# Patient Record
Sex: Male | Born: 1965
Health system: Southern US, Community
[De-identification: ages and names within clinical notes are randomized; demographics above are authoritative.]

## PROBLEM LIST (undated history)

## (undated) DIAGNOSIS — K219 Gastro-esophageal reflux disease without esophagitis: Secondary | ICD-10-CM

## (undated) DIAGNOSIS — R05 Cough: Secondary | ICD-10-CM

## (undated) DIAGNOSIS — B20 Human immunodeficiency virus [HIV] disease: Secondary | ICD-10-CM

## (undated) DIAGNOSIS — I739 Peripheral vascular disease, unspecified: Secondary | ICD-10-CM

## (undated) DIAGNOSIS — Z21 Asymptomatic human immunodeficiency virus [HIV] infection status: Secondary | ICD-10-CM

## (undated) HISTORY — DX: Peripheral vascular disease, unspecified: I73.9

---

## 2001-11-23 ENCOUNTER — Emergency Department (HOSPITAL_COMMUNITY): Admission: EM | Admit: 2001-11-23 | Discharge: 2001-11-24 | Payer: Self-pay | Admitting: Emergency Medicine

## 2001-11-23 ENCOUNTER — Encounter: Payer: Self-pay | Admitting: Emergency Medicine

## 2003-01-16 ENCOUNTER — Emergency Department (HOSPITAL_COMMUNITY): Admission: EM | Admit: 2003-01-16 | Discharge: 2003-01-16 | Payer: Self-pay | Admitting: Emergency Medicine

## 2003-03-23 ENCOUNTER — Emergency Department (HOSPITAL_COMMUNITY): Admission: EM | Admit: 2003-03-23 | Discharge: 2003-03-24 | Payer: Self-pay | Admitting: Emergency Medicine

## 2003-09-18 ENCOUNTER — Emergency Department (HOSPITAL_COMMUNITY): Admission: EM | Admit: 2003-09-18 | Discharge: 2003-09-19 | Payer: Self-pay | Admitting: Emergency Medicine

## 2003-09-18 ENCOUNTER — Encounter: Payer: Self-pay | Admitting: Emergency Medicine

## 2004-12-04 ENCOUNTER — Emergency Department (HOSPITAL_COMMUNITY): Admission: EM | Admit: 2004-12-04 | Discharge: 2004-12-04 | Payer: Self-pay | Admitting: Emergency Medicine

## 2007-10-16 ENCOUNTER — Emergency Department (HOSPITAL_COMMUNITY): Admission: EM | Admit: 2007-10-16 | Discharge: 2007-10-16 | Payer: Self-pay | Admitting: Emergency Medicine

## 2011-03-01 ENCOUNTER — Emergency Department (HOSPITAL_COMMUNITY)
Admission: EM | Admit: 2011-03-01 | Discharge: 2011-03-01 | Disposition: A | Discharge status: Home / Self Care | Payer: Self-pay | Attending: Emergency Medicine | Admitting: Emergency Medicine

## 2011-03-01 ENCOUNTER — Emergency Department (HOSPITAL_COMMUNITY): Payer: Self-pay

## 2011-03-01 DIAGNOSIS — R0602 Shortness of breath: Secondary | ICD-10-CM | POA: Insufficient documentation

## 2011-03-01 DIAGNOSIS — R197 Diarrhea, unspecified: Secondary | ICD-10-CM | POA: Insufficient documentation

## 2011-03-01 DIAGNOSIS — R112 Nausea with vomiting, unspecified: Secondary | ICD-10-CM | POA: Insufficient documentation

## 2011-03-01 DIAGNOSIS — R0989 Other specified symptoms and signs involving the circulatory and respiratory systems: Secondary | ICD-10-CM | POA: Insufficient documentation

## 2011-03-01 DIAGNOSIS — R1013 Epigastric pain: Secondary | ICD-10-CM | POA: Insufficient documentation

## 2011-03-01 DIAGNOSIS — R0609 Other forms of dyspnea: Secondary | ICD-10-CM | POA: Insufficient documentation

## 2011-03-01 DIAGNOSIS — R079 Chest pain, unspecified: Secondary | ICD-10-CM | POA: Insufficient documentation

## 2011-03-01 LAB — POCT I-STAT, CHEM 8
BUN: 8 mg/dL (ref 6–23)
Calcium, Ion: 1.04 mmol/L — ABNORMAL LOW (ref 1.12–1.32)
Chloride: 109 mEq/L (ref 96–112)
Creatinine, Ser: 1.2 mg/dL (ref 0.4–1.5)
Glucose, Bld: 119 mg/dL — ABNORMAL HIGH (ref 70–99)
HCT: 50 % (ref 39.0–52.0)
Hemoglobin: 17 g/dL (ref 13.0–17.0)
Potassium: 3.9 mEq/L (ref 3.5–5.1)
Sodium: 139 mEq/L (ref 135–145)
TCO2: 20 mmol/L (ref 0–100)

## 2011-03-01 LAB — POCT CARDIAC MARKERS
CKMB, poc: 2.4 ng/mL (ref 1.0–8.0)
Myoglobin, poc: 216 ng/mL (ref 12–200)

## 2011-03-01 LAB — HEPATIC FUNCTION PANEL
Albumin: 4.1 g/dL (ref 3.5–5.2)
Alkaline Phosphatase: 86 U/L (ref 39–117)
Bilirubin, Direct: 0.2 mg/dL (ref 0.0–0.3)
Total Bilirubin: 0.9 mg/dL (ref 0.3–1.2)

## 2011-03-01 LAB — LIPASE, BLOOD: Lipase: 29 U/L (ref 11–59)

## 2011-03-01 LAB — CBC
MCH: 31.6 pg (ref 26.0–34.0)
MCV: 89.5 fL (ref 78.0–100.0)
Platelets: 224 10*3/uL (ref 150–400)
RBC: 5.13 MIL/uL (ref 4.22–5.81)
RDW: 14.3 % (ref 11.5–15.5)

## 2011-03-01 LAB — DIFFERENTIAL
Basophils Relative: 0 % (ref 0–1)
Eosinophils Absolute: 0.2 10*3/uL (ref 0.0–0.7)
Eosinophils Relative: 2 % (ref 0–5)
Lymphs Abs: 3.8 10*3/uL (ref 0.7–4.0)
Monocytes Relative: 5 % (ref 3–12)
Neutrophils Relative %: 64 % (ref 43–77)

## 2011-06-22 ENCOUNTER — Emergency Department (HOSPITAL_COMMUNITY)
Admission: EM | Admit: 2011-06-22 | Discharge: 2011-06-23 | Disposition: A | Discharge status: Home / Self Care | Payer: Self-pay | Attending: Emergency Medicine | Admitting: Emergency Medicine

## 2011-06-22 ENCOUNTER — Emergency Department (HOSPITAL_COMMUNITY): Payer: Self-pay

## 2011-06-22 DIAGNOSIS — Z79899 Other long term (current) drug therapy: Secondary | ICD-10-CM | POA: Insufficient documentation

## 2011-06-22 DIAGNOSIS — R079 Chest pain, unspecified: Secondary | ICD-10-CM | POA: Insufficient documentation

## 2011-06-22 DIAGNOSIS — F17200 Nicotine dependence, unspecified, uncomplicated: Secondary | ICD-10-CM | POA: Insufficient documentation

## 2011-06-22 DIAGNOSIS — K219 Gastro-esophageal reflux disease without esophagitis: Secondary | ICD-10-CM | POA: Insufficient documentation

## 2011-06-22 LAB — DIFFERENTIAL
Basophils Relative: 0 % (ref 0–1)
Eosinophils Absolute: 0.1 10*3/uL (ref 0.0–0.7)
Eosinophils Relative: 0 % (ref 0–5)
Lymphs Abs: 2.6 10*3/uL (ref 0.7–4.0)
Monocytes Absolute: 0.6 10*3/uL (ref 0.1–1.0)
Monocytes Relative: 5 % (ref 3–12)

## 2011-06-22 LAB — POCT I-STAT, CHEM 8
BUN: 7 mg/dL (ref 6–23)
Calcium, Ion: 1.1 mmol/L — ABNORMAL LOW (ref 1.12–1.32)
Chloride: 103 mEq/L (ref 96–112)
Creatinine, Ser: 1.1 mg/dL (ref 0.50–1.35)
Glucose, Bld: 122 mg/dL — ABNORMAL HIGH (ref 70–99)
TCO2: 17 mmol/L (ref 0–100)

## 2011-06-22 LAB — CBC
MCH: 33.9 pg (ref 26.0–34.0)
MCHC: 36.4 g/dL — ABNORMAL HIGH (ref 30.0–36.0)
MCV: 93.2 fL (ref 78.0–100.0)
Platelets: 171 10*3/uL (ref 150–400)
RBC: 5.31 MIL/uL (ref 4.22–5.81)
RDW: 14.8 % (ref 11.5–15.5)

## 2011-06-22 LAB — TROPONIN I: Troponin I: <0.3 ng/mL (ref <0.30)

## 2011-08-03 ENCOUNTER — Emergency Department (HOSPITAL_COMMUNITY)
Admission: EM | Admit: 2011-08-03 | Discharge: 2011-08-04 | Disposition: A | Discharge status: Home / Self Care | Payer: Self-pay | Attending: Emergency Medicine | Admitting: Emergency Medicine

## 2011-08-03 DIAGNOSIS — K219 Gastro-esophageal reflux disease without esophagitis: Secondary | ICD-10-CM | POA: Insufficient documentation

## 2011-08-03 DIAGNOSIS — R112 Nausea with vomiting, unspecified: Secondary | ICD-10-CM | POA: Insufficient documentation

## 2011-08-03 DIAGNOSIS — Z79899 Other long term (current) drug therapy: Secondary | ICD-10-CM | POA: Insufficient documentation

## 2011-08-03 DIAGNOSIS — R109 Unspecified abdominal pain: Secondary | ICD-10-CM | POA: Insufficient documentation

## 2011-08-03 DIAGNOSIS — R61 Generalized hyperhidrosis: Secondary | ICD-10-CM | POA: Insufficient documentation

## 2011-08-03 DIAGNOSIS — R0609 Other forms of dyspnea: Secondary | ICD-10-CM | POA: Insufficient documentation

## 2011-08-03 DIAGNOSIS — R079 Chest pain, unspecified: Secondary | ICD-10-CM | POA: Insufficient documentation

## 2011-08-03 DIAGNOSIS — R0989 Other specified symptoms and signs involving the circulatory and respiratory systems: Secondary | ICD-10-CM | POA: Insufficient documentation

## 2011-08-04 ENCOUNTER — Emergency Department (HOSPITAL_COMMUNITY): Payer: Self-pay

## 2011-08-04 LAB — DIFFERENTIAL
Basophils Relative: 0 % (ref 0–1)
Lymphocytes Relative: 19 % (ref 12–46)
Monocytes Relative: 5 % (ref 3–12)
Neutro Abs: 8.9 10*3/uL — ABNORMAL HIGH (ref 1.7–7.7)
Neutrophils Relative %: 76 % (ref 43–77)

## 2011-08-04 LAB — CBC
HCT: 45.9 % (ref 39.0–52.0)
Hemoglobin: 16.6 g/dL (ref 13.0–17.0)
MCH: 34 pg (ref 26.0–34.0)
RBC: 4.88 MIL/uL (ref 4.22–5.81)

## 2011-08-04 LAB — COMPREHENSIVE METABOLIC PANEL
ALT: 16 U/L (ref 0–53)
Alkaline Phosphatase: 81 U/L (ref 39–117)
CO2: 23 mEq/L (ref 19–32)
Calcium: 9.3 mg/dL (ref 8.4–10.5)
GFR calc Af Amer: >60 mL/min (ref >60)
GFR calc non Af Amer: >60 mL/min (ref >60)
Glucose, Bld: 121 mg/dL — ABNORMAL HIGH (ref 70–99)
Potassium: 3.3 mEq/L — ABNORMAL LOW (ref 3.5–5.1)
Sodium: 131 mEq/L — ABNORMAL LOW (ref 135–145)

## 2011-08-05 ENCOUNTER — Emergency Department (HOSPITAL_COMMUNITY): Payer: Self-pay

## 2011-08-05 ENCOUNTER — Emergency Department (HOSPITAL_COMMUNITY)
Admission: EM | Admit: 2011-08-05 | Discharge: 2011-08-05 | Disposition: A | Discharge status: Home / Self Care | Payer: Self-pay | Attending: Emergency Medicine | Admitting: Emergency Medicine

## 2011-08-05 DIAGNOSIS — R112 Nausea with vomiting, unspecified: Secondary | ICD-10-CM | POA: Insufficient documentation

## 2011-08-05 DIAGNOSIS — R1013 Epigastric pain: Secondary | ICD-10-CM | POA: Insufficient documentation

## 2011-08-05 DIAGNOSIS — R197 Diarrhea, unspecified: Secondary | ICD-10-CM | POA: Insufficient documentation

## 2011-08-05 DIAGNOSIS — R509 Fever, unspecified: Secondary | ICD-10-CM | POA: Insufficient documentation

## 2011-08-05 DIAGNOSIS — R05 Cough: Secondary | ICD-10-CM | POA: Insufficient documentation

## 2011-08-05 DIAGNOSIS — R61 Generalized hyperhidrosis: Secondary | ICD-10-CM | POA: Insufficient documentation

## 2011-08-05 DIAGNOSIS — R059 Cough, unspecified: Secondary | ICD-10-CM | POA: Insufficient documentation

## 2011-08-05 DIAGNOSIS — K219 Gastro-esophageal reflux disease without esophagitis: Secondary | ICD-10-CM | POA: Insufficient documentation

## 2011-08-05 LAB — CBC
MCH: 31.8 pg (ref 26.0–34.0)
MCHC: 34 g/dL (ref 30.0–36.0)
MCV: 93.6 fL (ref 78.0–100.0)
Platelets: 159 10*3/uL (ref 150–400)
RDW: 14 % (ref 11.5–15.5)

## 2011-08-05 LAB — DIFFERENTIAL
Eosinophils Absolute: 0 10*3/uL (ref 0.0–0.7)
Eosinophils Relative: 0 % (ref 0–5)
Lymphs Abs: 2.6 10*3/uL (ref 0.7–4.0)
Monocytes Absolute: 0.6 10*3/uL (ref 0.1–1.0)
Monocytes Relative: 5 % (ref 3–12)

## 2011-08-05 LAB — COMPREHENSIVE METABOLIC PANEL
AST: 31 U/L (ref 0–37)
Albumin: 3.9 g/dL (ref 3.5–5.2)
CO2: 28 mEq/L (ref 19–32)
Calcium: 10.2 mg/dL (ref 8.4–10.5)
Creatinine, Ser: 0.93 mg/dL (ref 0.50–1.35)
GFR calc non Af Amer: >60 mL/min (ref >60)

## 2011-09-23 LAB — CBC
MCHC: 34.9
MCV: 92.7
Platelets: 164
RBC: 4.77

## 2011-09-23 LAB — BASIC METABOLIC PANEL
BUN: 11
CO2: 25
Chloride: 107
Creatinine, Ser: 1.04
Glucose, Bld: 106 — ABNORMAL HIGH

## 2011-09-23 LAB — DIFFERENTIAL
Basophils Relative: 0
Eosinophils Absolute: 0.3
Monocytes Relative: 7
Neutrophils Relative %: 67

## 2011-09-23 LAB — PROTIME-INR: Prothrombin Time: 12.9

## 2011-09-23 LAB — POCT CARDIAC MARKERS: CKMB, poc: <1 — ABNORMAL LOW

## 2012-12-08 IMAGING — CR DG ABDOMEN ACUTE W/ 1V CHEST
3 series · 3 of 3 positions shown · non-contrast
Comparison: Chest radiograph performed 06/22/2011

CLINICAL DATA: Nausea, vomiting and upper abdominal pain.

ACUTE ABDOMEN SERIES (ABDOMEN 2 VIEW & CHEST 1 VIEW)

[w chest pa]
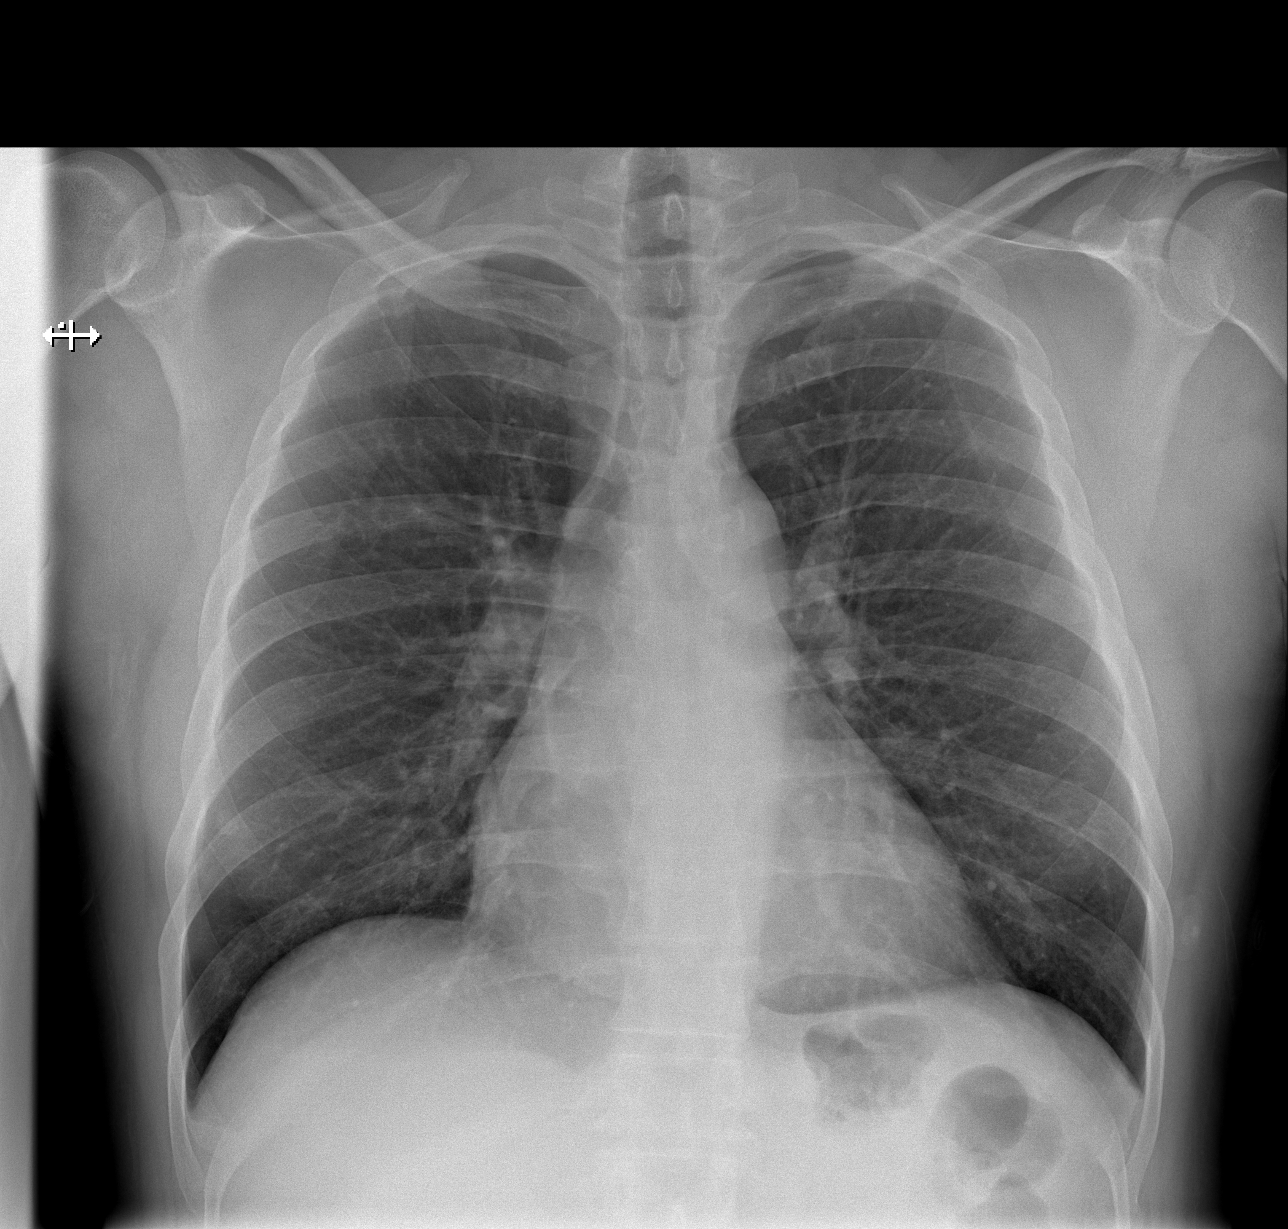

[w abdomen upright]
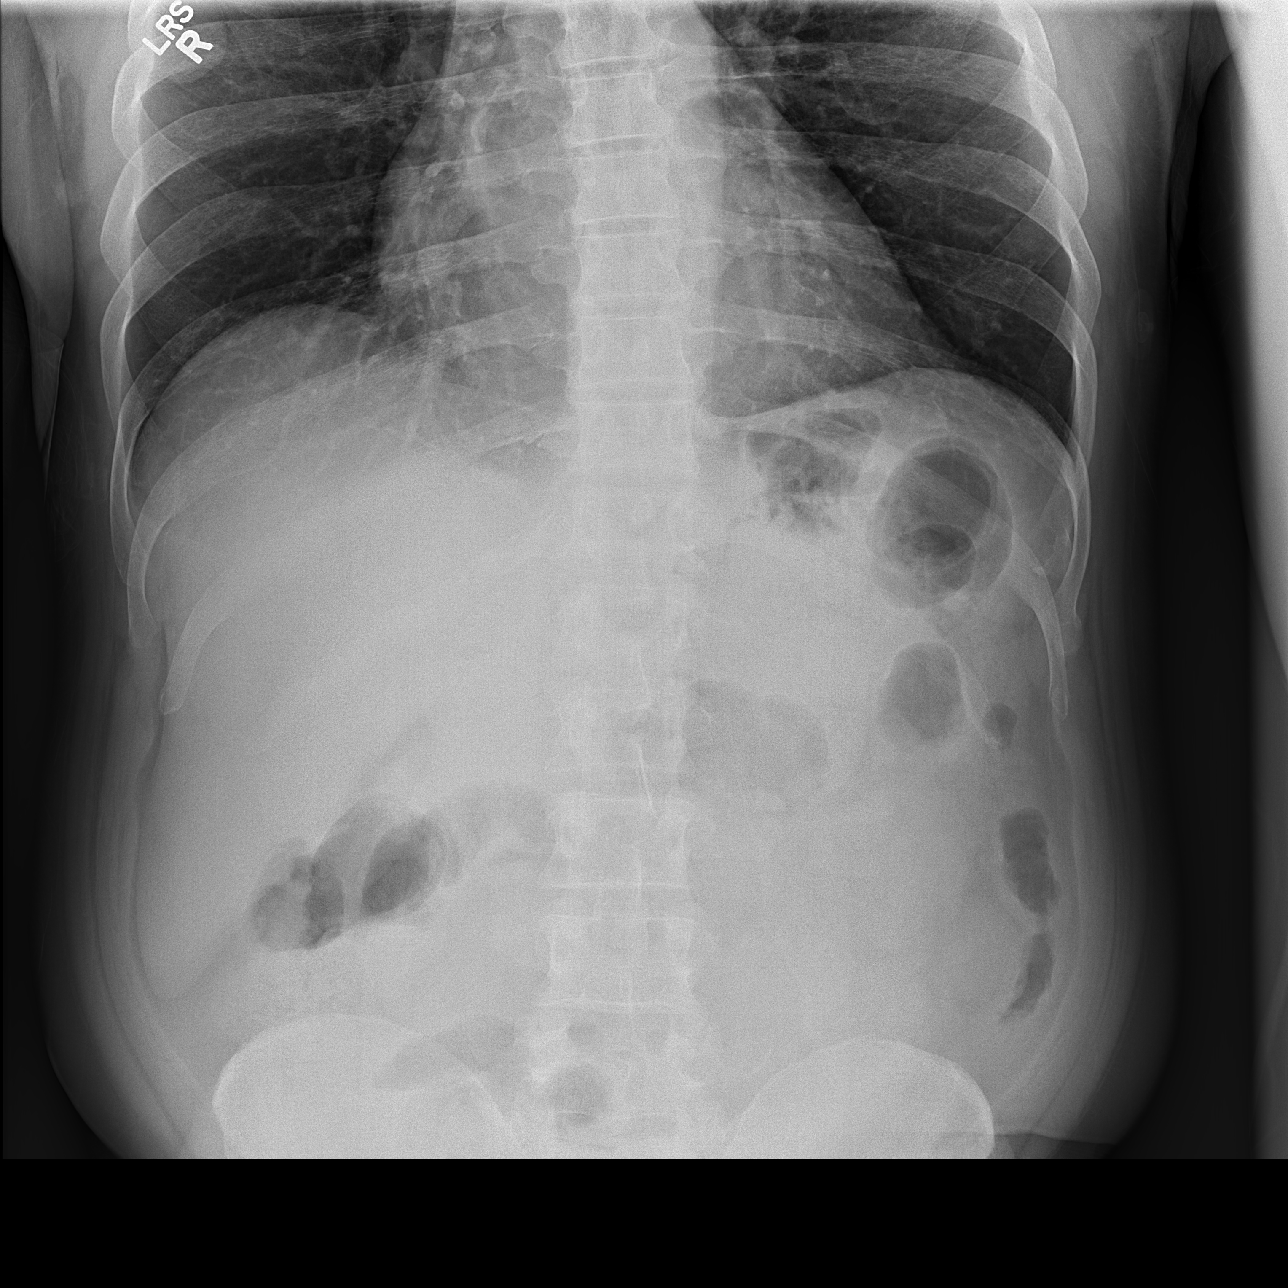

[t abdomen supine]
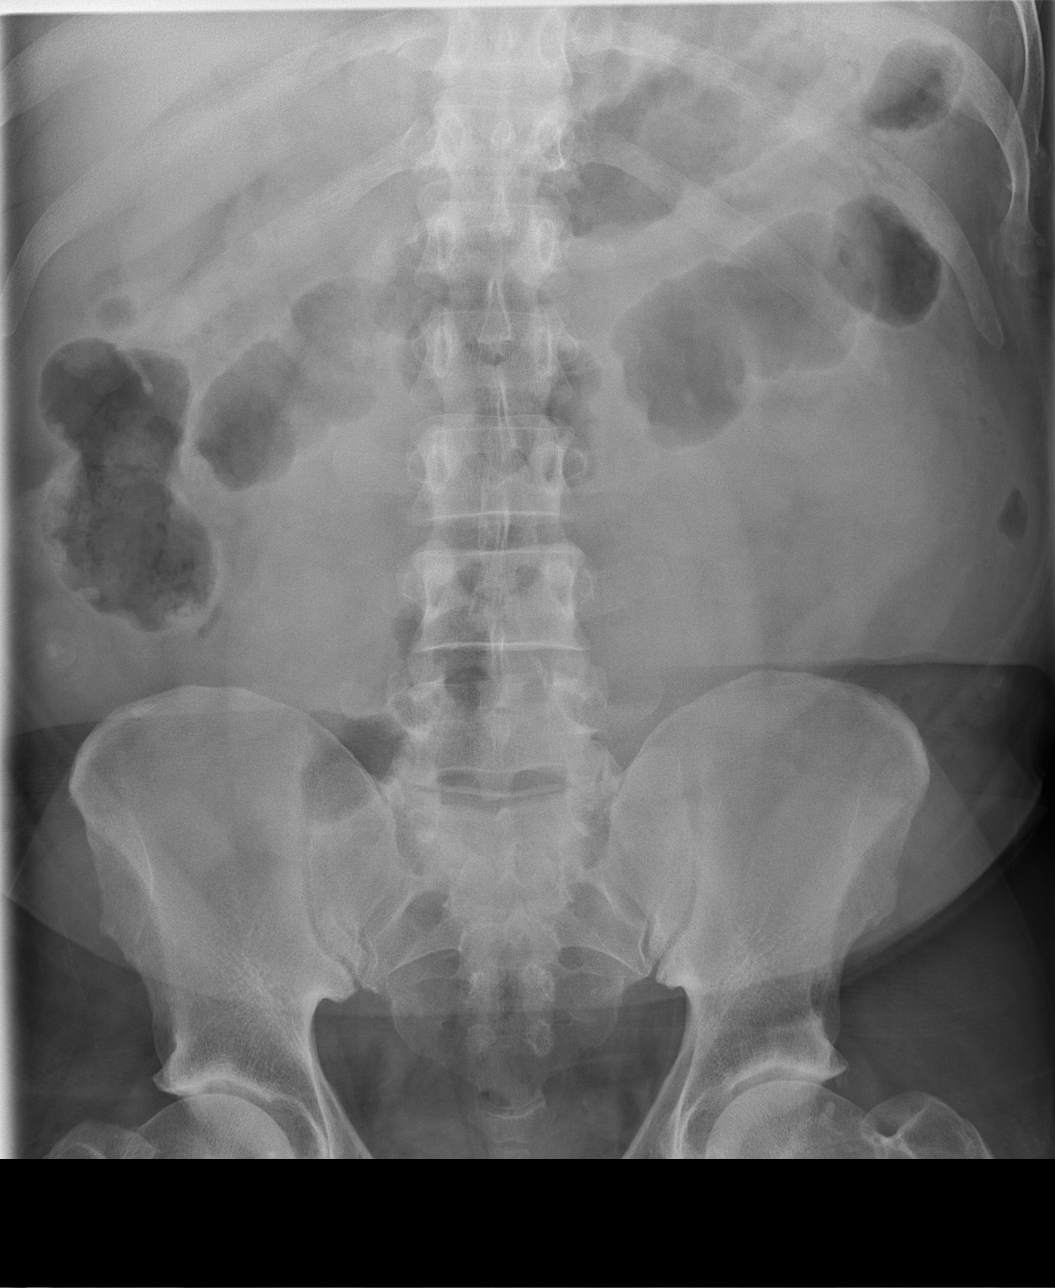

[3 of 3 positions shown; findings below may reference images not displayed]

FINDINGS: The lungs are well-aerated.  Mild peribronchial
thickening is noted.  There is no evidence of focal opacification,
pleural effusion or pneumothorax.  The cardiomediastinal silhouette
is within normal limits.  A rounded density at the right lung base
appears to reflect the right nipple.

The visualized bowel gas pattern is unremarkable.  Scattered air-
filled loops of small and large bowel are noted; there is no
evidence of small bowel dilatation to suggest obstruction.  No free
intra-abdominal air is identified on the provided upright view.

No acute osseous abnormalities are seen; the sacroiliac joints are
unremarkable in appearance.
IMPRESSION: 1.  Unremarkable bowel gas pattern; no free intra-abdominal air
seen.
2.  No acute cardiopulmonary process identified; mild peribronchial
thickening noted.

## 2013-05-26 ENCOUNTER — Emergency Department (HOSPITAL_COMMUNITY): Payer: Self-pay

## 2013-05-26 ENCOUNTER — Encounter (HOSPITAL_COMMUNITY): Payer: Self-pay | Admitting: Adult Health

## 2013-05-26 ENCOUNTER — Emergency Department (HOSPITAL_COMMUNITY)
Admission: EM | Admit: 2013-05-26 | Discharge: 2013-05-26 | Disposition: A | Discharge status: Home / Self Care | Payer: Self-pay | Attending: Emergency Medicine | Admitting: Emergency Medicine

## 2013-05-26 DIAGNOSIS — F17200 Nicotine dependence, unspecified, uncomplicated: Secondary | ICD-10-CM | POA: Insufficient documentation

## 2013-05-26 DIAGNOSIS — S6990XA Unspecified injury of unspecified wrist, hand and finger(s), initial encounter: Secondary | ICD-10-CM | POA: Insufficient documentation

## 2013-05-26 DIAGNOSIS — R209 Unspecified disturbances of skin sensation: Secondary | ICD-10-CM | POA: Insufficient documentation

## 2013-05-26 DIAGNOSIS — S59919A Unspecified injury of unspecified forearm, initial encounter: Secondary | ICD-10-CM | POA: Insufficient documentation

## 2013-05-26 DIAGNOSIS — Y9389 Activity, other specified: Secondary | ICD-10-CM | POA: Insufficient documentation

## 2013-05-26 DIAGNOSIS — S6991XA Unspecified injury of right wrist, hand and finger(s), initial encounter: Secondary | ICD-10-CM

## 2013-05-26 DIAGNOSIS — Y990 Civilian activity done for income or pay: Secondary | ICD-10-CM | POA: Insufficient documentation

## 2013-05-26 DIAGNOSIS — Y9289 Other specified places as the place of occurrence of the external cause: Secondary | ICD-10-CM | POA: Insufficient documentation

## 2013-05-26 DIAGNOSIS — X500XXA Overexertion from strenuous movement or load, initial encounter: Secondary | ICD-10-CM | POA: Insufficient documentation

## 2013-05-26 DIAGNOSIS — Z8719 Personal history of other diseases of the digestive system: Secondary | ICD-10-CM | POA: Insufficient documentation

## 2013-05-26 DIAGNOSIS — S59909A Unspecified injury of unspecified elbow, initial encounter: Secondary | ICD-10-CM | POA: Insufficient documentation

## 2013-05-26 HISTORY — DX: Gastro-esophageal reflux disease without esophagitis: K21.9

## 2013-05-26 MED ORDER — IBUPROFEN 200 MG PO TABS
400.0000 mg | ORAL_TABLET | Freq: Once | ORAL | Status: AC
  Administered 2013-05-26: 400 mg via ORAL
  Filled 2013-05-26: qty 2

## 2013-05-26 MED ORDER — NAPROXEN 500 MG PO TABS: 500.0000 mg | ORAL_TABLET | Freq: Two times a day (BID) | ORAL | Status: DC

## 2013-05-26 MED ORDER — OXYCODONE-ACETAMINOPHEN 5-325 MG PO TABS
1.0000 | ORAL_TABLET | Freq: Once | ORAL | Status: AC
  Administered 2013-05-26: 1 via ORAL
  Filled 2013-05-26: qty 1

## 2013-05-26 MED ORDER — HYDROCODONE-ACETAMINOPHEN 5-325 MG PO TABS: 1.0000 | ORAL_TABLET | ORAL | Status: DC | PRN

## 2013-05-26 NOTE — ED Notes (Signed)
Presents with right wrist injury, wrist injury occurred today while using a wheel barrel and pt staes, "I felt my wrist pop" CMS intact, pt able to move wrist well. Deformity noted to anterior surface.

## 2013-05-26 NOTE — ED Notes (Signed)
The pt injured his wrist this am at work  Swelling and pain to the rt wrist palmar surface

## 2013-05-26 NOTE — Progress Notes (Signed)
Orthopedic Tech Progress Note Patient Details:  Gregory Grant [birth year] [id number]  Ortho Devices Type of Ortho Device: Thumb velcro splint Ortho Device/Splint Location: RUE Ortho Device/Splint Interventions: Application;Ordered   Jennye Moccasin 05/26/2013, 10:30 PM

## 2013-05-26 NOTE — ED Provider Notes (Signed)
History    This chart was scribed for non-physician practitioner Gregory Grant working with Gregory Human, MD by Gregory Grant, ED Scribe. This patient was seen in room TR09C/TR09C and the patient's care was started at 10:20 PM .   CSN: [id number]  Arrival date & time 05/26/13  2055      Chief Complaint  Patient presents with  . Wrist Injury     The history is provided by the patient. No language interpreter was used.    HPI Comments: Gregory Grant is a 47 y.o. male who presents to the Emergency Department complaining of a right wrist injury that he sustained 12 hours ago.  Pt states that at that time he was pulling a wheelbarrow full of dirt and felt the wrist pop.  4 hours later he noted a visible knot to the right wrist.  Presently he reports pain and tingling to the wrist that is exacerbated by moving the hand.  He denies h/o injury to that wrist.  He denies chronic medical conditions except for GERD and denies regular medicine usage.  He notes that he saw an orthopedist some time ago for a fractured finger to the right hand.   Past Medical History  Diagnosis Date  . GERD (gastroesophageal reflux disease)     History reviewed. No pertinent past surgical history.  History reviewed. No pertinent family history.  History  Substance Use Topics  . Smoking status: Current Every Day Smoker    Types: Cigarettes  . Smokeless tobacco: Not on file  . Alcohol Use: Yes      Review of Systems  HENT: Negative for neck pain and neck stiffness.   Musculoskeletal: Positive for arthralgias. Negative for back pain and joint swelling.  Skin: Negative for wound.  Neurological: Negative for numbness.  All other systems reviewed and are negative.    Allergies  Review of patient's allergies indicates no known allergies.  Home Medications   Current Outpatient Rx  Name  Route  Sig  Dispense  Refill  . HYDROcodone-acetaminophen (NORCO/VICODIN) 5-325 MG per tablet    Oral   Take 1 tablet by mouth every 4 (four) hours as needed for pain.   9 tablet   0   . naproxen (NAPROSYN) 500 MG tablet   Oral   Take 1 tablet (500 mg total) by mouth 2 (two) times daily with a meal.   30 tablet   0     BP 139/90  Pulse 89  Temp(Src) 98.7 F (37.1 C) (Oral)  Resp 16  SpO2 98%  Physical Exam  Nursing note and vitals reviewed. Constitutional: He appears well-developed and well-nourished. No distress.  HENT:  Head: Normocephalic and atraumatic.  Eyes: Conjunctivae are normal.  Neck: Normal range of motion.  Cardiovascular: Normal rate, regular rhythm and intact distal pulses.   Capillary refill <3 seconds  Pulmonary/Chest: Effort normal and breath sounds normal.  Musculoskeletal: He exhibits tenderness. He exhibits no edema.  Neurovascularly intact to radial, median and ulnar nerves. Full ROM to fingers Full supination and pronation but with pain Full ROM of the elbow Full flexion and extension of the wrist Full adduction and abduction of the wrist  Neurological: He is alert. Coordination normal.  Sensation intact Strength 5/5  Skin: Skin is warm and dry. He is not diaphoretic.  No tenting of the skin. Palpable knot along the tendon sheath over distal radius, tender to palpation.  Psychiatric: He has a normal mood and affect.    ED Course  Procedures (including critical care time)  DIAGNOSTIC STUDIES: Oxygen Saturation is 98% on room air, normal by my interpretation.    COORDINATION OF CARE: 10:25 PM-Discussed treatment plan which includes wrist splint and f/u with hand surgeon with pt at bedside and pt agreed to plan.     Labs Reviewed - No data to display Dg Wrist Complete Right  05/26/2013   *RADIOLOGY REPORT*  Clinical Data: Right wrist injury.  Pain in the carpal region.  RIGHT WRIST - COMPLETE 3+ VIEW  Comparison: None.  Findings: The right wrist appears intact. No evidence of acute fracture or subluxation.  No focal bone lesions.   Bone matrix and cortex appear intact.  No abnormal radiopaque densities in the soft tissues.  IMPRESSION: No acute bony abnormalities demonstrated in the right wrist.   Original Report Authenticated By: Burman Nieves, M.D.     1. Wrist injury, right, initial encounter       MDM  Gregory Grant presents with wrist pain and possible tendon rupture.  Patient X-Ray negative for obvious fracture or dislocation. I personally reviewed the imaging tests through PACS system.  I reviewed available ER/hospitalization records through the EMR.  Pain managed in ED. Pt advised to follow up with orthopedics for tendon evaluation Patient given thumb spica brace while in ED, conservative therapy recommended and discussed. Patient will be dc home & is agreeable with above plan. I have also discussed reasons to return immediately to the ER.  Patient expresses understanding and agrees with plan.  I personally performed the services described in this documentation, which was scribed in my presence. The recorded information has been reviewed and is accurate.    Gregory Client Roshawna Colclasure, PA-C 05/26/13 2232

## 2013-05-27 NOTE — ED Provider Notes (Signed)
Medical screening examination/treatment/procedure(s) were performed by non-physician practitioner and as supervising physician I was immediately available for consultation/collaboration.   Carleene Cooper III, MD 05/27/13 [phone]

## 2013-10-15 ENCOUNTER — Emergency Department (HOSPITAL_COMMUNITY)
Admission: EM | Admit: 2013-10-15 | Discharge: 2013-10-15 | Disposition: A | Discharge status: Home / Self Care | Payer: Self-pay | Attending: Emergency Medicine | Admitting: Emergency Medicine

## 2013-10-15 ENCOUNTER — Encounter (HOSPITAL_COMMUNITY): Payer: Self-pay | Admitting: Emergency Medicine

## 2013-10-15 DIAGNOSIS — M25559 Pain in unspecified hip: Secondary | ICD-10-CM | POA: Insufficient documentation

## 2013-10-15 DIAGNOSIS — M5430 Sciatica, unspecified side: Secondary | ICD-10-CM | POA: Insufficient documentation

## 2013-10-15 DIAGNOSIS — F17200 Nicotine dependence, unspecified, uncomplicated: Secondary | ICD-10-CM | POA: Insufficient documentation

## 2013-10-15 DIAGNOSIS — Z79899 Other long term (current) drug therapy: Secondary | ICD-10-CM | POA: Insufficient documentation

## 2013-10-15 DIAGNOSIS — M5432 Sciatica, left side: Secondary | ICD-10-CM

## 2013-10-15 DIAGNOSIS — K219 Gastro-esophageal reflux disease without esophagitis: Secondary | ICD-10-CM | POA: Insufficient documentation

## 2013-10-15 MED ORDER — IBUPROFEN 800 MG PO TABS: 800.0000 mg | ORAL_TABLET | Freq: Three times a day (TID) | ORAL | Status: DC

## 2013-10-15 MED ORDER — DIAZEPAM 5 MG PO TABS: 5.0000 mg | ORAL_TABLET | Freq: Four times a day (QID) | ORAL | Status: DC | PRN

## 2013-10-15 MED ORDER — DIAZEPAM 5 MG/ML IJ SOLN
5.0000 mg | Freq: Once | INTRAMUSCULAR | Status: AC
  Administered 2013-10-15: 5 mg via INTRAVENOUS
  Filled 2013-10-15: qty 2

## 2013-10-15 MED ORDER — HYDROMORPHONE HCL PF 1 MG/ML IJ SOLN
1.0000 mg | Freq: Once | INTRAMUSCULAR | Status: AC
  Administered 2013-10-15: 1 mg via INTRAMUSCULAR
  Filled 2013-10-15: qty 1

## 2013-10-15 MED ORDER — KETOROLAC TROMETHAMINE 60 MG/2ML IM SOLN
60.0000 mg | Freq: Once | INTRAMUSCULAR | Status: AC
  Administered 2013-10-15: 60 mg via INTRAMUSCULAR
  Filled 2013-10-15: qty 2

## 2013-10-15 MED ORDER — OXYCODONE-ACETAMINOPHEN 5-325 MG PO TABS: 2.0000 | ORAL_TABLET | Freq: Four times a day (QID) | ORAL | Status: DC | PRN

## 2013-10-15 NOTE — ED Notes (Signed)
Mother to drive patient home

## 2013-10-15 NOTE — ED Notes (Signed)
MD at bedside. 

## 2013-10-15 NOTE — ED Provider Notes (Signed)
CSN: [id number]     Arrival date & time 10/15/13  0444 History   First MD Initiated Contact with Patient 10/15/13 0447     Chief Complaint  Patient presents with  . Back Pain   (Consider location/radiation/quality/duration/timing/severity/associated sxs/prior Treatment) The history is provided by the patient.  Gregory Grant is a 47 y.o. male here with left hip pain. He works as a Medical sales representative heavy items. For the last week he's been having back pain that radiated down to his left leg. He tried to take his mothers hydrocodone with minimal relief. Denies any incontinence or numbness. Denies any back injury.    Past Medical History  Diagnosis Date  . GERD (gastroesophageal reflux disease)    History reviewed. No pertinent past surgical history. History reviewed. No pertinent family history. History  Substance Use Topics  . Smoking status: Current Every Day Smoker -- 1.00 packs/day for 30 years    Types: Cigarettes  . Smokeless tobacco: Not on file  . Alcohol Use: 6.0 oz/week    10 Cans of beer per week    Review of Systems  Musculoskeletal: Positive for back pain.  All other systems reviewed and are negative.    Allergies  Review of patient's allergies indicates no known allergies.  Home Medications   Current Outpatient Rx  Name  Route  Sig  Dispense  Refill  . naproxen (NAPROSYN) 500 MG tablet   Oral   Take 1 tablet (500 mg total) by mouth 2 (two) times daily with a meal.   30 tablet   0    BP 135/86  Pulse 82  Temp(Src) 98.3 F (36.8 C) (Oral)  Resp 18  Ht 5\' 9"  (1.753 m)  Wt 160 lb (72.576 kg)  BMI 23.62 kg/m2  SpO2 100% Physical Exam  Nursing note and vitals reviewed. Constitutional: He is oriented to person, place, and time. He appears well-developed.  Uncomfortable   HENT:  Head: Normocephalic.  Eyes: Pupils are equal, round, and reactive to light.  Neck: Normal range of motion.  Cardiovascular: Normal rate, regular rhythm and normal  heart sounds.   Pulmonary/Chest: Effort normal and breath sounds normal. No respiratory distress. He has no wheezes. He has no rales.  Abdominal: Soft. Bowel sounds are normal. He exhibits no distension. There is no tenderness. There is no rebound.  Musculoskeletal: Normal range of motion.  No midline spinal tenderness   Neurological: He is alert and oriented to person, place, and time.  No saddle anesthesia. Positive straight leg raise on L side.   Skin: Skin is warm and dry.  Psychiatric: He has a normal mood and affect. His behavior is normal. Judgment and thought content normal.    ED Course  Procedures (including critical care time) Labs Review Labs Reviewed - No data to display Imaging Review No results found.  EKG Interpretation   None       MDM  No diagnosis found. Gregory Grant is a 47 y.o. male here with sciatica. No red flags requiring imaging. Given toradol, dilaudid, valium, felt better. Will d/c home on naprosyn, percocet, valium. Will refer to ortho for possible back injection.     Richardean Canal, MD 10/15/13 0600

## 2013-10-15 NOTE — Discharge Instructions (Signed)
Take motrin 800 mg every 6 hrs for pain.   Take valium or percocet for severe pain.   Follow up with orthopedic doctor for possible steroid injection.   Return to ER if you have severe pain, unable to walk, numbness, weakness, incontinence.

## 2013-10-15 NOTE — ED Notes (Signed)
Pt c/o left hip pain that radiates down to left knee, started last Thursday and has gradually worsened over the past week.  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

## 2014-04-26 ENCOUNTER — Other Ambulatory Visit: Payer: Self-pay

## 2014-04-26 ENCOUNTER — Encounter (HOSPITAL_COMMUNITY): Payer: Self-pay | Admitting: Emergency Medicine

## 2014-04-26 ENCOUNTER — Emergency Department (HOSPITAL_COMMUNITY)
Admission: EM | Admit: 2014-04-26 | Discharge: 2014-04-26 | Disposition: A | Discharge status: Home / Self Care | Payer: Self-pay | Attending: Emergency Medicine | Admitting: Emergency Medicine

## 2014-04-26 ENCOUNTER — Emergency Department (HOSPITAL_COMMUNITY): Payer: Self-pay

## 2014-04-26 DIAGNOSIS — F17200 Nicotine dependence, unspecified, uncomplicated: Secondary | ICD-10-CM | POA: Insufficient documentation

## 2014-04-26 DIAGNOSIS — K219 Gastro-esophageal reflux disease without esophagitis: Secondary | ICD-10-CM | POA: Insufficient documentation

## 2014-04-26 DIAGNOSIS — R112 Nausea with vomiting, unspecified: Secondary | ICD-10-CM

## 2014-04-26 DIAGNOSIS — R079 Chest pain, unspecified: Secondary | ICD-10-CM | POA: Insufficient documentation

## 2014-04-26 DIAGNOSIS — Z791 Long term (current) use of non-steroidal anti-inflammatories (NSAID): Secondary | ICD-10-CM | POA: Insufficient documentation

## 2014-04-26 DIAGNOSIS — R6883 Chills (without fever): Secondary | ICD-10-CM | POA: Insufficient documentation

## 2014-04-26 DIAGNOSIS — R61 Generalized hyperhidrosis: Secondary | ICD-10-CM | POA: Insufficient documentation

## 2014-04-26 LAB — COMPREHENSIVE METABOLIC PANEL
ALBUMIN: 3.6 g/dL (ref 3.5–5.2)
ALT: 14 U/L (ref 0–53)
AST: 25 U/L (ref 0–37)
Alkaline Phosphatase: 85 U/L (ref 39–117)
BUN: 8 mg/dL (ref 6–23)
CO2: 19 mEq/L (ref 19–32)
CREATININE: 1 mg/dL (ref 0.50–1.35)
Calcium: 9.1 mg/dL (ref 8.4–10.5)
Chloride: 100 mEq/L (ref 96–112)
GFR calc Af Amer: >90 mL/min (ref >90)
GFR calc non Af Amer: 89 mL/min — ABNORMAL LOW (ref >90)
Glucose, Bld: 97 mg/dL (ref 70–99)
Potassium: 3.6 mEq/L — ABNORMAL LOW (ref 3.7–5.3)
Sodium: 139 mEq/L (ref 137–147)
TOTAL PROTEIN: 7 g/dL (ref 6.0–8.3)
Total Bilirubin: 0.3 mg/dL (ref 0.3–1.2)

## 2014-04-26 LAB — I-STAT TROPONIN, ED
TROPONIN I, POC: 0 ng/mL (ref 0.00–0.08)
Troponin i, poc: 0 ng/mL (ref 0.00–0.08)

## 2014-04-26 LAB — CBC
HEMATOCRIT: 43 % (ref 39.0–52.0)
Hemoglobin: 15 g/dL (ref 13.0–17.0)
MCH: 32.8 pg (ref 26.0–34.0)
MCHC: 34.9 g/dL (ref 30.0–36.0)
MCV: 94.1 fL (ref 78.0–100.0)
PLATELETS: 187 10*3/uL (ref 150–400)
RBC: 4.57 MIL/uL (ref 4.22–5.81)
RDW: 14.3 % (ref 11.5–15.5)
WBC: 7.3 10*3/uL (ref 4.0–10.5)

## 2014-04-26 MED ORDER — GI COCKTAIL ~~LOC~~
30.0000 mL | Freq: Once | ORAL | Status: AC
  Administered 2014-04-26: 30 mL via ORAL
  Filled 2014-04-26: qty 30

## 2014-04-26 MED ORDER — METOCLOPRAMIDE HCL 5 MG/ML IJ SOLN
10.0000 mg | Freq: Once | INTRAMUSCULAR | Status: AC
  Administered 2014-04-26: 10 mg via INTRAVENOUS
  Filled 2014-04-26: qty 2

## 2014-04-26 MED ORDER — OMEPRAZOLE 20 MG PO CPDR: 20.0000 mg | DELAYED_RELEASE_CAPSULE | Freq: Every day | ORAL | Status: DC

## 2014-04-26 MED ORDER — SODIUM CHLORIDE 0.9 % IV BOLUS (SEPSIS)
1000.0000 mL | Freq: Once | INTRAVENOUS | Status: AC
  Administered 2014-04-26: 1000 mL via INTRAVENOUS

## 2014-04-26 MED ORDER — ONDANSETRON HCL 4 MG/2ML IJ SOLN
4.0000 mg | Freq: Once | INTRAMUSCULAR | Status: AC
  Administered 2014-04-26: 4 mg via INTRAVENOUS
  Filled 2014-04-26: qty 2

## 2014-04-26 MED ORDER — NITROGLYCERIN 0.4 MG SL SUBL
0.4000 mg | SUBLINGUAL_TABLET | SUBLINGUAL | Status: DC | PRN
  Administered 2014-04-26 (×2): 0.4 mg via SUBLINGUAL
  Filled 2014-04-26 (×2): qty 1

## 2014-04-26 MED ORDER — MORPHINE SULFATE 4 MG/ML IJ SOLN
4.0000 mg | Freq: Once | INTRAMUSCULAR | Status: AC
  Administered 2014-04-26: 4 mg via INTRAVENOUS
  Filled 2014-04-26: qty 1

## 2014-04-26 NOTE — ED Provider Notes (Signed)
Medical screening examination/treatment/procedure(s) were conducted as a shared visit with non-physician practitioner(s) and myself.  I personally evaluated the patient during the encounter.   EKG Interpretation None       Toy BakerAnthony T Mirtie Bastyr, MD 04/26/14 2318

## 2014-04-26 NOTE — ED Provider Notes (Signed)
CSN: [id number]     Arrival date & time 04/26/14  1814 History   First MD Initiated Contact with Patient 04/26/14 1814     Chief Complaint  Patient presents with  . Chest Pain     (Consider location/radiation/quality/duration/timing/severity/associated sxs/prior Treatment) The history is provided by the patient, medical records and the EMS personnel. No language interpreter was used.    Gregory Grant is a 48 y.o. male  with a hx of GERD presents to the Emergency Department complaining of gradual, persistent, progressively worsening chest pain onset 10 am after awakening. Patient reports associated nausea and emesis x4 and chills. Emesis is nonbloody nonbilious. He denies diarrhea. Patient reports that the chest pain has worsened throughout the day. He has a history of GERD and reports symptoms similar to this in the past relieved by GI cocktail. Patient given aspirin and nitroglycerin x1 via EMS without change in his chest pain.  Patient denies aggravating or alleviating symptoms. He denies fever, chills, headache, neck pain, chest pain, shortness of breath, abdominal pain, diarrhea, weakness and dizziness, syncope, dysuria. Patient is an every day smoker (1 pack per day x30 years) and drinks approximately 10 cans of beer per week.  Patient denies personal or family history of cardiac disease.  He has no cardiac risk factors.  Past Medical History  Diagnosis Date  . GERD (gastroesophageal reflux disease)    History reviewed. No pertinent past surgical history. History reviewed. No pertinent family history. History  Substance Use Topics  . Smoking status: Current Every Day Smoker -- 1.00 packs/day for 30 years    Types: Cigarettes  . Smokeless tobacco: Not on file  . Alcohol Use: 6.0 oz/week    10 Cans of beer per week    Review of Systems  Constitutional: Positive for chills and diaphoresis. Negative for fever, appetite change, fatigue and unexpected weight change.  HENT: Negative  for mouth sores.   Eyes: Negative for visual disturbance.  Respiratory: Negative for cough, chest tightness, shortness of breath and wheezing.   Cardiovascular: Positive for chest pain.  Gastrointestinal: Positive for nausea and vomiting. Negative for abdominal pain, diarrhea and constipation.  Endocrine: Negative for polydipsia, polyphagia and polyuria.  Genitourinary: Negative for dysuria, urgency, frequency and hematuria.  Musculoskeletal: Negative for back pain and neck stiffness.  Skin: Negative for rash.  Allergic/Immunologic: Negative for immunocompromised state.  Neurological: Negative for syncope, light-headedness and headaches.  Hematological: Does not bruise/bleed easily.  Psychiatric/Behavioral: Negative for sleep disturbance. The patient is not nervous/anxious.       Allergies  Review of patient's allergies indicates no known allergies.  Home Medications   Prior to Admission medications   Medication Sig Start Date End Date Taking? Authorizing Provider  diazepam (VALIUM) 5 MG tablet Take 1 tablet (5 mg total) by mouth every 6 (six) hours as needed (spasms). 10/15/13   Richardean Canal, MD  ibuprofen (ADVIL,MOTRIN) 800 MG tablet Take 1 tablet (800 mg total) by mouth 3 (three) times daily. 10/15/13   Richardean Canal, MD  oxyCODONE-acetaminophen (PERCOCET) 5-325 MG per tablet Take 2 tablets by mouth every 6 (six) hours as needed for pain. 10/15/13   Richardean Canal, MD   BP 114/64  Pulse 54  Temp(Src) 98.7 F (37.1 C) (Rectal)  Resp 16  Ht 6' (1.829 m)  Wt 211 lb (95.709 kg)  BMI 28.61 kg/m2  SpO2 97% Physical Exam  Nursing note and vitals reviewed. Constitutional: He is oriented to person, place, and time. He appears  well-developed and well-nourished. He appears distressed.  Awake, alert, Patient diaphoretic and vomiting, uncomfortable appearing  HENT:  Head: Normocephalic and atraumatic.  Mouth/Throat: Oropharynx is clear and moist. No oropharyngeal exudate.  Eyes:  Conjunctivae are normal. No scleral icterus.  Neck: Normal range of motion. Neck supple.  Cardiovascular: Normal rate, regular rhythm, normal heart sounds and intact distal pulses.   No murmur heard. Pulmonary/Chest: Effort normal and breath sounds normal. No respiratory distress. He has no wheezes. He has no rales.  Abdominal: Soft. Bowel sounds are normal. He exhibits no mass. There is no tenderness. There is no rebound and no guarding.  Musculoskeletal: Normal range of motion. He exhibits no edema.  Lymphadenopathy:    He has no cervical adenopathy.  Neurological: He is alert and oriented to person, place, and time. He exhibits normal muscle tone. Coordination normal.  Speech is clear and goal oriented Moves extremities without ataxia  Skin: Skin is warm. He is diaphoretic. No erythema.  Psychiatric: He has a normal mood and affect.    ED Course  Procedures (including critical care time) Labs Review Labs Reviewed  COMPREHENSIVE METABOLIC PANEL - Abnormal; Notable for the following:    Potassium 3.6 (*)    GFR calc non Af Amer 89 (*)    All other components within normal limits  CBC  I-STAT TROPOININ, ED  Rosezena SensorI-STAT TROPOININ, ED    Imaging Review Dg Chest 2 View  04/26/2014   CLINICAL DATA:  Chest pain and shortness of breath.  EXAM: CHEST  2 VIEW  COMPARISON:  PA and lateral chest 08/05/2011.  FINDINGS: Lungs are clear. Heart size is normal. No pneumothorax or pleural fluid.  IMPRESSION: Negative chest.   Electronically Signed   By: Drusilla Kannerhomas  Dalessio M.D.   On: 04/26/2014 20:08     EKG Interpretation None      MDM   Final diagnoses:  Chest pain  GERD (gastroesophageal reflux disease)  Nausea and vomiting   [name] presents with chest pain, onset this AM and associated with N/V.  Patient reports the symptoms are similar to previous reflux problems.  We'll assess for ACS, control nausea and reassess.  7:30PM Patient continues to vomit. Will give Reglan.  Patient  is less diaphoretic but continues to be uncomfortable.  9:30PM Patient with improving chest pain. He is no longer vomiting. Will obtain a delta troponin and begin by mouth trial.     10:45PM Reason tolerating by mouth without difficulty. He reports that he continues to feel better.  11:03 PM Chest pain is not likely of cardiac or pulmonary etiology d/t presentation, PERC negative, VSS, no tracheal deviation, no JVD or new murmur, RRR, breath sounds equal bilaterally, EKG without acute abnormalities, negative troponin, and negative CXR. Pt has been advised restart a PPI and return to the ED if CP becomes exertional, associated with diaphoresis or nausea, radiates to left jaw/arm, worsens or becomes concerning in any way. Pt appears reliable for follow up and is agreeable to discharge.   Case has been discussed with and seen by Dr. Bruce Donathony Allen who agrees with the above plan to discharge.   It has been determined that no acute conditions requiring further emergency intervention are present at this time. The patient/guardian have been advised of the diagnosis and plan. We have discussed signs and symptoms that warrant return to the ED, such as changes or worsening in symptoms.   Vital signs are stable at discharge.   BP 114/64  Pulse 54  Temp(Src)  98.7 F (37.1 C) (Rectal)  Resp 16  Ht 6' (1.829 m)  Wt 211 lb (95.709 kg)  BMI 28.61 kg/m2  SpO2 97%  Patient/guardian has voiced understanding and agreed to follow-up with the PCP or specialist.      Dierdre ForthHannah Moe Graca, PA-C 04/26/14 2316

## 2014-04-26 NOTE — ED Notes (Signed)
Per EMS, pt started having chest pain this am with chills and vomiting. EMS initiated IV in L hand, administered nitro x 1 and 324 aspirin prior to arrival. CBG pre-arrival was 60

## 2014-04-26 NOTE — ED Provider Notes (Signed)
Medical screening examination/treatment/procedure(s) were conducted as a shared visit with non-physician practitioner(s) and myself.  I personally evaluated the patient during the encounter.   EKG Interpretation None     Pt here with sx similar to his prior gerd, delta trop neg, meds for reflux given, pt stable for d/c  Toy BakerAnthony T [employer]elle Corp, MD 04/26/14 2308

## 2014-04-26 NOTE — ED Notes (Signed)
Pt states he is chest pain free.  

## 2014-04-26 NOTE — Discharge Instructions (Signed)
1. Medications: omeprazole, usual home medications 2. Treatment: rest, drink plenty of fluids,  3. Follow Up: Please followup with your primary doctor for discussion of your diagnoses and further evaluation after today's visit; if you do not have a primary care doctor use the resource guide provided to find one;    Gastroesophageal Reflux Disease, Adult Gastroesophageal reflux disease (GERD) happens when acid from your stomach flows up into the esophagus. When acid comes in contact with the esophagus, the acid causes soreness (inflammation) in the esophagus. Over time, GERD may create small holes (ulcers) in the lining of the esophagus. CAUSES   Increased body weight. This puts pressure on the stomach, making acid rise from the stomach into the esophagus.  Smoking. This increases acid production in the stomach.  Drinking alcohol. This causes decreased pressure in the lower esophageal sphincter (valve or ring of muscle between the esophagus and stomach), allowing acid from the stomach into the esophagus.  Late evening meals and a full stomach. This increases pressure and acid production in the stomach.  A malformed lower esophageal sphincter. Sometimes, no cause is found. SYMPTOMS   Burning pain in the lower part of the mid-chest behind the breastbone and in the mid-stomach area. This may occur twice a week or more often.  Trouble swallowing.  Sore throat.  Dry cough.  Asthma-like symptoms including chest tightness, shortness of breath, or wheezing. DIAGNOSIS  Your caregiver may be able to diagnose GERD based on your symptoms. In some cases, X-rays and other tests may be done to check for complications or to check the condition of your stomach and esophagus. TREATMENT  Your caregiver may recommend over-the-counter or prescription medicines to help decrease acid production. Ask your caregiver before starting or adding any new medicines.  HOME CARE INSTRUCTIONS   Change the factors  that you can control. Ask your caregiver for guidance concerning weight loss, quitting smoking, and alcohol consumption.  Avoid foods and drinks that make your symptoms worse, such as:  Caffeine or alcoholic drinks.  Chocolate.  Peppermint or mint flavorings.  Garlic and onions.  Spicy foods.  Citrus fruits, such as oranges, lemons, or limes.  Tomato-based foods such as sauce, chili, salsa, and pizza.  Fried and fatty foods.  Avoid lying down for the 3 hours prior to your bedtime or prior to taking a nap.  Eat small, frequent meals instead of large meals.  Wear loose-fitting clothing. Do not wear anything tight around your waist that causes pressure on your stomach.  Raise the head of your bed 6 to 8 inches with wood blocks to help you sleep. Extra pillows will not help.  Only take over-the-counter or prescription medicines for pain, discomfort, or fever as directed by your caregiver.  Do not take aspirin, ibuprofen, or other nonsteroidal anti-inflammatory drugs (NSAIDs). SEEK IMMEDIATE MEDICAL CARE IF:   You have pain in your arms, neck, jaw, teeth, or back.  Your pain increases or changes in intensity or duration.  You develop nausea, vomiting, or sweating (diaphoresis).  You develop shortness of breath, or you faint.  Your vomit is green, yellow, black, or looks like coffee grounds or blood.  Your stool is red, bloody, or black. These symptoms could be signs of other problems, such as heart disease, gastric bleeding, or esophageal bleeding. MAKE SURE YOU:   Understand these instructions.  Will watch your condition.  Will get help right away if you are not doing well or get worse. Document Released: 09/10/2005 Document Revised: 02/23/2012  Document Reviewed: 06/20/2011 [facility] Patient Information 2014 [place], [place].    Emergency Department Resource Guide 1) Find a Doctor and Pay Out of Pocket Although you won't have to find out who is covered by your  insurance plan, it is a good idea to ask around and get recommendations. You will then need to call the office and see if the doctor you have chosen will accept you as a new patient and what types of options they offer for patients who are self-pay. Some doctors offer discounts or will set up payment plans for their patients who do not have insurance, but you will need to ask so you aren't surprised when you get to your appointment.  2) Contact Your Local Health Department Not all health departments have doctors that can see patients for sick visits, but many do, so it is worth a call to see if yours does. If you don't know where your local health department is, you can check in your phone book. The CDC also has a tool to help you locate your state's health department, and many state websites also have listings of all of their local health departments.  3) Find a Walk-in Clinic If your illness is not likely to be very severe or complicated, you may want to try a walk in clinic. These are popping up all over the country in pharmacies, drugstores, and shopping centers. They're usually staffed by nurse practitioners or physician assistants that have been trained to treat common illnesses and complaints. They're usually fairly quick and inexpensive. However, if you have serious medical issues or chronic medical problems, these are probably not your best option.  No Primary Care Doctor: - Call Health Connect at  [phone] - they can help you locate a primary care doctor that  accepts your insurance, provides certain services, etc. - Physician Referral Service- [phone]  Chronic Pain Problems: Organization         Address  Phone   Notes  [facility]  [phone]) [phone] Patients need to be referred by their primary care doctor.   Medication Assistance: Organization         Address  Phone   Notes  [facility] Medication [facility] [address] [place], [place] [address] [phone]) [phone] --Must be a resident of [facility] -- Must have NO insurance coverage whatsoever (no Medicaid/ Medicare, etc.) -- The pt. MUST have a primary care doctor that directs their care regularly and follows them in the community   MedAssist  [phone]   [employer]  [phone]    Agencies that provide inexpensive medical care: Organization         Address  Phone   Notes  [facility]  [phone]   [facility]    [phone]   [facility] [address] [place], [place] [address] [phone]) [phone]   [facility] [address], [place] [phone]) [phone]   [facility]anned Parenthood    [phone](336) [phone]   [facility]    [phone]   Community Health and [facility]  [address], [place] Phone:  [phone](336) [phone], Fax:  [phone] Hours of Operation:  9 am - 6 pm, M-F.  Also accepts Medicaid/Medicare and self-pay.  [facility]  [address],  Phone: [phone]) [phone], Fax: [phone]. Hours of Operation:  8:30 am - 5:30  pm, M-F.  Also accepts Medicaid and self-pay.  [facility] [place] [address], [place] Phone: [phone](336) [phone]   [facility] [address], [place], [place] [phone], Ext. [phone] Mondays & Thursdays: 7-9 AM.  First 15 patients are seen on a first come, first serve basis.    Medicaid-accepting [facility] Providers:  Organization         Address  Phone   Notes  [facility] [address], [place] [phone]) [phone] Also accepts self-pay patients.  [facility] [address], [place]  [phone]) [phone]   [facility] [address], [place] [phone]) [phone]   [facility] [address] [place] [address], [place] [phone]) [phone]   Renaye RakersVeita Bland [address]  [address], [place]   [phone]) [phone] Only accepts [place] Access [place] patients after they have their name applied to their card.   Self-Pay (no insurance) in [facility]:  Organization         Address  Phone   Notes  Sickle Cell Patients, [facility] [address], [place] [phone]   [facility] [address], [place] [phone]) [phone]   [facility] [place]  [address], [place] [phone]) [phone]   [facility]/Dr. Osei-Bonsu  [address] [place] [address], [place] or [phone], [place] [phone]) [phone] Phone number for both [place] and [place] locations is the same.  [facility]gent [facility] and [facility] [address], [place] [phone]) [phone]   [facility] [place] [address] [place] [address], [place] or [address] [phone]) [phone](336) [phone]   [facility] [address], [place] [phone]) [phone], phone; [phone](336) [phone], fax Sees patients 1st and 3rd Saturday of every month.  Must not qualify for public or private insurance (i.e. Medicaid, Medicare, [place] Health Choice, Veterans' Benefits)  Household income should be no more than 200% of the poverty level The clinic cannot treat you if you are pregnant or think you are pregnant  Sexually transmitted diseases are not treated at the clinic.    Dental Care: Organization         Address  Phone  Notes  [facility] Department of [facility] [address], [place] [phone] Accepts children up to age 46 who are enrolled in [place] or [place] Health Choice; pregnant women with a Medicaid card; and children who have applied for Medicaid or [place] Health Choice, but were declined, whose parents can pay a reduced fee at time of service.  [facility] of [facility] [place]  [address], [place] [phone]) [phone] Accepts children up to age 46 who are enrolled  in [place] or [place] Health Choice; pregnant women with a Medicaid card; and children who have applied for Medicaid or [place] Health Choice, but were declined, whose parents can pay a reduced fee at time of service.  [facility]  [address], [place] [phone]) [phone] Patients are seen by appointment only. Walk-ins are not accepted. [facility] will see patients 46 years of age and older. Monday - Tuesday (8am-5pm) Most Wednesdays (8:30-5pm) $30 per visit, cash only  [facility]  [address], [facility] [phone]) [phone] Patients are seen by appointment only. Walk-ins are not  accepted. [facility] will see patients 5 years of age and older. One Wednesday Evening (Monthly: Volunteer Based).  $30 per visit, cash only  [employer]  [phone] for adults; Children under age 23, call Graduate Pediatric Dentistry at [phone]. Children aged 17-14, please call [phone] to request a pediatric application.  Dental services are provided in all areas of dental care including fillings, crowns and bridges, complete and partial dentures, implants, gum treatment, root canals, and extractions. Preventive care is also provided. Treatment is provided to both adults and children. Patients are selected via a lottery and there is often a waiting list.   [facility] [address] [place]  [phone] [email or web address]   [facility] [address] [place], [place] [phone], Ext. [phone] Second and Fourth Thursday of each month, opens at 6:30 AM; Clinic ends at 9 AM.  Patients are seen on a first-come first-served basis, and a limited number are seen during each clinic.   [facility]  [address] [place] [place], [place] [phone]   Eligibility Requirements You must have lived in [place], [place], or [place] counties for at least the last three months.   You cannot be  eligible for state or federal sponsored [place], including [employer], [place], or [employer].   You generally cannot be eligible for healthcare insurance through your employer.    How to apply: Eligibility screenings are held every Tuesday and Wednesday afternoon from 1:00 pm until 4:00 pm. You do not need an appointment for the interview!  [facility] [address], [place], [place] [phone]   [facility] Health Department  [phone]   [facility] Health Department  [phone]   [facility] Health Department  [phone]    Behavioral Health Resources in the Community: Intensive Outpatient Programs Organization         Address  Phone  Notes  [facility] [address] [place], [place] [phone]   [facility] Outpatient [address] [place], [place] [phone]   [facility] [address] [place], [place]  [phone]   [facility] Mental Health [address]  [place], [place] [phone] or [phone]   Substance Abuse Resources Organization         Address  Phone  Notes  Alcohol and Drug Services  [phone]   [facility]  [phone]   [address] [place]  5410456187   Floydene Flock  [phone]   Residential & Outpatient Substance Abuse Program  [phone]   Psychological Services Organization         Address  Phone  Notes  [facility] Health  [phone]   [facility] Services  [phone]   [facility] Mental Health [address], [place] [phone] or [phone]    Mobile Crisis Teams Organization         Address  Phone  Notes  [facility], Mobile Crisis Care Unit  [phone]   [facility]  [address] [place], [place] [phone]   Doristine Locks [address] [place] [phone]    Self-Help/Support Groups Organization          Address  Phone             Notes  [facility] [place] - variety of support groups  336- I7437963 Call for more information  Narcotics Anonymous (NA), Caring  Services [address], [employer] [place]  2 meetings at this location   Residential Sports administrator         Address  Phone  Notes  [facility] [address],    [place]  [phone]   [facility]  [address] [place] [address], [place], [place] [phone]   [facility] [address] [place], [place] [phone] Admissions: 8am-3pm M-F  [facility] [address],    [place], [place] [phone]   [facility] [address] [place], [place], [place] [phone]   [facility] [address],  [place], [place] [phone]   [facility] - Intensive Outpatient [address], [place], [place] [phone]   [facility] ([facility] [address] [place]  [place], [place] [phone] or [phone]   [facility] ([facility]) [address] [place], [place] [phone] Accepts Medicaid  [facility] [address]  [place] [phone] Substance Abuse/Addiction Treatment   [facility] Organization         Address  Phone  Notes  [facility]  [phone]   Angie Fava, PhD [address]vin Knack [place], [place]   [phone]) [phone] or [phone]   [facility]al   [address] [place], [place] [phone]   [facility] [address] [place], [place] [phone] Insurance/Medicaid/sponsorship through [facility] [address]                                    [place], [place] [phone] Therapy/tele-psych/case  [facility] [address], [place] [phone]    Dr. Lolly Mustache  [phone]   [facility]  [employer]  [facility] Dept. 1) [address] [place] 2) [address] [place] 3)  [address], [place] [phone]  [phone]   [facility] Child Abuse Hotline 763-699-7156 or 5132816428 (After Hours)

## 2014-04-26 NOTE — ED Notes (Signed)
Pt back from x-ray.

## 2014-04-26 NOTE — ED Notes (Signed)
Patient refusing nitro currently.

## 2014-06-28 ENCOUNTER — Other Ambulatory Visit: Payer: Self-pay

## 2014-06-28 ENCOUNTER — Encounter (HOSPITAL_COMMUNITY): Payer: Self-pay | Admitting: Emergency Medicine

## 2014-06-28 ENCOUNTER — Emergency Department (HOSPITAL_COMMUNITY)
Admission: EM | Admit: 2014-06-28 | Discharge: 2014-06-29 | Disposition: A | Discharge status: Home / Self Care | Payer: Self-pay | Attending: Emergency Medicine | Admitting: Emergency Medicine

## 2014-06-28 DIAGNOSIS — F17200 Nicotine dependence, unspecified, uncomplicated: Secondary | ICD-10-CM | POA: Insufficient documentation

## 2014-06-28 DIAGNOSIS — N509 Disorder of male genital organs, unspecified: Secondary | ICD-10-CM | POA: Insufficient documentation

## 2014-06-28 DIAGNOSIS — R61 Generalized hyperhidrosis: Secondary | ICD-10-CM | POA: Insufficient documentation

## 2014-06-28 DIAGNOSIS — N50819 Testicular pain, unspecified: Secondary | ICD-10-CM

## 2014-06-28 DIAGNOSIS — R079 Chest pain, unspecified: Secondary | ICD-10-CM | POA: Insufficient documentation

## 2014-06-28 DIAGNOSIS — R112 Nausea with vomiting, unspecified: Secondary | ICD-10-CM | POA: Insufficient documentation

## 2014-06-28 LAB — CBC
HEMATOCRIT: 44.8 % (ref 39.0–52.0)
HEMOGLOBIN: 16 g/dL (ref 13.0–17.0)
MCH: 33 pg (ref 26.0–34.0)
MCHC: 35.7 g/dL (ref 30.0–36.0)
MCV: 92.4 fL (ref 78.0–100.0)
Platelets: 180 10*3/uL (ref 150–400)
RBC: 4.85 MIL/uL (ref 4.22–5.81)
RDW: 13.8 % (ref 11.5–15.5)
WBC: 13.6 10*3/uL — ABNORMAL HIGH (ref 4.0–10.5)

## 2014-06-28 LAB — I-STAT TROPONIN, ED: Troponin i, poc: 0 ng/mL (ref 0.00–0.08)

## 2014-06-28 NOTE — ED Notes (Signed)
Per EMS pt from home c/o substernal left sided chest pressure 8/10 has had 324 ASA and 1 nitro with no relief. Pt endoreses SOB, nausea- multiple episode of vomiting and diarrhea. Pt received 4mg  IV zofran with no relief.

## 2014-06-29 ENCOUNTER — Emergency Department (HOSPITAL_COMMUNITY): Payer: Self-pay

## 2014-06-29 ENCOUNTER — Other Ambulatory Visit: Payer: Self-pay

## 2014-06-29 LAB — I-STAT CHEM 8, ED
BUN: 10 mg/dL (ref 6–23)
CHLORIDE: 101 meq/L (ref 96–112)
CREATININE: 1 mg/dL (ref 0.50–1.35)
Calcium, Ion: 1.18 mmol/L (ref 1.12–1.23)
Glucose, Bld: 129 mg/dL — ABNORMAL HIGH (ref 70–99)
HCT: 50 % (ref 39.0–52.0)
Hemoglobin: 17 g/dL (ref 13.0–17.0)
Potassium: 3.8 mEq/L (ref 3.7–5.3)
SODIUM: 136 meq/L — AB (ref 137–147)
TCO2: 22 mmol/L (ref 0–100)

## 2014-06-29 LAB — URINALYSIS, ROUTINE W REFLEX MICROSCOPIC
Glucose, UA: NEGATIVE mg/dL
Hgb urine dipstick: NEGATIVE
Ketones, ur: 40 mg/dL — AB
LEUKOCYTES UA: NEGATIVE
Nitrite: NEGATIVE
Protein, ur: NEGATIVE mg/dL
Specific Gravity, Urine: 1.023 (ref 1.005–1.030)
UROBILINOGEN UA: 1 mg/dL (ref 0.0–1.0)
pH: 5.5 (ref 5.0–8.0)

## 2014-06-29 LAB — I-STAT TROPONIN, ED: TROPONIN I, POC: 0 ng/mL (ref 0.00–0.08)

## 2014-06-29 MED ORDER — GI COCKTAIL ~~LOC~~
30.0000 mL | Freq: Once | ORAL | Status: AC
  Administered 2014-06-29: 30 mL via ORAL
  Filled 2014-06-29: qty 30

## 2014-06-29 MED ORDER — MORPHINE SULFATE 4 MG/ML IJ SOLN
8.0000 mg | Freq: Once | INTRAMUSCULAR | Status: AC
  Administered 2014-06-29: 8 mg via INTRAVENOUS
  Filled 2014-06-29: qty 2

## 2014-06-29 MED ORDER — ONDANSETRON HCL 4 MG/2ML IJ SOLN
4.0000 mg | Freq: Once | INTRAMUSCULAR | Status: AC
  Administered 2014-06-29: 4 mg via INTRAVENOUS
  Filled 2014-06-29: qty 2

## 2014-06-29 MED ORDER — TRAMADOL HCL 50 MG PO TABS: 50.0000 mg | ORAL_TABLET | Freq: Four times a day (QID) | ORAL | Status: DC | PRN

## 2014-06-29 NOTE — ED Notes (Signed)
Patient transported to Ultrasound 

## 2014-06-29 NOTE — ED Notes (Signed)
Pt discharged home with all belongings, pt alert, oriented and ambulatory upon discharge. Pt refused a wheel chair upon discharge. 1 new RX prescribed, pt verbalizes understanding of discharge instructions, pt drove self home.

## 2014-06-29 NOTE — ED Notes (Signed)
Pt returned from XR,. Placed back on monitor

## 2014-06-29 NOTE — ED Notes (Signed)
MD Belfie at bedside.

## 2014-06-29 NOTE — Discharge Instructions (Signed)

## 2014-06-29 NOTE — ED Notes (Signed)
Patient transported to X-ray 

## 2014-06-29 NOTE — ED Notes (Signed)
Pt also reports testicle pain that started after removing a tire off of his car yesterday. Pt states the pain is worse with movement, stretching his legs out or coughing. Pt denies penile discharge, blood in his urine, or burning with urination. Pt states he is passing urine with out any complications

## 2014-06-29 NOTE — ED Provider Notes (Signed)
CSN: [id number]     Arrival date & time 06/28/14  2253 History   First MD Initiated Contact with Patient 06/29/14 [phone]     Chief Complaint  Patient presents with  . Chest Pain     (Consider location/radiation/quality/duration/timing/severity/associated sxs/prior Treatment) HPI Comments: Patient presents with chest pain and groin pain. He states she's had chest pain since yesterday. It's been intermittent. It's in the Center of his chest and nonradiating. He took a nitroglycerin and aspirin per a mass and had no relief in symptoms. He denies any shortness of breath. He's had some nausea but no vomiting. He states that he's had a history of reflux in the past and that the pain today feels similar. He does smoke. He has no known history of hypertension or diabetes or hyperlipidemia. There is no family history of early heart disease. He also endorses right groin pain. He states he was trying to change a tire today and felt a pull in his right groin. He's having an intense pain to his right testicle and right groin. He denies any bulges or masses in the area. He denies any urinary symptoms.  Patient is a 48 y.o. male presenting with chest pain.  Chest Pain Associated symptoms: nausea and vomiting   Associated symptoms: no abdominal pain, no back pain, no cough, no diaphoresis, no dizziness, no fatigue, no fever, no headache, no numbness, no shortness of breath and no weakness     Past Medical History  Diagnosis Date  . GERD (gastroesophageal reflux disease)    History reviewed. No pertinent past surgical history. No family history on file. History  Substance Use Topics  . Smoking status: Current Every Day Smoker -- 0.50 packs/day for 30 years    Types: Cigarettes  . Smokeless tobacco: Not on file  . Alcohol Use: 0.6 oz/week    1 Cans of beer per week    Review of Systems  Constitutional: Negative for fever, chills, diaphoresis and fatigue.  HENT: Negative for congestion, rhinorrhea and  sneezing.   Eyes: Negative.   Respiratory: Negative for cough, chest tightness and shortness of breath.   Cardiovascular: Positive for chest pain. Negative for leg swelling.  Gastrointestinal: Positive for nausea and vomiting. Negative for abdominal pain, diarrhea and blood in stool.  Genitourinary: Positive for testicular pain. Negative for frequency, hematuria, flank pain and difficulty urinating.  Musculoskeletal: Negative for arthralgias and back pain.  Skin: Negative for rash.  Neurological: Negative for dizziness, speech difficulty, weakness, numbness and headaches.      Allergies  Review of patient's allergies indicates no known allergies.  Home Medications   Prior to Admission medications   Medication Sig Start Date End Date Taking? Authorizing Provider  omeprazole (PRILOSEC) 20 MG capsule Take 1 capsule (20 mg total) by mouth daily. 04/26/14  Yes Hannah Muthersbaugh, PA-C  PRESCRIPTION MEDICATION Take 1 tablet by mouth once. Muscle relaxer   Yes Historical Provider, MD  traMADol (ULTRAM) 50 MG tablet Take 1 tablet (50 mg total) by mouth every 6 (six) hours as needed. 06/29/14   Rolan Bucco, MD   BP 109/65  Pulse 68  Temp(Src) 98.8 F (37.1 C) (Oral)  Resp 18  Ht 5\' 9"  (1.753 m)  Wt 195 lb (88.451 kg)  BMI 28.78 kg/m2  SpO2 95% Physical Exam  Constitutional: He is oriented to person, place, and time. He appears well-developed and well-nourished. He appears distressed.  Patient appears uncomfortable and endorses severe groin pain  HENT:  Head: Normocephalic and atraumatic.  Eyes: Pupils are equal, round, and reactive to light.  Neck: Normal range of motion. Neck supple.  Cardiovascular: Normal rate, regular rhythm and normal heart sounds.   Pulmonary/Chest: Effort normal and breath sounds normal. No respiratory distress. He has no wheezes. He has no rales. He exhibits no tenderness.  Abdominal: Soft. Bowel sounds are normal. There is no tenderness. There is no rebound  and no guarding.  Genitourinary:  Positive right testicular tenderness. There's no hernias palpated.  Musculoskeletal: Normal range of motion. He exhibits no edema.  Lymphadenopathy:    He has no cervical adenopathy.  Neurological: He is alert and oriented to person, place, and time.  Skin: Skin is warm. No rash noted. He is diaphoretic.  Psychiatric: He has a normal mood and affect.    ED Course  Procedures (including critical care time) Labs Review Labs Reviewed  CBC - Abnormal; Notable for the following:    WBC 13.6 (*)    All other components within normal limits  URINALYSIS, ROUTINE W REFLEX MICROSCOPIC - Abnormal; Notable for the following:    Color, Urine AMBER (*)    Bilirubin Urine SMALL (*)    Ketones, ur 40 (*)    All other components within normal limits  I-STAT CHEM 8, ED - Abnormal; Notable for the following:    Sodium 136 (*)    Glucose, Bld 129 (*)    All other components within normal limits  I-STAT TROPOININ, ED  Rosezena SensorI-STAT TROPOININ, ED    Imaging Review Dg Chest 2 View  06/29/2014   CLINICAL DATA:  Chest pain and shortness of breath.  EXAM: CHEST  2 VIEW  COMPARISON:  Apr 26, 2014  FINDINGS: The heart size and mediastinal contours are within normal limits. Both lungs are clear. Similar increased lung volumes with flattened hemidiaphragms. The visualized skeletal structures are unremarkable.  IMPRESSION: No acute cardiopulmonary process ; stable appearance of chest from Apr 26, 2014.   Electronically Signed   By: Awilda Metroourtnay  Bloomer   On: 06/29/2014 01:34   [place] Scrotum  06/29/2014   CLINICAL DATA:  Right testicular pain.  EXAM: SCROTAL ULTRASOUND  DOPPLER ULTRASOUND OF THE TESTICLES  TECHNIQUE: Complete ultrasound examination of the testicles, epididymis, and other scrotal structures was performed. Color and spectral Doppler ultrasound were also utilized to evaluate blood flow to the testicles.  COMPARISON:  None.  FINDINGS: Right testicle  Measurements: 3.9 x 2.5 x  2.8 cm. Best seen on clips, there is a roughly wedge-shaped area of hypoechogenicity involving the lower aspect of the testicle, approximately 17 mm in maximal diameter. This area has decreased color Doppler flow. Reportedly there was no direct trauma to the testicle to suggest contusion. There is no capsular disruption for rupture/fracture. The remainder of the testicle and the epididymis is not hypervascular to suggest infection. Additionally there is no dysuria per report. Noted history of pain after lifting, trauma can be a cause of segmental infarction.  Left testicle  Measurements: 4.1 x 2.6 x 2.6 cm. No mass or microlithiasis visualized.  Right epididymis:  Normal in size and appearance.  Left epididymis:  Normal in size and appearance.  Hydrocele:  Small right hydrocele.  Varicocele:  None visualized.  Pulsed Doppler interrogation of both testes demonstrates low resistance arterial and venous waveforms bilaterally.  IMPRESSION: 1. Focal abnormality in the lower right testicle which favors segmental infarction, see discussion above. Mandatory ultrasound followup (6-8 weeks) to ensure resolution and exclude testicular neoplasm. 2. Aside from the area of abnormality,  there is normal blood flow to the bilateral testicles.   Electronically Signed   By: Tiburcio Pea M.D.   On: 06/29/2014 04:03   [place] Art/ven Flow Abd Pelv Doppler  06/29/2014   CLINICAL DATA:  Right testicular pain.  EXAM: SCROTAL ULTRASOUND  DOPPLER ULTRASOUND OF THE TESTICLES  TECHNIQUE: Complete ultrasound examination of the testicles, epididymis, and other scrotal structures was performed. Color and spectral Doppler ultrasound were also utilized to evaluate blood flow to the testicles.  COMPARISON:  None.  FINDINGS: Right testicle  Measurements: 3.9 x 2.5 x 2.8 cm. Best seen on clips, there is a roughly wedge-shaped area of hypoechogenicity involving the lower aspect of the testicle, approximately 17 mm in maximal diameter. This area has  decreased color Doppler flow. Reportedly there was no direct trauma to the testicle to suggest contusion. There is no capsular disruption for rupture/fracture. The remainder of the testicle and the epididymis is not hypervascular to suggest infection. Additionally there is no dysuria per report. Noted history of pain after lifting, trauma can be a cause of segmental infarction.  Left testicle  Measurements: 4.1 x 2.6 x 2.6 cm. No mass or microlithiasis visualized.  Right epididymis:  Normal in size and appearance.  Left epididymis:  Normal in size and appearance.  Hydrocele:  Small right hydrocele.  Varicocele:  None visualized.  Pulsed Doppler interrogation of both testes demonstrates low resistance arterial and venous waveforms bilaterally.  IMPRESSION: 1. Focal abnormality in the lower right testicle which favors segmental infarction, see discussion above. Mandatory ultrasound followup (6-8 weeks) to ensure resolution and exclude testicular neoplasm. 2. Aside from the area of abnormality, there is normal blood flow to the bilateral testicles.   Electronically Signed   By: Tiburcio Pea M.D.   On: 06/29/2014 04:03     EKG Interpretation None      Date: 06/29/2014  Rate: 63  Rhythm: normal sinus rhythm  QRS Axis: normal  Intervals: normal  ST/T Wave abnormalities: nonspecific ST/T changes  Conduction Disutrbances:none  Narrative Interpretation:   Old EKG Reviewed: unchanged   MDM   Final diagnoses:  Chest pain, unspecified chest pain type  Testicular pain    Pt presents with 2 problems: PT has chest pain.  Has had 2 normal EKGs and negative delta troponin. Has a Heart score of 2-3, so I feel that he can be discharged and can f/u with cardiology.  Patient also has right testicular pain after lifting a heavy tire. His ultrasound showed a concerning area of possible infarction. I reviewed this with the radiologist and he feels that this can be seen with trauma. Otherwise the testicles had  good flow with no evidence of torsion per the radiologist. The radiologist felt that he should have a repeat imaging study to ensure resolution and to ensure that this is not a neoplasm. I discussed this at length with the patient and I did emphasize the importance of followup with urology. I referred him to [facility]. Advised to return here if his symptoms worsen. He was given a prescription for Ultram for pain.    Rolan Bucco, MD 06/29/14 [phone]

## 2015-02-13 DIAGNOSIS — R059 Cough, unspecified: Secondary | ICD-10-CM

## 2015-02-13 HISTORY — DX: Cough, unspecified: R05.9

## 2015-03-14 ENCOUNTER — Encounter (HOSPITAL_COMMUNITY): Payer: Self-pay | Admitting: Emergency Medicine

## 2015-03-14 ENCOUNTER — Inpatient Hospital Stay (HOSPITAL_COMMUNITY)
Admission: EM | Admit: 2015-03-14 | Discharge: 2015-03-16 | DRG: 684 | Disposition: A | Discharge status: Home / Self Care | Payer: Self-pay | Attending: Internal Medicine | Admitting: Internal Medicine

## 2015-03-14 ENCOUNTER — Emergency Department (HOSPITAL_COMMUNITY): Payer: Self-pay

## 2015-03-14 DIAGNOSIS — R7401 Elevation of levels of liver transaminase levels: Secondary | ICD-10-CM | POA: Diagnosis present

## 2015-03-14 DIAGNOSIS — N179 Acute kidney failure, unspecified: Principal | ICD-10-CM | POA: Diagnosis present

## 2015-03-14 DIAGNOSIS — J111 Influenza due to unidentified influenza virus with other respiratory manifestations: Secondary | ICD-10-CM

## 2015-03-14 DIAGNOSIS — A084 Viral intestinal infection, unspecified: Secondary | ICD-10-CM

## 2015-03-14 DIAGNOSIS — R740 Nonspecific elevation of levels of transaminase and lactic acid dehydrogenase [LDH]: Secondary | ICD-10-CM

## 2015-03-14 DIAGNOSIS — R7303 Prediabetes: Secondary | ICD-10-CM | POA: Diagnosis present

## 2015-03-14 DIAGNOSIS — K219 Gastro-esophageal reflux disease without esophagitis: Secondary | ICD-10-CM | POA: Diagnosis present

## 2015-03-14 DIAGNOSIS — Z09 Encounter for follow-up examination after completed treatment for conditions other than malignant neoplasm: Secondary | ICD-10-CM

## 2015-03-14 DIAGNOSIS — F17210 Nicotine dependence, cigarettes, uncomplicated: Secondary | ICD-10-CM | POA: Diagnosis present

## 2015-03-14 DIAGNOSIS — N501 Vascular disorders of male genital organs: Secondary | ICD-10-CM | POA: Diagnosis not present

## 2015-03-14 DIAGNOSIS — E872 Acidosis, unspecified: Secondary | ICD-10-CM | POA: Diagnosis present

## 2015-03-14 DIAGNOSIS — R748 Abnormal levels of other serum enzymes: Secondary | ICD-10-CM | POA: Diagnosis present

## 2015-03-14 DIAGNOSIS — R634 Abnormal weight loss: Secondary | ICD-10-CM | POA: Diagnosis present

## 2015-03-14 DIAGNOSIS — R7301 Impaired fasting glucose: Secondary | ICD-10-CM | POA: Diagnosis present

## 2015-03-14 DIAGNOSIS — J101 Influenza due to other identified influenza virus with other respiratory manifestations: Secondary | ICD-10-CM | POA: Diagnosis present

## 2015-03-14 DIAGNOSIS — R112 Nausea with vomiting, unspecified: Secondary | ICD-10-CM

## 2015-03-14 HISTORY — DX: Cough: R05

## 2015-03-14 LAB — SALICYLATE LEVEL: Salicylate Lvl: <4 mg/dL (ref 2.8–20.0)

## 2015-03-14 LAB — COMPREHENSIVE METABOLIC PANEL
ALT: 41 U/L (ref 0–53)
AST: 73 U/L — AB (ref 0–37)
Albumin: 4 g/dL (ref 3.5–5.2)
Alkaline Phosphatase: 68 U/L (ref 39–117)
Anion gap: 13 (ref 5–15)
BILIRUBIN TOTAL: 2.2 mg/dL — AB (ref 0.3–1.2)
BUN: 34 mg/dL — AB (ref 6–23)
CALCIUM: 9.2 mg/dL (ref 8.4–10.5)
CHLORIDE: 105 mmol/L (ref 96–112)
CO2: 15 mmol/L — ABNORMAL LOW (ref 19–32)
Creatinine, Ser: 2.2 mg/dL — ABNORMAL HIGH (ref 0.50–1.35)
GFR calc Af Amer: 40 mL/min — ABNORMAL LOW (ref >90)
GFR calc non Af Amer: 34 mL/min — ABNORMAL LOW (ref >90)
Glucose, Bld: 100 mg/dL — ABNORMAL HIGH (ref 70–99)
Potassium: 5.1 mmol/L (ref 3.5–5.1)
SODIUM: 133 mmol/L — AB (ref 135–145)
Total Protein: 7.9 g/dL (ref 6.0–8.3)

## 2015-03-14 LAB — INFLUENZA PANEL BY PCR (TYPE A & B)
H1N1 flu by pcr: DETECTED — AB
INFLAPCR: POSITIVE — AB
INFLBPCR: NEGATIVE

## 2015-03-14 LAB — URINALYSIS, ROUTINE W REFLEX MICROSCOPIC
Glucose, UA: NEGATIVE mg/dL
KETONES UR: 15 mg/dL — AB
LEUKOCYTES UA: NEGATIVE
Nitrite: NEGATIVE
Protein, ur: 100 mg/dL — AB
Specific Gravity, Urine: 1.022 (ref 1.005–1.030)
Urobilinogen, UA: 0.2 mg/dL (ref 0.0–1.0)
pH: 5 (ref 5.0–8.0)

## 2015-03-14 LAB — I-STAT CG4 LACTIC ACID, ED
LACTIC ACID, VENOUS: 0.99 mmol/L (ref 0.5–2.0)
Lactic Acid, Venous: 2.08 mmol/L (ref 0.5–2.0)

## 2015-03-14 LAB — CREATININE, SERUM
Creatinine, Ser: 1.42 mg/dL — ABNORMAL HIGH (ref 0.50–1.35)
GFR calc non Af Amer: 58 mL/min — ABNORMAL LOW (ref >90)
GFR, EST AFRICAN AMERICAN: 67 mL/min — AB (ref >90)

## 2015-03-14 LAB — CBC WITH DIFFERENTIAL/PLATELET
Basophils Absolute: 0 10*3/uL (ref 0.0–0.1)
Basophils Relative: 0 % (ref 0–1)
Eosinophils Absolute: 0 10*3/uL (ref 0.0–0.7)
Eosinophils Relative: 0 % (ref 0–5)
HEMATOCRIT: 51 % (ref 39.0–52.0)
Hemoglobin: 18.2 g/dL — ABNORMAL HIGH (ref 13.0–17.0)
Lymphocytes Relative: 27 % (ref 12–46)
Lymphs Abs: 1.7 10*3/uL (ref 0.7–4.0)
MCH: 33.6 pg (ref 26.0–34.0)
MCHC: 35.7 g/dL (ref 30.0–36.0)
MCV: 94.3 fL (ref 78.0–100.0)
MONOS PCT: 15 % — AB (ref 3–12)
Monocytes Absolute: 0.9 10*3/uL (ref 0.1–1.0)
Neutro Abs: 3.6 10*3/uL (ref 1.7–7.7)
Neutrophils Relative %: 58 % (ref 43–77)
Platelets: 183 10*3/uL (ref 150–400)
RBC: 5.41 MIL/uL (ref 4.22–5.81)
RDW: 14.1 % (ref 11.5–15.5)
WBC: 6.2 10*3/uL (ref 4.0–10.5)

## 2015-03-14 LAB — URINE MICROSCOPIC-ADD ON

## 2015-03-14 LAB — I-STAT VENOUS BLOOD GAS, ED
ACID-BASE DEFICIT: 10 mmol/L — AB (ref 0.0–2.0)
BICARBONATE: 13.8 meq/L — AB (ref 20.0–24.0)
O2 Saturation: 93 %
PCO2 VEN: 26.6 mmHg — AB (ref 45.0–50.0)
TCO2: 15 mmol/L (ref 0–100)
pH, Ven: 7.323 — ABNORMAL HIGH (ref 7.250–7.300)
pO2, Ven: 70 mmHg — ABNORMAL HIGH (ref 30.0–45.0)

## 2015-03-14 LAB — CBC
HCT: 46.2 % (ref 39.0–52.0)
Hemoglobin: 15.1 g/dL (ref 13.0–17.0)
MCH: 31.9 pg (ref 26.0–34.0)
MCHC: 32.7 g/dL (ref 30.0–36.0)
MCV: 97.7 fL (ref 78.0–100.0)
Platelets: 129 10*3/uL — ABNORMAL LOW (ref 150–400)
RBC: 4.73 MIL/uL (ref 4.22–5.81)
RDW: 14.1 % (ref 11.5–15.5)
WBC: 4.8 10*3/uL (ref 4.0–10.5)

## 2015-03-14 LAB — CK: Total CK: 625 U/L — ABNORMAL HIGH (ref 7–232)

## 2015-03-14 LAB — ACETAMINOPHEN LEVEL: Acetaminophen (Tylenol), Serum: <10 ug/mL — ABNORMAL LOW (ref 10–30)

## 2015-03-14 LAB — ETHANOL

## 2015-03-14 LAB — LIPASE, BLOOD: Lipase: 34 U/L (ref 11–59)

## 2015-03-14 LAB — TROPONIN I

## 2015-03-14 MED ORDER — ENSURE ENLIVE PO LIQD
237.0000 mL | Freq: Two times a day (BID) | ORAL | Status: DC
  Administered 2015-03-14 – 2015-03-16 (×2): 237 mL via ORAL

## 2015-03-14 MED ORDER — GI COCKTAIL ~~LOC~~
30.0000 mL | Freq: Once | ORAL | Status: AC
  Administered 2015-03-14: 30 mL via ORAL
  Filled 2015-03-14: qty 30

## 2015-03-14 MED ORDER — OSELTAMIVIR PHOSPHATE 75 MG PO CAPS
75.0000 mg | ORAL_CAPSULE | Freq: Two times a day (BID) | ORAL | Status: DC
  Administered 2015-03-14 – 2015-03-16 (×4): 75 mg via ORAL
  Filled 2015-03-14 (×5): qty 1

## 2015-03-14 MED ORDER — HEPARIN SODIUM (PORCINE) 5000 UNIT/ML IJ SOLN
5000.0000 [IU] | Freq: Three times a day (TID) | INTRAMUSCULAR | Status: DC
  Administered 2015-03-14 – 2015-03-16 (×5): 5000 [IU] via SUBCUTANEOUS
  Filled 2015-03-14 (×8): qty 1

## 2015-03-14 MED ORDER — FENTANYL CITRATE 0.05 MG/ML IJ SOLN
50.0000 ug | Freq: Once | INTRAMUSCULAR | Status: AC
  Administered 2015-03-14: 50 ug via INTRAVENOUS
  Filled 2015-03-14: qty 2

## 2015-03-14 MED ORDER — ONDANSETRON HCL 4 MG PO TABS: 4.0000 mg | ORAL_TABLET | Freq: Four times a day (QID) | ORAL | Status: DC | PRN

## 2015-03-14 MED ORDER — ONDANSETRON HCL 4 MG/2ML IJ SOLN
4.0000 mg | Freq: Once | INTRAMUSCULAR | Status: AC
  Administered 2015-03-14: 4 mg via INTRAVENOUS
  Filled 2015-03-14: qty 2

## 2015-03-14 MED ORDER — SODIUM CHLORIDE 0.9 % IV BOLUS (SEPSIS)
1000.0000 mL | Freq: Once | INTRAVENOUS | Status: AC
  Administered 2015-03-14: 1000 mL via INTRAVENOUS

## 2015-03-14 MED ORDER — ACETAMINOPHEN 650 MG RE SUPP: 650.0000 mg | Freq: Four times a day (QID) | RECTAL | Status: DC | PRN

## 2015-03-14 MED ORDER — SODIUM CHLORIDE 0.9 % IV SOLN
INTRAVENOUS | Status: DC
  Administered 2015-03-14: 1000 mL via INTRAVENOUS
  Administered 2015-03-14 – 2015-03-16 (×5): via INTRAVENOUS

## 2015-03-14 MED ORDER — ONDANSETRON HCL 4 MG/2ML IJ SOLN
4.0000 mg | Freq: Four times a day (QID) | INTRAMUSCULAR | Status: DC | PRN
  Administered 2015-03-15 (×2): 4 mg via INTRAVENOUS
  Filled 2015-03-14 (×2): qty 2

## 2015-03-14 MED ORDER — INFLUENZA VAC SPLIT QUAD 0.5 ML IM SUSY: 0.5000 mL | PREFILLED_SYRINGE | INTRAMUSCULAR | Status: DC

## 2015-03-14 MED ORDER — ACETAMINOPHEN 325 MG PO TABS
650.0000 mg | ORAL_TABLET | Freq: Four times a day (QID) | ORAL | Status: DC | PRN
  Administered 2015-03-14: 650 mg via ORAL
  Filled 2015-03-14: qty 2

## 2015-03-14 MED ORDER — PNEUMOCOCCAL VAC POLYVALENT 25 MCG/0.5ML IJ INJ: 0.5000 mL | INJECTION | INTRAMUSCULAR | Status: DC

## 2015-03-14 NOTE — ED Notes (Signed)
PT monitored by pulse ox, bp cuff, and 5-lead. 

## 2015-03-14 NOTE — ED Notes (Signed)
Patient given Ice chips,MD stated okay to give.

## 2015-03-14 NOTE — ED Notes (Signed)
Admitting at bedside 

## 2015-03-14 NOTE — ED Notes (Signed)
EMS states patient has cough is Saturday from walking in rain. Patient states sputum varies in color. Patient states every time he cough he has stools. Patient complains of  Rib pain. Vitals 118/68 Hr 100 RR18 96% . Patient states pain is 5/10.

## 2015-03-14 NOTE — Progress Notes (Signed)
NURSING PROGRESS NOTE  Gregory LipsJames Grant [id number] Admission Data: 03/14/2015 2:26 PM Attending Provider: Aletta EdouardShilpa Bhardwaj, MD PCP:No PCP Per Patient Code Status: full   Gregory LipsJames Grant is a 49 y.o. male patient admitted from ED:  -No acute distress noted.  -No complaints of shortness of breath.  -No complaints of chest pain.     Blood pressure 146/93, pulse 57, temperature 97.9 F (36.6 C), temperature source Oral, resp. rate 18, height 5\' 6"  (1.676 m), weight 75.252 kg (165 lb 14.4 oz), SpO2 100 %.   IV Fluids:  IV in place, occlusive dsg intact without redness, IV cath antecubital left, condition patent and no redness normal saline @150 .   Allergies:  Review of patient's allergies indicates no known allergies.  Past Medical History:   has a past medical history of GERD (gastroesophageal reflux disease).  Past Surgical History:   has no past surgical history on file.  Social History:   reports that he has been smoking Cigarettes.  He has a 15 pack-year smoking history. He does not have any smokeless tobacco history on file. He reports that he drinks about 0.6 oz of alcohol per week. He reports that he does not use illicit drugs.  Skin: warm, dry intact   Patient/Family orientated to room. Information packet given to patient/family. Admission inpatient armband information verified with patient/family to include name and date of birth and placed on patient arm. Side rails up x 2, fall assessment and education completed with patient/family. Patient/family able to verbalize understanding of risk associated with falls and verbalized understanding to call for assistance before getting out of bed. Call light within reach. Patient/family able to voice and demonstrate understanding of unit orientation instructions.    Will continue to evaluate and treat per MD orders.

## 2015-03-14 NOTE — Progress Notes (Signed)
RN called for report.  

## 2015-03-14 NOTE — H&P (Signed)
Date: 03/14/2015               Patient Name:  Gregory LipsJames Avetisyan MRN: [id number]  DOB: Apr 14, 1966 Age / Sex: 49 y.o., male   PCP: No Pcp Per Patient         Medical Service: Internal Medicine Teaching Service         Attending Physician: Dr. Aletta EdouardShilpa Bhardwaj, MD    First Contact: Dr. Gara Kroneriana Truong Pager: [phone]  Second Contact: Dr. Carlynn PurlErik Hoffman Pager: [phone]       After Hours (After 5p/  First Contact Pager: [phone]  weekends / holidays): Second Contact Pager: [phone]   Chief Complaint: n/v, malaise, and cough   History of Present Illness: Pt is a 49 y/o male w/ PMHx of GERD who presents with n/v, generalized malaise, and coughing that began 4 days ago. Pt states he developed a cold and felt weak Saturday after walking in the rain. He also started coughing with subsequent rib pain due to coughing. On Tuesday he started having n/v and diarrhea. Pt had 12 bowel movements yesterday and 2 BMs morning of admission. Denies recent abx use. Denies bloody stools or hemoptysis. He has tried dayquil, nyquil, and theraflu for his symptoms without any relief. He has decreased appetite but has been able to keep fluids down. Pt endorses night sweats, chills, fevers, abdominal pain, and unintentional weight loss. Pt weights 165lbs on presentation and 195lbs during ED visit on 06/2014. He states he has noticed his jeans getting looser on him, he used to be a size 36 waist and now is size 32. He smokes 1PPD since and has smoked since he was 49 years old. Since being in the ED pt thinks that he feels better as he has not been coughing and has not had any bowel movements. He did not get a flu shot this year and has not been around any sick contacts.    Meds: Current Facility-Administered Medications  Medication Dose Route Frequency Provider Last Rate Last Dose  . fentaNYL (SUBLIMAZE) injection 50 mcg  50 mcg Intravenous Once Glynn OctaveStephen Rancour, MD      . sodium chloride 0.9 % bolus 1,000 mL  1,000 mL Intravenous  Once Glynn OctaveStephen Rancour, MD 500 mL/hr at 03/14/15 1021 1,000 mL at 03/14/15 1021   Current Outpatient Prescriptions  Medication Sig Dispense Refill  . omeprazole (PRILOSEC) 20 MG capsule Take 1 capsule (20 mg total) by mouth daily. 30 capsule 0  . PRESCRIPTION MEDICATION Take 1 tablet by mouth once. Muscle relaxer    . traMADol (ULTRAM) 50 MG tablet Take 1 tablet (50 mg total) by mouth every 6 (six) hours as needed. 15 tablet 0    Allergies: Allergies as of 03/14/2015  . (No Known Allergies)   Past Medical History  Diagnosis Date  . GERD (gastroesophageal reflux disease)    History reviewed. No pertinent past surgical history. No family history on file. History   Social History  . Marital Status: Divorced    Spouse Name: N/A  . Number of Children: N/A  . Years of Education: N/A   Occupational History  . Not on file.   Social History Main Topics  . Smoking status: Current Every Day Smoker -- 0.50 packs/day for 30 years    Types: Cigarettes  . Smokeless tobacco: Not on file  . Alcohol Use: 0.6 oz/week    1 Cans of beer per week  . Drug Use: No  . Sexual Activity: No   Other Topics Concern  .  Not on file   Social History Narrative    Review of Systems: Neg for chest pain, chest palpitations, SOB, dysuria.  Pertinent positives noted in HPI.   Physical Exam: Blood pressure 149/101, pulse 79, temperature 97.9 F (36.6 C), temperature source Oral, resp. rate 15, height  (1.676 m), weight 165 lb 14.4 oz (75.252 kg), SpO2 97 %. General: NAD, laying in bed comfortably Lungs: CTAB, no wheezing Cardiac: RRR, no murmurs GI: soft, active bowel sounds, tender to palpation along rib costal margin Neuro: CN II-XII grossly intact, 5/5 hand grip strength b/l Ext: neg for pedal edema  Lab results: Basic Metabolic Panel:  Recent Labs  16/10/96 0841  NA 133*  K 5.1  CL 105  CO2 15*  GLUCOSE 100*  BUN 34*  CREATININE 2.20*  CALCIUM 9.2   Liver Function  Tests:  Recent Labs  03/14/15 0841  AST 73*  ALT 41  ALKPHOS 68  BILITOT 2.2*  PROT 7.9  ALBUMIN 4.0    Recent Labs  03/14/15 0841  LIPASE 34   CBC:  Recent Labs  03/14/15 0841  WBC 6.2  NEUTROABS 3.6  HGB 18.2*  HCT 51.0  MCV 94.3  PLT 183   Cardiac Enzymes:  Recent Labs  03/14/15 0841  TROPONINI <0.03   Urinalysis:  Recent Labs  03/14/15 0948  COLORURINE YELLOW  LABSPEC 1.022  PHURINE 5.0  GLUCOSEU NEGATIVE  HGBUR MODERATE*  BILIRUBINUR MODERATE*  KETONESUR 15*  PROTEINUR 100*  UROBILINOGEN 0.2  NITRITE NEGATIVE  LEUKOCYTESUR NEGATIVE    Imaging results:  Dg Chest 2 View  03/14/2015   CLINICAL DATA:  Right rib pain starting Saturday  EXAM: CHEST  2 VIEW  COMPARISON:  06/29/2014  FINDINGS: Cardiomediastinal silhouette is stable. No acute infiltrate or pleural effusion. No pulmonary edema. Mild hyperinflation. No gross fractures are noted. No pneumothorax.  IMPRESSION: No active cardiopulmonary disease.   Electronically Signed   By: Natasha Mead M.D.   On: 03/14/2015 09:02    Other results: EKG: NSR, b/l atrial enlargement, no significant changes from EKG on 06/29/14  Assessment & Plan by Problem: Active Problems:   Acute renal failure  Acute Renal Failure--2/2 poor po intake Cr 2.20, b/l around 0.9-1.00. CBC reveals hemoconcentration as hemoglobin was 18.2. Given 2L bolus in the ED. CK elevated at 625 and AST elevated at 73 likely in response to elevated CK. Lactic acid trended down from 2.08 to 0.99. Pt afebrile and not tachycardic. Lab wise pt has a non anion gap acidosis 2/2 n/v and diarrhea. Ethanol, salicylate, and acetaminophen levels WNL.  - admit to med surg - continue NS at 150cc/hr - morning CMET and CK.   Viral gastroenteritis-- Pt has had n/v, cough, and generalized malaise x 4 days. likley viral in etiology, neg for leukocytosis, CXR negative, UA negative.  Lactic acid trended down from 2.08 to 0.99. Pt afebrile and not tachycardic.   - IVFs as above - influenza panel pending - tylenol prn for fever  Unintentional weight loss-- Documented weight of 211lbs on 04/26/14, 195lbs on 06/28/14, and 165 lbs today. Denies B symptoms prior to viral illness 4 days ago. States his great grandmother died of skin cancer. CXR WNL. Due to long smoking history will recommend further workup as outpatient. He does not have a PCP currently. He is thinking of possibly moving back to [place] to be with his son. -will help set pt up with IMTS clinic  GERD-- on prilosec  at home which  he takes occasionally. Denies acid reflux symptoms. Can give protonix  for acid reflux if needed.   Tobacco abuse - social work consulted for smoking cessation  FEN - reg diet  DVT ppx- hep [place]  Dispo:  Anticipated discharge in approximately 1-2 day(s).   The patient does not have a current PCP (No Pcp Per Patient) and does not need an [facility] hospital follow-up appointment after discharge.  The patient does not have transportation limitations that hinder transportation to clinic appointments.  Signed: Denton Brick, MD 03/14/2015, 12:03 PM

## 2015-03-14 NOTE — ED Provider Notes (Signed)
CSN: [id number]     Arrival date & time 03/14/15  0801 History   First MD Initiated Contact with Patient 03/14/15 [phone]     No chief complaint on file.    (Consider location/radiation/quality/duration/timing/severity/associated sxs/prior Treatment) HPI Comments: Patient presents with three-day history of cough, rib pain, body aches onset after he was walking in the rain 3 nights ago. He's also had nausea, vomiting and diarrhea ongoing for the past several days. States every time he coughs he has to have diarrhea. Unable to keep anything down yesterday. Unable to count number of episodes of emesis or diarrhea. Denies fever. Has had sick contacts at home. She taking TheraFlu without relief. Denies any cardiac history or pulmonary history. He has epigastric pain that is worse with palpation and worse after vomiting. He also has low back pain. Bilateral rib pain with coughing. No testicular pain.  The history is provided by the patient and the EMS personnel.    Past Medical History  Diagnosis Date  . GERD (gastroesophageal reflux disease)   . Coughing 02/2015   History reviewed. No pertinent past surgical history. History reviewed. No pertinent family history. History  Substance Use Topics  . Smoking status: Current Every Day Smoker -- 0.50 packs/day for 30 years    Types: Cigarettes  . Smokeless tobacco: Never Used  . Alcohol Use: 0.6 oz/week    1 Cans of beer per week    Review of Systems  Constitutional: Positive for activity change, appetite change and fatigue. Negative for fever.  HENT: Negative for congestion and rhinorrhea.   Respiratory: Positive for cough and chest tightness.   Cardiovascular: Negative for chest pain and palpitations.  Gastrointestinal: Positive for nausea, vomiting, abdominal pain and diarrhea.  Genitourinary: Negative for dysuria, hematuria and testicular pain.  Musculoskeletal: Positive for myalgias and arthralgias. Negative for back pain.  Skin: Negative  for rash.  Neurological: Positive for weakness. Negative for dizziness, light-headedness and headaches.  A complete 10 system review of systems was obtained and all systems are negative except as noted in the HPI and PMH.      Allergies  Review of patient's allergies indicates no known allergies.  Home Medications   Prior to Admission medications   Medication Sig Start Date End Date Taking? Authorizing Provider  cyclobenzaprine (FLEXERIL) 10 MG tablet Take 10 mg by mouth 2 (two) times daily as needed for muscle spasms.   Yes Historical Provider, MD  omeprazole (PRILOSEC) 20 MG capsule Take 1 capsule (20 mg total) by mouth daily. Patient taking differently: Take 20 mg by mouth daily as needed.  04/26/14  Yes Hannah Muthersbaugh, PA-C  traMADol (ULTRAM) 50 MG tablet Take 1 tablet (50 mg total) by mouth every 6 (six) hours as needed. 06/29/14   Rolan BuccoMelanie Belfi, MD   BP 146/93 mmHg  Pulse 57  Temp(Src) 97.9 F (36.6 C) (Oral)  Resp 18  Ht 5\' 6"  (1.676 m)  Wt 165 lb 14.4 oz (75.252 kg)  BMI 26.79 kg/m2  SpO2 100% Physical Exam  Constitutional: He is oriented to person, place, and time. He appears well-developed and well-nourished. No distress.  HENT:  Head: Normocephalic and atraumatic.  Mouth/Throat: Oropharynx is clear and moist. No oropharyngeal exudate.  Eyes: Conjunctivae and EOM are normal. Pupils are equal, round, and reactive to light.  Neck: Normal range of motion. Neck supple.  No meningismus.  Cardiovascular: Normal rate, regular rhythm, normal heart sounds and intact distal pulses.   No murmur heard. Pulmonary/Chest: Effort normal and breath  sounds normal. No respiratory distress. He exhibits tenderness.  Bilateral rib tenderness  Abdominal: Soft. There is no tenderness. There is no rebound and no guarding.  Epigastric tenderness  Musculoskeletal: Normal range of motion. He exhibits tenderness. He exhibits no edema.  Paraspinal lumbar tenderness 5/5 strength in  bilateral lower extremities. Ankle plantar and dorsiflexion intact. Great toe extension intact bilaterally. +2 DP and PT pulses. +2 patellar reflexes bilaterally. Normal gait.   Neurological: He is alert and oriented to person, place, and time. No cranial nerve deficit. He exhibits normal muscle tone. Coordination normal.  No ataxia on finger to nose bilaterally. No pronator drift. 5/5 strength throughout. CN 2-12 intact. Negative Romberg. Equal grip strength. Sensation intact. Gait is normal.   Skin: Skin is warm.  Psychiatric: He has a normal mood and affect. His behavior is normal.  Nursing note and vitals reviewed.   ED Course  Procedures (including critical care time) Labs Review Labs Reviewed  CBC WITH DIFFERENTIAL/PLATELET - Abnormal; Notable for the following:    Hemoglobin 18.2 (*)    Monocytes Relative 15 (*)    All other components within normal limits  COMPREHENSIVE METABOLIC PANEL - Abnormal; Notable for the following:    Sodium 133 (*)    CO2 15 (*)    Glucose, Bld 100 (*)    BUN 34 (*)    Creatinine, Ser 2.20 (*)    AST 73 (*)    Total Bilirubin 2.2 (*)    GFR calc non Af Amer 34 (*)    GFR calc Af Amer 40 (*)    All other components within normal limits  URINALYSIS, ROUTINE W REFLEX MICROSCOPIC - Abnormal; Notable for the following:    APPearance CLOUDY (*)    Hgb urine dipstick MODERATE (*)    Bilirubin Urine MODERATE (*)    Ketones, ur 15 (*)    Protein, ur 100 (*)    All other components within normal limits  ACETAMINOPHEN LEVEL - Abnormal; Notable for the following:    Acetaminophen (Tylenol), Serum <10.0 (*)    All other components within normal limits  CK - Abnormal; Notable for the following:    Total CK 625 (*)    All other components within normal limits  URINE MICROSCOPIC-ADD ON - Abnormal; Notable for the following:    Squamous Epithelial / LPF FEW (*)    Bacteria, UA FEW (*)    Casts HYALINE CASTS (*)    All other components within normal  limits  INFLUENZA PANEL BY PCR (TYPE A & B, H1N1) - Abnormal; Notable for the following:    Influenza A By PCR POSITIVE (*)    H1N1 flu by pcr DETECTED (*)    All other components within normal limits  CBC - Abnormal; Notable for the following:    Platelets 129 (*)    All other components within normal limits  CREATININE, SERUM - Abnormal; Notable for the following:    Creatinine, Ser 1.42 (*)    GFR calc non Af Amer 58 (*)    GFR calc Af Amer 67 (*)    All other components within normal limits  I-STAT CG4 LACTIC ACID, ED - Abnormal; Notable for the following:    Lactic Acid, Venous 2.08 (*)    All other components within normal limits  I-STAT VENOUS BLOOD GAS, ED - Abnormal; Notable for the following:    pH, Ven 7.323 (*)    pCO2, Ven 26.6 (*)    pO2, Ven 70.0 (*)  Bicarbonate 13.8 (*)    Acid-base deficit 10.0 (*)    All other components within normal limits  LIPASE, BLOOD  TROPONIN I  SALICYLATE LEVEL  ETHANOL  COMPREHENSIVE METABOLIC PANEL  CK  HIV ANTIBODY (ROUTINE TESTING)  I-STAT CG4 LACTIC ACID, ED    Imaging Review Dg Chest 2 View  03/14/2015   CLINICAL DATA:  Right rib pain starting Saturday  EXAM: CHEST  2 VIEW  COMPARISON:  06/29/2014  FINDINGS: Cardiomediastinal silhouette is stable. No acute infiltrate or pleural effusion. No pulmonary edema. Mild hyperinflation. No gross fractures are noted. No pneumothorax.  IMPRESSION: No active cardiopulmonary disease.   Electronically Signed   By: Natasha Mead M.D.   On: 03/14/2015 09:02     EKG Interpretation   Date/Time:  Wednesday March 14 2015 08:27:36 EDT Ventricular Rate:  81 PR Interval:  142 QRS Duration: 76 QT Interval:  374 QTC Calculation: 434 R Axis:   -14 Text Interpretation:  Normal sinus rhythm Biatrial enlargement Abnormal  ECG No significant change was found Confirmed by Manus Gunning  MD, Khali Perella  [phone]) on 03/14/2015 8:43:14 AM      MDM   Final diagnoses:  Acute renal failure, unspecified acute  renal failure type  Nausea and vomiting, vomiting of unspecified type   Rib pain with coughing, nausea, vomiting, diarrhea for the past several days. EKG with stable anterior J-point elevation.  Labs remarkable for acute renal failure 2.2. Patient given IV fluids and antiemetics. nonanion gap acidosis. UA with ketones.  CK 600, tox labs negative.  Continue IVF hydration for AKI. Suspect viral syndrome, likely viral gastroenteritis. Check flu swab.  Unassigned admission d/w [facility] residents.    Glynn Octave, MD 03/14/15 [phone]

## 2015-03-15 ENCOUNTER — Observation Stay (HOSPITAL_COMMUNITY): Payer: Self-pay

## 2015-03-15 DIAGNOSIS — N501 Vascular disorders of male genital organs: Secondary | ICD-10-CM

## 2015-03-15 DIAGNOSIS — J101 Influenza due to other identified influenza virus with other respiratory manifestations: Secondary | ICD-10-CM

## 2015-03-15 DIAGNOSIS — R7309 Other abnormal glucose: Secondary | ICD-10-CM

## 2015-03-15 DIAGNOSIS — Z720 Tobacco use: Secondary | ICD-10-CM

## 2015-03-15 DIAGNOSIS — N179 Acute kidney failure, unspecified: Principal | ICD-10-CM

## 2015-03-15 DIAGNOSIS — K219 Gastro-esophageal reflux disease without esophagitis: Secondary | ICD-10-CM

## 2015-03-15 DIAGNOSIS — E872 Acidosis: Secondary | ICD-10-CM

## 2015-03-15 DIAGNOSIS — R944 Abnormal results of kidney function studies: Secondary | ICD-10-CM

## 2015-03-15 DIAGNOSIS — R7301 Impaired fasting glucose: Secondary | ICD-10-CM

## 2015-03-15 DIAGNOSIS — R740 Nonspecific elevation of levels of transaminase and lactic acid dehydrogenase [LDH]: Secondary | ICD-10-CM

## 2015-03-15 DIAGNOSIS — R64 Cachexia: Secondary | ICD-10-CM

## 2015-03-15 LAB — COMPREHENSIVE METABOLIC PANEL
ALK PHOS: 49 U/L (ref 39–117)
ALT: 29 U/L (ref 0–53)
AST: 36 U/L (ref 0–37)
Albumin: 3 g/dL — ABNORMAL LOW (ref 3.5–5.2)
Anion gap: 9 (ref 5–15)
BILIRUBIN TOTAL: 0.7 mg/dL (ref 0.3–1.2)
BUN: 12 mg/dL (ref 6–23)
CHLORIDE: 112 mmol/L (ref 96–112)
CO2: 16 mmol/L — ABNORMAL LOW (ref 19–32)
CREATININE: 1.04 mg/dL (ref 0.50–1.35)
Calcium: 8 mg/dL — ABNORMAL LOW (ref 8.4–10.5)
GFR, EST NON AFRICAN AMERICAN: 84 mL/min — AB (ref >90)
Glucose, Bld: 93 mg/dL (ref 70–99)
Potassium: 4 mmol/L (ref 3.5–5.1)
Sodium: 137 mmol/L (ref 135–145)
TOTAL PROTEIN: 6 g/dL (ref 6.0–8.3)

## 2015-03-15 LAB — MAGNESIUM: Magnesium: 2.2 mg/dL (ref 1.5–2.5)

## 2015-03-15 LAB — PHOSPHORUS: Phosphorus: 1.9 mg/dL — ABNORMAL LOW (ref 2.3–4.6)

## 2015-03-15 LAB — MRSA PCR SCREENING: MRSA BY PCR: NEGATIVE

## 2015-03-15 LAB — CLOSTRIDIUM DIFFICILE BY PCR: Toxigenic C. Difficile by PCR: NEGATIVE

## 2015-03-15 LAB — CK: Total CK: 553 U/L — ABNORMAL HIGH (ref 7–232)

## 2015-03-15 LAB — HIV ANTIBODY (ROUTINE TESTING W REFLEX): HIV Screen 4th Generation wRfx: NONREACTIVE

## 2015-03-15 MED ORDER — DIPHENHYDRAMINE HCL 25 MG PO CAPS
25.0000 mg | ORAL_CAPSULE | Freq: Two times a day (BID) | ORAL | Status: DC | PRN
  Administered 2015-03-15: 25 mg via ORAL
  Filled 2015-03-15: qty 1

## 2015-03-15 MED ORDER — LOPERAMIDE HCL 2 MG PO CAPS: 2.0000 mg | ORAL_CAPSULE | Freq: Once | ORAL | Status: DC

## 2015-03-15 MED ORDER — LOPERAMIDE HCL 2 MG PO CAPS
2.0000 mg | ORAL_CAPSULE | Freq: Once | ORAL | Status: AC
  Administered 2015-03-15: 2 mg via ORAL
  Filled 2015-03-15: qty 1

## 2015-03-15 NOTE — Discharge Summary (Signed)
Name: Gregory Grant MRN: [id number] DOB: 1966-06-04 49 y.o. PCP: No Pcp Per Patient  Date of Admission: 03/14/2015  8:01 AM Date of Discharge: 03/16/2015 Attending Physician: Aletta Edouard, MD  Discharge Diagnosis:  Principal Problem:   Influenza A (H1N1) Active Problems:   AKI (acute kidney injury)   Metabolic acidosis, normal anion gap (NAG)   Elevated AST (SGOT)   Elevated CK   Elevated fasting glucose   Infarction of right testicle   Prediabetes  Discharge Medications:   Medication List    TAKE these medications        cyclobenzaprine 10 MG tablet  Commonly known as:  FLEXERIL  Take 10 mg by mouth 2 (two) times daily as needed for muscle spasms.     omeprazole 20 MG capsule  Commonly known as:  PRILOSEC  Take 1 capsule (20 mg total) by mouth daily.     oseltamivir 75 MG capsule  Commonly known as:  TAMIFLU  Take 1 capsule (75 mg total) by mouth 2 (two) times daily.     traMADol 50 MG tablet  Commonly known as:  ULTRAM  Take 1 tablet (50 mg total) by mouth every 6 (six) hours as needed.        Disposition and follow-up:   Gregory Grant was discharged from [facility] in Stable condition.  At the hospital follow up visit please address:  1.  Please workup his weight loss (see below).   2.  Labs / imaging needed at time of follow-up:   3.  Pending labs/ test needing follow-up:   Follow-up Appointments: Follow-up Information    Follow up with [facility]    .   Why:  Appointment Monday, April 4th at 9:30 am.   Contact information:   [address] [place] [address] [phone]      Discharge Instructions:   Consultations:    Procedures Performed:  Dg Chest 2 View  03/14/2015   CLINICAL DATA:  Right rib pain starting Saturday  EXAM: CHEST  2 VIEW  COMPARISON:  06/29/2014  FINDINGS: Cardiomediastinal silhouette is stable. No acute infiltrate or pleural effusion. No  pulmonary edema. Mild hyperinflation. No gross fractures are noted. No pneumothorax.  IMPRESSION: No active cardiopulmonary disease.   Electronically Signed   By: Natasha Mead M.D.   On: 03/14/2015 09:02   US Scrotum  03/15/2015   CLINICAL DATA:  49 year old male with abnormal right testicle in July 2015. Surveillance. Subsequent encounter.  EXAM: SCROTAL ULTRASOUND  DOPPLER ULTRASOUND OF THE TESTICLES  TECHNIQUE: Complete ultrasound examination of the testicles, epididymis, and other scrotal structures was performed. Color and spectral Doppler ultrasound were also utilized to evaluate blood flow to the testicles.  COMPARISON:  06/29/2014  FINDINGS: Right testicle  Measurements: 4.6 x 1.7 by 2.4 cm (3.9 x 2.5 x 2.8 cm previously). The previously described wedge shaped area of hypo echogenicity appears regressed (image 2), with suggestion of slight convex testicle contour in that region. Otherwise echogenicity is stable and within normal limits.  Left testicle  Measurements: 4.4 x 2.0 x 2.8 cm. No mass or microlithiasis visualized.  Right epididymis:  Normal in size and appearance.  Left epididymis:  Normal in size and appearance.  Hydrocele:  Trace bilateral simple appearing hydroceles.  Varicocele:  None visualized.  Pulsed Doppler interrogation of both testes demonstrates normal low resistance arterial and venous waveforms bilaterally.  IMPRESSION: 1. 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 2. Otherwise stable and negative scrotal ultrasound with Doppler.   Electronically Signed   By: Odessa FlemingH  Hall M.D.   On: 03/15/2015 09:38   [place] Art/ven Flow Abd Pelv Doppler  03/15/2015   CLINICAL DATA:  49 year old male with abnormal right testicle in July 2015. Surveillance. Subsequent encounter.  EXAM: SCROTAL ULTRASOUND  DOPPLER ULTRASOUND OF THE TESTICLES  TECHNIQUE: Complete ultrasound examination of the  testicles, epididymis, and other scrotal structures was performed. Color and spectral Doppler ultrasound were also utilized to evaluate blood flow to the testicles.  COMPARISON:  06/29/2014  FINDINGS: Right testicle  Measurements: 4.6 x 1.7 by 2.4 cm (3.9 x 2.5 x 2.8 cm previously). The previously described wedge shaped area of hypo echogenicity appears regressed (image 2), with suggestion of slight convex testicle contour in that region. Otherwise echogenicity is stable and within normal limits.  Left testicle  Measurements: 4.4 x 2.0 x 2.8 cm. No mass or microlithiasis visualized.  Right epididymis:  Normal in size and appearance.  Left epididymis:  Normal in size and appearance.  Hydrocele:  Trace bilateral simple appearing hydroceles.  Varicocele:  None visualized.  Pulsed Doppler interrogation of both testes demonstrates normal low resistance arterial and venous waveforms bilaterally.  IMPRESSION: 1. 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 2. Otherwise stable and negative scrotal ultrasound with Doppler.   Electronically Signed   By: Odessa FlemingH  Hall M.D.   On: 03/15/2015 09:38    Admission HPI: Pt is a 49 y/o male w/ PMHx of GERD who presents with n/v, generalized malaise, and coughing that began 4 days ago. Pt states he developed a cold and felt weak Saturday after walking in the rain. He also started coughing with subsequent rib pain due to coughing. On Tuesday he started having n/v and diarrhea. Pt had 12 bowel movements yesterday and 2 BMs morning of admission. Denies recent abx use. Denies bloody stools or hemoptysis. He has tried dayquil, nyquil, and theraflu for his symptoms without any relief. He has decreased appetite but has been able to keep fluids down. Pt endorses night sweats, chills, fevers, abdominal pain, and unintentional weight loss. Pt weights 165lbs on presentation and 195lbs  during ED visit on 06/2014. He states he has noticed his jeans getting looser on him, he used to be a size 36 waist and now is size 32. He smokes 1PPD since and has smoked since he was 49 years old. Since being in the ED pt thinks that he feels better as he has not been coughing and has not had any bowel movements. He did not get a flu shot this year and has not been around any sick contacts.   Hospital Course by problem list: Principal Problem:   Influenza A (H1N1) Active Problems:   AKI (acute kidney injury)   Metabolic acidosis, normal anion gap (NAG)   Elevated AST (SGOT)   Elevated CK   Elevated fasting glucose   Infarction of right testicle   Prediabetes   Acute Renal Failure--likely 2/2 to n/v/diarrhea. Creatinine on admission 2.20, b/l around 0.9-1.0. CK 625 that trended down to 553 the next day. Given 2L NS bolus in the ED and continued on NS 100cc/hr. AKI resolved, CK trending down to 354 on discharge.   Influenza A- Pt afebrile and not tachycardic. Pt started feeling sick Saturday, 5 days ago. Started on  tamiflu BID. Pt still having nausea/ vomiting and diarrhea. No leukocytosis on admission. C.diff negative. Continue tamiflu 3 more days.   Unintentional weight loss-- Documented weight of 211lbs on 04/26/14, 195lbs on 06/28/14, and 165 lbs on admission. Likely weight loss due to change in appetite and working more than usual since start his job 2 years ago. Please follow this up outpatient.   Infarction of right testicle-- presented to the ED in 06/2014 for chest pain and rt groin pain. 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 Scrotal u/s at that time was positive for area concerning for infarction that per radiologist can be seen with trauma. Advised to have repeat u/s and f/u with urology which he never did. Ultrasound on 3/31 of scrotum reveals regression of previous rt testicle segmental infarction, otherwise stable and neg scrotal ultrasound.    Discharge Vitals:   BP  144/69 mmHg  Pulse 56  Temp(Src) 99.5 F (37.5 C) (Oral)  Resp 16  Ht  (1.676 m)  Wt 166 lb 0.1 oz (75.3 kg)  BMI 26.81 kg/m2  SpO2 98%  Discharge Labs:  Results for orders placed or performed during the hospital encounter of 03/14/15 (from the past 24 hour(s))  Basic metabolic panel     Status: Abnormal   Collection Time: 03/16/15  9:40 AM  Result Value Ref Range   Sodium 136 135 - 145 mmol/L   Potassium 3.7 3.5 - 5.1 mmol/L   Chloride 108 96 - 112 mmol/L   CO2 21 19 - 32 mmol/L   Glucose, Bld 108 (H) 70 - 99 mg/dL   BUN <5 (L) 6 - 23 mg/dL   Creatinine, Ser 1.47 0.50 - 1.35 mg/dL   Calcium 8.1 (L) 8.4 - 10.5 mg/dL   GFR calc non Af Amer >90 >90 mL/min   GFR calc Af Amer >90 >90 mL/min   Anion gap 7 5 - 15  CK     Status: Abnormal   Collection Time: 03/16/15  9:40 AM  Result Value Ref Range   Total CK 354 (H) 7 - 232 U/L    Signed: Hyacinth Meeker, MD 03/16/2015, 11:07 AM    Services Ordered on Discharge: none Equipment Ordered on Discharge: none

## 2015-03-15 NOTE — Clinical Social Work Note (Signed)
Clinical Social Worker received referral for smoking cessation.  Spoke with RNwho will follow up with patient to discuss smoking cessation and appropriate resources at discharge.    CSW signing off - please re consult if social work needs arise.  Macario GoldsJesse Valencia [place], [place] [phone]

## 2015-03-15 NOTE — Progress Notes (Signed)
Subjective: Pt still having vomiting and diarrhea. Has not tried prn zofran for nausea. Ate half his breakfast tray. States if he goes home and does not go to work he will be bored when his roommate leaves. When asked what he would do if he stayed in the hospital he states he would sleep.   Regarding weight loss, per conversation this morning but has been working the same job for the past 2 years but has been working overtime more and states he sometimes forgets to eat.    Objective: Vital signs in last 24 hours: Filed Vitals:   03/14/15 2142 03/15/15 0500 03/15/15 0627 03/15/15 1001  BP: 125/79     Pulse: 63     Temp: 98.2 F (36.8 C)     TempSrc: Oral     Resp: 20   18  Height:      Weight:  167 lb 8.8 oz (76 kg) 165 lb 9.1 oz (75.1 kg)   SpO2: 99%      Weight change:   Intake/Output Summary (Last 24 hours) at 03/15/15 1109 Last data filed at 03/15/15 1001  Gross per 24 hour  Intake   3370 ml  Output   1550 ml  Net   1820 ml   General: NAD, laying in bed comfortably Lungs: CTAB, no wheezing Cardiac: RRR, no murmurs GI: soft, active bowel sounds, TTP of rib costal margin Neuro: CN II-XII grossly intact  Lab Results: Basic Metabolic Panel:  Recent Labs Lab 03/14/15 0841 03/14/15 1554 03/15/15 0625  NA 133*  --  137  K 5.1  --  4.0  CL 105  --  112  CO2 15*  --  16*  GLUCOSE 100*  --  93  BUN 34*  --  12  CREATININE 2.20* 1.42* 1.04  CALCIUM 9.2  --  8.0*  MG  --   --  2.2  PHOS  --   --  1.9*   Liver Function Tests:  Recent Labs Lab 03/14/15 0841 03/15/15 0625  AST 73* 36  ALT 41 29  ALKPHOS 68 49  BILITOT 2.2* 0.7  PROT 7.9 6.0  ALBUMIN 4.0 3.0*    Recent Labs Lab 03/14/15 0841  LIPASE 34   CBC:  Recent Labs Lab 03/14/15 0841 03/14/15 1554  WBC 6.2 4.8  NEUTROABS 3.6  --   HGB 18.2* 15.1  HCT 51.0 46.2  MCV 94.3 97.7  PLT 183 129*   Cardiac Enzymes:  Recent Labs Lab 03/14/15 0841 03/14/15 1050 03/15/15 0625  CKTOTAL  --   625* 553*  TROPONINI <0.03  --   --     Studies/Results: Dg Chest 2 View  03/14/2015   CLINICAL DATA:  Right rib pain starting Saturday  EXAM: CHEST  2 VIEW  COMPARISON:  06/29/2014  FINDINGS: Cardiomediastinal silhouette is stable. No acute infiltrate or pleural effusion. No pulmonary edema. Mild hyperinflation. No gross fractures are noted. No pneumothorax.  IMPRESSION: No active cardiopulmonary disease.   Electronically Signed   By: Natasha Mead M.D.   On: 03/14/2015 09:02   US Scrotum  03/15/2015   CLINICAL DATA:  49 year old male with abnormal right testicle in July 2015. Surveillance. Subsequent encounter.  EXAM: SCROTAL ULTRASOUND  DOPPLER ULTRASOUND OF THE TESTICLES  TECHNIQUE: Complete ultrasound examination of the testicles, epididymis, and other scrotal structures was performed. Color and spectral Doppler ultrasound were also utilized to evaluate blood flow to the testicles.  COMPARISON:  06/29/2014  FINDINGS: Right testicle  Measurements:  4.6 x 1.7 by 2.4 cm (3.9 x 2.5 x 2.8 cm previously). The previously described wedge shaped area of hypo echogenicity appears regressed (image 2), with suggestion of slight convex testicle contour in that region. Otherwise echogenicity is stable and within normal limits.  Left testicle  Measurements: 4.4 x 2.0 x 2.8 cm. No mass or microlithiasis visualized.  Right epididymis:  Normal in size and appearance.  Left epididymis:  Normal in size and appearance.  Hydrocele:  Trace bilateral simple appearing hydroceles.  Varicocele:  None visualized.  Pulsed Doppler interrogation of both testes demonstrates normal low resistance arterial and venous waveforms bilaterally.  IMPRESSION: 1. 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 2. Otherwise stable and negative scrotal ultrasound with Doppler.   Electronically Signed   By: Odessa Fleming M.D.   On: 03/15/2015 09:38    [place] Art/ven Flow Abd Pelv Doppler  03/15/2015   CLINICAL DATA:  49 year old male with abnormal right testicle in July 2015. Surveillance. Subsequent encounter.  EXAM: SCROTAL ULTRASOUND  DOPPLER ULTRASOUND OF THE TESTICLES  TECHNIQUE: Complete ultrasound examination of the testicles, epididymis, and other scrotal structures was performed. Color and spectral Doppler ultrasound were also utilized to evaluate blood flow to the testicles.  COMPARISON:  06/29/2014  FINDINGS: Right testicle  Measurements: 4.6 x 1.7 by 2.4 cm (3.9 x 2.5 x 2.8 cm previously). The previously described wedge shaped area of hypo echogenicity appears regressed (image 2), with suggestion of slight convex testicle contour in that region. Otherwise echogenicity is stable and within normal limits.  Left testicle  Measurements: 4.4 x 2.0 x 2.8 cm. No mass or microlithiasis visualized.  Right epididymis:  Normal in size and appearance.  Left epididymis:  Normal in size and appearance.  Hydrocele:  Trace bilateral simple appearing hydroceles.  Varicocele:  None visualized.  Pulsed Doppler interrogation of both testes demonstrates normal low resistance arterial and venous waveforms bilaterally.  IMPRESSION: 1. 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 2. Otherwise stable and negative scrotal ultrasound with Doppler.   Electronically Signed   By: Odessa Fleming M.D.   On: 03/15/2015 09:38   Medications: I have reviewed the patient's current medications. Scheduled Meds: . feeding supplement (ENSURE ENLIVE)  237 mL Oral BID BM  . heparin  5,000 Units Subcutaneous 3 times per day  . oseltamivir  75 mg Oral BID   Continuous Infusions: . sodium chloride 150 mL/hr at 03/15/15 1008   PRN Meds:.acetaminophen **OR** acetaminophen, diphenhydrAMINE, ondansetron **OR** ondansetron (ZOFRAN) IV Assessment/Plan: Principal Problem:   Influenza A  (H1N1) Active Problems:   AKI (acute kidney injury)   Metabolic acidosis, normal anion gap (NAG)   Elevated AST (SGOT)   Elevated CK   Elevated fasting glucose   Infarction of right testicle   Acute Renal Failure--resolving. Cr 1.04, b/l around 0.9-1.0. CK trending down to 553 from 625 on admission - continue NS at 150cc/hr - continue to monitor  Influenza A-  Pt afebrile and not tachycardic. Pt started feeling sick Saturday, 5 days ago. Started on tamiflu BID. Pt still having nausea/ vomiting and diarrhea. No leukocytosis on admission - IVFs as above - tylenol prn for fever - zofran for nausea - imodium for diarrhea - C diff pending - can discharge pt once n/v and diarrhea controlled  Unintentional weight loss-- Documented weight of 211lbs on  04/26/14, 195lbs on 06/28/14, and 165 lbs on admission. Likely weight loss due to change in appetite and working more than usual since start his job 2 years ago.  - will continue to monitor and set pt up with PCP - case management consulted to assist establishing pt with [place] and wellness  Hx of groin pain-- presented to the ED in 06/2014 for chest pain and rt groin pain. 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 Scrotal u/s at that time was positive for area concerning for infarction that per radiologist can be seen with trauma. Advised to have repeat u/s and f/u with urology which he never did - u/s today of scrotum reveals regression of previous rt testicle segmental infarction, otherwise stable and neg scrotal ultrasound.   GERD-- on prilosec 20mg  at home which he takes occasionally. Denies acid reflux symptoms. Can give protonix 20mg  for acid reflux if needed.   Tobacco abuse - social work consulted for smoking cessation  FEN - reg diet and NS at 150 cc/hr  DVT ppx- hep [place]  Dispo: Anticipated discharge in approximately 1-2 day(s).   The patient does not have a current PCP (No Pcp Per Patient) and [facility] not [facility] after discharge.  The patient does not have transportation limitations that hinder transportation to clinic appointments. .Services Needed at time of discharge: Y = Yes, Blank = No PT:   OT:   RN:   Equipment:   Other:       Denton Brickiana M Raiford Fetterman, MD 03/15/2015, 11:09 AM

## 2015-03-15 NOTE — Progress Notes (Signed)
Nutrition Brief Note  Patient identified on the Malnutrition Screening Tool (MST) Report  Wt Readings from Last 15 Encounters:  03/15/15 165 lb 9.1 oz (75.1 kg)  06/28/14 195 lb (88.451 kg)  04/26/14 211 lb (95.709 kg)  10/15/13 160 lb (72.576 kg)    Body mass index is 26.74 kg/(m^2). Patient meets criteria for overweight based on current BMI.   Current diet order is regular, patient is consuming approximately 100% of meals at this time. Labs and medications reviewed.   No nutrition interventions warranted at this time. If nutrition issues arise, please consult RD.   Shasha Buchbinder A. Mayford KnifeWilliams, RD, LDN, CDE Pager: [phone] After hours Pager: [phone]

## 2015-03-15 NOTE — Progress Notes (Signed)
UR completed 

## 2015-03-16 DIAGNOSIS — R7303 Prediabetes: Secondary | ICD-10-CM | POA: Diagnosis present

## 2015-03-16 LAB — BASIC METABOLIC PANEL
Anion gap: 7 (ref 5–15)
CHLORIDE: 108 mmol/L (ref 96–112)
CO2: 21 mmol/L (ref 19–32)
Calcium: 8.1 mg/dL — ABNORMAL LOW (ref 8.4–10.5)
Creatinine, Ser: 0.91 mg/dL (ref 0.50–1.35)
GFR calc Af Amer: >90 mL/min (ref >90)
GLUCOSE: 108 mg/dL — AB (ref 70–99)
POTASSIUM: 3.7 mmol/L (ref 3.5–5.1)
SODIUM: 136 mmol/L (ref 135–145)

## 2015-03-16 LAB — HEPATITIS PANEL, ACUTE
HCV AB: NEGATIVE
HEP A IGM: NONREACTIVE
Hep B C IgM: NONREACTIVE
Hepatitis B Surface Ag: NEGATIVE

## 2015-03-16 LAB — HEMOGLOBIN A1C
HEMOGLOBIN A1C: 5.9 % — AB (ref 4.8–5.6)
MEAN PLASMA GLUCOSE: 123 mg/dL

## 2015-03-16 LAB — CK: CK TOTAL: 354 U/L — AB (ref 7–232)

## 2015-03-16 MED ORDER — OSELTAMIVIR PHOSPHATE 75 MG PO CAPS
75.0000 mg | ORAL_CAPSULE | Freq: Two times a day (BID) | ORAL | Status: DC
Start: 2015-03-16 — End: 2015-12-19

## 2015-03-16 MED ORDER — ONDANSETRON HCL 4 MG PO TABS: 4.0000 mg | ORAL_TABLET | Freq: Four times a day (QID) | ORAL | Status: DC | PRN

## 2015-03-16 NOTE — Progress Notes (Signed)
Subjective: Doing well. Wants to go home. No longer having n/v/diarrhea. Was able to keep down his food yesterday. Has some mild cough but no other symptoms.    Objective: Vital signs in last 24 hours: Filed Vitals:   03/15/15 1326 03/15/15 2135 03/16/15 0508 03/16/15 0509  BP: 151/78 142/72  144/69  Pulse: 71 55  56  Temp: 98.9 F (37.2 C) 99.6 F (37.6 C)  99.5 F (37.5 C)  TempSrc: Oral Oral  Oral  Resp: Height:      Weight:   166 lb 0.1 oz (75.3 kg)   SpO2: 99% 100%  98%   Weight change: 1.7 oz (0.048 kg)  Intake/Output Summary (Last 24 hours) at 03/16/15 0715 Last data filed at 03/16/15 0338  Gross per 24 hour  Intake 3520.83 ml  Output    500 ml  Net 3020.83 ml   General: NAD, laying on bed comfortably.  Lungs: CTAB, no wheezing Cardiac: RRR, no murmurs GI: soft, active bowel sounds. No TTP. Neuro: CN II-XII grossly intact  Lab Results: Basic Metabolic Panel:  Recent Labs Lab 03/14/15 0841 03/14/15 1554 03/15/15 0625  NA 133*  --  137  K 5.1  --  4.0  CL 105  --  112  CO2 15*  --  16*  GLUCOSE 100*  --  93  BUN 34*  --  12  CREATININE 2.20* 1.42* 1.04  CALCIUM 9.2  --  8.0*  MG  --   --  2.2  PHOS  --   --  1.9*   Liver Function Tests:  Recent Labs Lab 03/14/15 0841 03/15/15 0625  AST 73* 36  ALT 41 29  ALKPHOS 68 49  BILITOT 2.2* 0.7  PROT 7.9 6.0  ALBUMIN 4.0 3.0*    Recent Labs Lab 03/14/15 0841  LIPASE 34   CBC:  Recent Labs Lab 03/14/15 0841 03/14/15 1554  WBC 6.2 4.8  NEUTROABS 3.6  --   HGB 18.2* 15.1  HCT 51.0 46.2  MCV 94.3 97.7  PLT 183 129*   Cardiac Enzymes:  Recent Labs Lab 03/14/15 0841 03/14/15 1050 03/15/15 0625  CKTOTAL  --  625* 553*  TROPONINI <0.03  --   --     Studies/Results: Dg Chest 2 View  03/14/2015   CLINICAL DATA:  Right rib pain starting Saturday  EXAM: CHEST  2 VIEW  COMPARISON:  06/29/2014  FINDINGS: Cardiomediastinal silhouette is stable. No acute infiltrate or  pleural effusion. No pulmonary edema. Mild hyperinflation. No gross fractures are noted. No pneumothorax.  IMPRESSION: No active cardiopulmonary disease.   Electronically Signed   By: Natasha Mead M.D.   On: 03/14/2015 09:02   US Scrotum  03/15/2015   CLINICAL DATA:  49 year old male with abnormal right testicle in July 2015. Surveillance. Subsequent encounter.  EXAM: SCROTAL ULTRASOUND  DOPPLER ULTRASOUND OF THE TESTICLES  TECHNIQUE: Complete ultrasound examination of the testicles, epididymis, and other scrotal structures was performed. Color and spectral Doppler ultrasound were also utilized to evaluate blood flow to the testicles.  COMPARISON:  06/29/2014  FINDINGS: Right testicle  Measurements: 4.6 x 1.7 by 2.4 cm (3.9 x 2.5 x 2.8 cm previously). The previously described wedge shaped area of hypo echogenicity appears regressed (image 2), with suggestion of slight convex testicle contour in that region. Otherwise echogenicity is stable and within normal limits.  Left testicle  Measurements: 4.4 x 2.0 x 2.8 cm. No mass or microlithiasis visualized.  Right epididymis:  Normal in size and appearance.  Left epididymis:  Normal in size and appearance.  Hydrocele:  Trace bilateral simple appearing hydroceles.  Varicocele:  None visualized.  Pulsed Doppler interrogation of both testes demonstrates normal low resistance arterial and venous waveforms bilaterally.  IMPRESSION: 1. 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 2. Otherwise stable and negative scrotal ultrasound with Doppler.   Electronically Signed   By: Odessa Fleming M.D.   On: 03/15/2015 09:38   [place] Art/ven Flow Abd Pelv Doppler  03/15/2015   CLINICAL DATA:  49 year old male with abnormal right testicle in July 2015. Surveillance. Subsequent encounter.  EXAM: SCROTAL ULTRASOUND  DOPPLER ULTRASOUND OF THE TESTICLES  TECHNIQUE: Complete ultrasound  examination of the testicles, epididymis, and other scrotal structures was performed. Color and spectral Doppler ultrasound were also utilized to evaluate blood flow to the testicles.  COMPARISON:  06/29/2014  FINDINGS: Right testicle  Measurements: 4.6 x 1.7 by 2.4 cm (3.9 x 2.5 x 2.8 cm previously). The previously described wedge shaped area of hypo echogenicity appears regressed (image 2), with suggestion of slight convex testicle contour in that region. Otherwise echogenicity is stable and within normal limits.  Left testicle  Measurements: 4.4 x 2.0 x 2.8 cm. No mass or microlithiasis visualized.  Right epididymis:  Normal in size and appearance.  Left epididymis:  Normal in size and appearance.  Hydrocele:  Trace bilateral simple appearing hydroceles.  Varicocele:  None visualized.  Pulsed Doppler interrogation of both testes demonstrates normal low resistance arterial and venous waveforms bilaterally.  IMPRESSION: 1. 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 2. Otherwise stable and negative scrotal ultrasound with Doppler.   Electronically Signed   By: Odessa Fleming M.D.   On: 03/15/2015 09:38   Medications: I have reviewed the patient's current medications. Scheduled Meds: . feeding supplement (ENSURE ENLIVE)  237 mL Oral BID BM  . heparin  5,000 Units Subcutaneous 3 times per day  . oseltamivir  75 mg Oral BID   Continuous Infusions: . sodium chloride 100 mL/hr at 03/16/15 0330   PRN Meds:.acetaminophen **OR** acetaminophen, diphenhydrAMINE, ondansetron **OR** ondansetron (ZOFRAN) IV Assessment/Plan: Principal Problem:   Influenza A (H1N1) Active Problems:   AKI (acute kidney injury)   Metabolic acidosis, normal anion gap (NAG)   Elevated AST (SGOT)   Elevated CK   Elevated fasting glucose   Infarction of right testicle   Acute Renal Failure-- likely n/v/Diarrhea. resolved. Cr  1.04, b/l around 0.9-1.0. CK trending down to 553 from 625 on admission - was on NS at 100cc/hr >> able to eat. Stop fluid.   Influenza A-  Pt afebrile and not tachycardic. Pt started feeling sick Saturday. Started on tamiflu BID. No leukocytosis on admission - finish course of tamiflu at home.  Unintentional weight loss-- Documented weight of 211lbs on 04/26/14, 195lbs on 06/28/14, and 165 lbs on admission. Likely weight loss due to change in appetite and working more than usual since start his job 2 years ago.  - case management consulted to assist establishing pt with [place] and wellness  Hx of groin pain-- presented to the ED in 06/2014 for chest pain and rt groin pain. 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 Scrotal u/s at that time was positive for area concerning for infarction that per radiologist can be seen with trauma.  Advised to have repeat u/s and f/u with urology which he never did - u/s scrotum reveals regression of previous rt testicle segmental infarction, otherwise stable and neg scrotal ultrasound.   GERD-- on prilosec 20mg  at home which he takes occasionally. Denies acid reflux symptoms. Can give protonix 20mg  for acid reflux if needed.   Tobacco abuse - social work consulted for smoking cessation  FEN - reg diet and NS at 100 cc/hr  DVT ppx- hep [place]  Dispo: Anticipated discharge today.  The patient does not have a current PCP (No Pcp Per Patient) and [facility] not [facility] after discharge.  The patient does not have transportation limitations that hinder transportation to clinic appointments. .Services Needed at time of discharge: Y = Yes, Blank = No PT:   OT:   RN:   Equipment:   Other:     LOS: 1 day   Hyacinth Meekerasrif Helton Oleson, MD 03/16/2015, 7:15 AM

## 2015-03-16 NOTE — Progress Notes (Signed)
Patient discharge teaching given, including activity, diet, follow-up appoints, and medications. Patient verbalized understanding of all discharge instructions. IV access was d/c'd. Vitals are stable. Skin is intact except as charted in most recent assessments. Pt to be escorted out by NT, to be driven home by family. 

## 2015-03-19 ENCOUNTER — Inpatient Hospital Stay: Payer: Self-pay | Admitting: Family Medicine

## 2015-08-15 ENCOUNTER — Emergency Department (HOSPITAL_COMMUNITY)
Admission: EM | Admit: 2015-08-15 | Discharge: 2015-08-15 | Disposition: A | Discharge status: Home / Self Care | Payer: Self-pay | Attending: Emergency Medicine | Admitting: Emergency Medicine

## 2015-08-15 ENCOUNTER — Encounter (HOSPITAL_COMMUNITY): Payer: Self-pay | Admitting: Emergency Medicine

## 2015-08-15 ENCOUNTER — Emergency Department (HOSPITAL_COMMUNITY): Payer: Self-pay

## 2015-08-15 DIAGNOSIS — K219 Gastro-esophageal reflux disease without esophagitis: Secondary | ICD-10-CM | POA: Insufficient documentation

## 2015-08-15 DIAGNOSIS — Z720 Tobacco use: Secondary | ICD-10-CM | POA: Insufficient documentation

## 2015-08-15 DIAGNOSIS — W01198A Fall on same level from slipping, tripping and stumbling with subsequent striking against other object, initial encounter: Secondary | ICD-10-CM | POA: Insufficient documentation

## 2015-08-15 DIAGNOSIS — S20211A Contusion of right front wall of thorax, initial encounter: Secondary | ICD-10-CM | POA: Insufficient documentation

## 2015-08-15 DIAGNOSIS — Y998 Other external cause status: Secondary | ICD-10-CM | POA: Insufficient documentation

## 2015-08-15 DIAGNOSIS — Y9372 Activity, wrestling: Secondary | ICD-10-CM | POA: Insufficient documentation

## 2015-08-15 DIAGNOSIS — Y9289 Other specified places as the place of occurrence of the external cause: Secondary | ICD-10-CM | POA: Insufficient documentation

## 2015-08-15 MED ORDER — IBUPROFEN 800 MG PO TABS: 800.0000 mg | ORAL_TABLET | Freq: Three times a day (TID) | ORAL | Status: DC

## 2015-08-15 MED ORDER — TRAMADOL HCL 50 MG PO TABS: 50.0000 mg | ORAL_TABLET | Freq: Four times a day (QID) | ORAL | Status: DC | PRN

## 2015-08-15 MED ORDER — OXYCODONE-ACETAMINOPHEN 5-325 MG PO TABS
1.0000 | ORAL_TABLET | Freq: Once | ORAL | Status: AC
  Administered 2015-08-15: 1 via ORAL
  Filled 2015-08-15: qty 1
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
## 2015-08-15 NOTE — Discharge Instructions (Signed)
Heat Therapy Heat therapy can help ease sore, stiff, injured, and tight muscles and joints. Heat relaxes your muscles, which may help ease your pain.  RISKS AND COMPLICATIONS If you have any of the following conditions, do not use heat therapy unless your health care provider has approved:  Poor circulation.  Healing wounds or scarred skin in the area being treated.  Diabetes, heart disease, or high blood pressure.  Not being able to feel (numbness) the area being treated.  Unusual swelling of the area being treated.  Active infections.  Blood clots.  Cancer.  Inability to communicate pain. This may include young children and people who have problems with their brain function (dementia).  Pregnancy. Heat therapy should only be used on old, pre-existing, or long-lasting (chronic) injuries. Do not use heat therapy on new injuries unless directed by your health care provider. HOW TO USE HEAT THERAPY There are several different kinds of heat therapy, including:  Moist heat pack.  Warm water bath.  Hot water bottle.  Electric heating pad.  Heated gel pack.  Heated wrap.  Electric heating pad. Use the heat therapy method suggested by your health care provider. Follow your health care provider's instructions on when and how to use heat therapy. GENERAL HEAT THERAPY RECOMMENDATIONS  Do not sleep while using heat therapy. Only use heat therapy while you are awake.  Your skin may turn pink while using heat therapy. Do not use heat therapy if your skin turns red.  Do not use heat therapy if you have new pain.  High heat or long exposure to heat can cause burns. Be careful when using heat therapy to avoid burning your skin.  Do not use heat therapy on areas of your skin that are already irritated, such as with a rash or sunburn. SEEK MEDICAL CARE IF:  You have blisters, redness, swelling, or numbness.  You have new pain.  Your pain is worse. MAKE SURE  YOU:  Understand these instructions.  Will watch your condition.  Will get help right away if you are not doing well or get worse. Document Released: 02/23/2012 Document Revised: 04/17/2014 Document Reviewed: 01/24/2014 [facility] Patient Information 2015 [place], [place]. This information is not intended to replace advice given to you by your health care provider. Make sure you discuss any questions you have with your health care provider. Chest Wall Pain Chest wall pain is pain in or around the bones and muscles of your chest. It may take up to 6 weeks to get better. It may take longer if you must stay physically active in your work and activities.  CAUSES  Chest wall pain may happen on its own. However, it may be caused by:  A viral illness like the flu.  Injury.  Coughing.  Exercise.  Arthritis.  Fibromyalgia.  Shingles. HOME CARE INSTRUCTIONS   Avoid overtiring physical activity. Try not to strain or perform activities that cause pain. This includes any activities using your chest or your abdominal and side muscles, especially if heavy weights are used.  Put ice on the sore area.  Put ice in a plastic bag.  Place a towel between your skin and the bag.  Leave the ice on for 15-20 minutes per hour while awake for the first 2 days.  Only take over-the-counter or prescription medicines for pain, discomfort, or fever as directed by your caregiver. SEEK IMMEDIATE MEDICAL CARE IF:   Your pain increases, or you are very uncomfortable.  You have a fever.  Your  chest pain becomes worse. °· You have new, unexplained symptoms. °· You have nausea or vomiting. °· You feel sweaty or lightheaded. °· You have a cough with phlegm (sputum), or you cough up blood. °MAKE SURE YOU:  °· Understand these instructions. °· Will watch your condition. °· Will get help right away if you are not doing well or get worse. °Document Released: 12/01/2005 Document Revised: 02/23/2012 Document Reviewed:  07/28/2011 °ExitCare® Patient Information ©2015 [employer] LLC. This information is not intended to replace advice given to you by your health care provider. Make sure you discuss any questions you have with your health care provider. ° °

## 2015-12-19 ENCOUNTER — Emergency Department (HOSPITAL_COMMUNITY)
Admission: EM | Admit: 2015-12-19 | Discharge: 2015-12-20 | Disposition: A | Discharge status: Home / Self Care | Payer: Self-pay | Attending: Emergency Medicine | Admitting: Emergency Medicine

## 2015-12-19 ENCOUNTER — Encounter (HOSPITAL_COMMUNITY): Payer: Self-pay | Admitting: Emergency Medicine

## 2015-12-19 DIAGNOSIS — R1013 Epigastric pain: Secondary | ICD-10-CM | POA: Insufficient documentation

## 2015-12-19 DIAGNOSIS — K219 Gastro-esophageal reflux disease without esophagitis: Secondary | ICD-10-CM | POA: Insufficient documentation

## 2015-12-19 DIAGNOSIS — F17210 Nicotine dependence, cigarettes, uncomplicated: Secondary | ICD-10-CM | POA: Insufficient documentation

## 2015-12-19 DIAGNOSIS — R69 Illness, unspecified: Secondary | ICD-10-CM

## 2015-12-19 DIAGNOSIS — J111 Influenza due to unidentified influenza virus with other respiratory manifestations: Secondary | ICD-10-CM | POA: Insufficient documentation

## 2015-12-19 DIAGNOSIS — I10 Essential (primary) hypertension: Secondary | ICD-10-CM | POA: Insufficient documentation

## 2015-12-19 DIAGNOSIS — R079 Chest pain, unspecified: Secondary | ICD-10-CM | POA: Insufficient documentation

## 2015-12-19 LAB — CBC
HCT: 47 % (ref 39.0–52.0)
Hemoglobin: 16.3 g/dL (ref 13.0–17.0)
MCH: 32.9 pg (ref 26.0–34.0)
MCHC: 34.7 g/dL (ref 30.0–36.0)
MCV: 94.9 fL (ref 78.0–100.0)
Platelets: 151 10*3/uL (ref 150–400)
RBC: 4.95 MIL/uL (ref 4.22–5.81)
RDW: 13.8 % (ref 11.5–15.5)
WBC: 10 10*3/uL (ref 4.0–10.5)

## 2015-12-19 LAB — COMPREHENSIVE METABOLIC PANEL
ALT: 33 U/L (ref 17–63)
AST: 29 U/L (ref 15–41)
Albumin: 4.1 g/dL (ref 3.5–5.0)
Alkaline Phosphatase: 64 U/L (ref 38–126)
Anion gap: 17 — ABNORMAL HIGH (ref 5–15)
BUN: 8 mg/dL (ref 6–20)
CO2: 19 mmol/L — ABNORMAL LOW (ref 22–32)
Calcium: 10 mg/dL (ref 8.9–10.3)
Chloride: 103 mmol/L (ref 101–111)
Creatinine, Ser: 1.05 mg/dL (ref 0.61–1.24)
GFR calc Af Amer: >60 mL/min (ref >60)
GFR calc non Af Amer: >60 mL/min (ref >60)
Glucose, Bld: 99 mg/dL (ref 65–99)
Potassium: 3.5 mmol/L (ref 3.5–5.1)
SODIUM: 139 mmol/L (ref 135–145)
Total Bilirubin: 0.5 mg/dL (ref 0.3–1.2)
Total Protein: 7.4 g/dL (ref 6.5–8.1)

## 2015-12-19 LAB — LIPASE, BLOOD: LIPASE: 23 U/L (ref 11–51)

## 2015-12-19 MED ORDER — SODIUM CHLORIDE 0.9 % IV BOLUS (SEPSIS)
1000.0000 mL | Freq: Once | INTRAVENOUS | Status: AC
  Administered 2015-12-19: 1000 mL via INTRAVENOUS

## 2015-12-19 MED ORDER — KETOROLAC TROMETHAMINE 15 MG/ML IJ SOLN
15.0000 mg | Freq: Once | INTRAMUSCULAR | Status: AC
  Administered 2015-12-19: 15 mg via INTRAVENOUS
  Filled 2015-12-19: qty 1

## 2015-12-19 MED ORDER — ONDANSETRON 4 MG PO TBDP
4.0000 mg | ORAL_TABLET | Freq: Once | ORAL | Status: AC | PRN
  Administered 2015-12-19: 4 mg via ORAL
  Filled 2015-12-19: qty 1

## 2015-12-19 MED ORDER — SUCRALFATE 1 GM/10ML PO SUSP
1.0000 g | Freq: Three times a day (TID) | ORAL | Status: DC
  Filled 2015-12-19 (×5): qty 10

## 2015-12-19 NOTE — ED Notes (Signed)
Pt stated he is unable to give a urine sample at this time. 

## 2015-12-19 NOTE — ED Notes (Signed)
Per EMS, N/V/D x 1 week. Coughing and dry heaving often. Chest pain with coughing, 12 lead in route WNL. Chills present.

## 2015-12-19 NOTE — ED Provider Notes (Signed)
CSN: [id number]     Arrival date & time 12/19/15  1839 History  By signing my name below, I, Soijett Blue, attest that this documentation has been prepared under the direction and in the presence of Gerhard Munchobert Jaevian Shean, MD. Electronically Signed: Soijett Blue, ED Scribe. 12/19/2015. 11:26 PM.   Chief Complaint  Patient presents with  . Emesis  . Diarrhea  . Chest Pain      The history is provided by the patient. No language interpreter was used.    HPI Comments: Gregory Grant is a 50 y.o. male with a medical hx of GERD who presents to the Emergency Department via EMS complaining of nausea with associated vomiting/dry heaving onset 1 week. He notes that prior to the onset of his symptoms, he was feeling fine, but notes that he does have bad acid reflux. He states that he is having associated symptoms of diarrhea, chills, fever, mild periumbilical abdominal pain x 2 days, post-tussive cough, and sternal CP due to cough. He states that he has tried robitussin-DM x 2 days with mild relief for his symptoms. He denies any other symptoms. He reports that he is otherwise healthy.     Past Medical History  Diagnosis Date  . GERD (gastroesophageal reflux disease)   . Coughing 02/2015   History reviewed. No pertinent past surgical history. History reviewed. No pertinent family history. Social History  Substance Use Topics  . Smoking status: Current Every Day Smoker -- 0.50 packs/day for 30 years    Types: Cigarettes  . Smokeless tobacco: Never Used  . Alcohol Use: 0.6 oz/week    1 Cans of beer per week    Review of Systems  Constitutional:       Per HPI, otherwise negative  HENT:       Per HPI, otherwise negative  Respiratory:       Per HPI, otherwise negative  Cardiovascular: Positive for chest pain.       Per HPI, otherwise negative  Gastrointestinal: Positive for vomiting and diarrhea.  Endocrine:       Negative aside from HPI  Genitourinary:       Neg aside from HPI    Musculoskeletal:       Per HPI, otherwise negative  Skin: Negative.   Neurological: Negative for syncope.      Allergies  Review of patient's allergies indicates no known allergies.  Home Medications   Prior to Admission medications   Medication Sig Start Date End Date Taking? Authorizing Provider  guaiFENesin-dextromethorphan (ROBITUSSIN DM) 100-10 MG/5ML syrup Take 15 mLs by mouth every 4 (four) hours as needed for cough.   Yes Historical Provider, MD  ibuprofen (ADVIL,MOTRIN) 800 MG tablet Take 1 tablet (800 mg total) by mouth 3 (three) times daily. Patient not taking: Reported on 12/19/2015 08/15/15   Elpidio AnisShari Upstill, PA-C  omeprazole (PRILOSEC) 20 MG capsule Take 1 capsule (20 mg total) by mouth daily. Patient not taking: Reported on 08/15/2015 04/26/14   Dahlia ClientHannah Muthersbaugh, PA-C  ondansetron (ZOFRAN) 4 MG tablet Take 1 tablet (4 mg total) by mouth every 6 (six) hours as needed for nausea. Patient not taking: Reported on 08/15/2015 03/16/15   Hyacinth Meekerasrif Ahmed, MD  traMADol (ULTRAM) 50 MG tablet Take 1 tablet (50 mg total) by mouth every 6 (six) hours as needed. Patient not taking: Reported on 12/19/2015 08/15/15   Elpidio AnisShari Upstill, PA-C   BP 158/98 mmHg  Pulse 52  Temp(Src) 97.6 F (36.4 C) (Oral)  Resp 18  SpO2 100% Physical Exam  Constitutional: He is oriented to person, place, and time. He appears well-developed. No distress.  HENT:  Head: Normocephalic and atraumatic.  Eyes: Conjunctivae and EOM are normal.  Cardiovascular: Normal rate and regular rhythm.   Pulmonary/Chest: Effort normal. No stridor. No respiratory distress.  Abdominal: He exhibits no distension.  Minimal TTP about the epigastrum, no guarding  Musculoskeletal: He exhibits no edema.  Neurological: He is alert and oriented to person, place, and time.  Skin: Skin is warm and dry.  Psychiatric: He has a normal mood and affect.  Nursing note and vitals reviewed.   ED Course  Procedures (including critical care  time) DIAGNOSTIC STUDIES: Oxygen Saturation is 100% on RA, nl by my interpretation.    COORDINATION OF CARE: 11:21 PM Discussed treatment plan with pt at bedside which includes labs, EKG, CXR, UA, and pt agreed to plan.    Labs Review Labs Reviewed  COMPREHENSIVE METABOLIC PANEL - Abnormal; Notable for the following:    CO2 19 (*)    Anion gap 17 (*)    All other components within normal limits  LIPASE, BLOOD  CBC  URINALYSIS, ROUTINE W REFLEX MICROSCOPIC (NOT AT [facility])  URINALYSIS W MICROSCOPIC (NOT AT [facility])    Imaging Review No results found. I have personally reviewed and evaluated these images and lab results as part of my medical decision-making.   EKG Interpretation   Date/Time:  Wednesday December 19 2015 19:01:06 EST Ventricular Rate:  57 PR Interval:  145 QRS Duration: 89 QT Interval:  437 QTC Calculation: 425 R Axis:   -17 Text Interpretation:  Sinus rhythm Borderline left axis deviation Abnormal  R-wave progression, early transition ST elevation, consider inferior  injury NO SIG CHANGE FROM OLD Confirmed by Donnald Garre, MD, Lebron Conners [phone]) on  12/19/2015 8:09:35 PM     2:15 AM Following fluid resuscitation the patient states that he feels substantially better. Requests cold medication, antacid medication MDM  Patient presents with one week of generalized illness. Here, the patient is awake, alert, mildly hypertensive, but with no evidence for end organ effects.  Patient has a soft, non-peritoneal abdomen, no evidence for pneumonia, bacteremia, sepsis. With his improvement he was discharged in stable condition to follow-up with primary care.  Gerhard Munch, MD 12/20/15 [phone]

## 2015-12-20 ENCOUNTER — Emergency Department (HOSPITAL_COMMUNITY): Payer: Self-pay

## 2015-12-20 MED ORDER — OMEPRAZOLE 20 MG PO CPDR: 20.0000 mg | DELAYED_RELEASE_CAPSULE | Freq: Every day | ORAL | Status: DC

## 2015-12-20 MED ORDER — ONDANSETRON 4 MG PO TBDP: 4.0000 mg | ORAL_TABLET | Freq: Once | ORAL | Status: DC | PRN

## 2015-12-20 MED ORDER — SODIUM CHLORIDE 0.9 % IV BOLUS (SEPSIS): 1000.0000 mL | Freq: Once | INTRAVENOUS | Status: DC

## 2015-12-20 MED ORDER — CHLORPHENIRAMINE-PHENYLEPHRINE 1-3.5 MG/ML PO LIQD: 0.7500 mL | Freq: Four times a day (QID) | ORAL | Status: DC | PRN

## 2015-12-20 NOTE — Discharge Instructions (Signed)
As discussed, your evaluation today has been largely reassuring.  But, it is important that you monitor your condition carefully, and do not hesitate to return to the ED if you develop new, or concerning changes in your condition. ? ?Otherwise, please follow-up with your physician for appropriate ongoing care. ? ?

## 2015-12-21 ENCOUNTER — Emergency Department (HOSPITAL_COMMUNITY): Payer: Self-pay

## 2015-12-21 ENCOUNTER — Emergency Department (HOSPITAL_COMMUNITY)
Admission: EM | Admit: 2015-12-21 | Discharge: 2015-12-21 | Disposition: A | Discharge status: Home / Self Care | Payer: Self-pay | Attending: Emergency Medicine | Admitting: Emergency Medicine

## 2015-12-21 ENCOUNTER — Encounter (HOSPITAL_COMMUNITY): Payer: Self-pay | Admitting: *Deleted

## 2015-12-21 DIAGNOSIS — R197 Diarrhea, unspecified: Secondary | ICD-10-CM | POA: Insufficient documentation

## 2015-12-21 DIAGNOSIS — F17210 Nicotine dependence, cigarettes, uncomplicated: Secondary | ICD-10-CM | POA: Insufficient documentation

## 2015-12-21 DIAGNOSIS — R05 Cough: Secondary | ICD-10-CM | POA: Insufficient documentation

## 2015-12-21 DIAGNOSIS — R6889 Other general symptoms and signs: Secondary | ICD-10-CM

## 2015-12-21 DIAGNOSIS — K219 Gastro-esophageal reflux disease without esophagitis: Secondary | ICD-10-CM | POA: Insufficient documentation

## 2015-12-21 DIAGNOSIS — R0981 Nasal congestion: Secondary | ICD-10-CM | POA: Insufficient documentation

## 2015-12-21 DIAGNOSIS — R509 Fever, unspecified: Secondary | ICD-10-CM | POA: Insufficient documentation

## 2015-12-21 DIAGNOSIS — R112 Nausea with vomiting, unspecified: Secondary | ICD-10-CM | POA: Insufficient documentation

## 2015-12-21 DIAGNOSIS — R51 Headache: Secondary | ICD-10-CM | POA: Insufficient documentation

## 2015-12-21 DIAGNOSIS — Z79899 Other long term (current) drug therapy: Secondary | ICD-10-CM | POA: Insufficient documentation

## 2015-12-21 MED ORDER — DM-GUAIFENESIN ER 30-600 MG PO TB12: 1.0000 | ORAL_TABLET | Freq: Two times a day (BID) | ORAL | Status: DC

## 2015-12-21 MED ORDER — PROMETHAZINE HCL 25 MG PO TABS: 25.0000 mg | ORAL_TABLET | Freq: Four times a day (QID) | ORAL | Status: DC | PRN

## 2015-12-21 NOTE — ED Notes (Signed)
Pt reports productive cough and fever/chills for several days. Mask on pt at triage.

## 2015-12-21 NOTE — ED Notes (Signed)
MD at bedside. 

## 2015-12-21 NOTE — Discharge Instructions (Signed)
Continue with the Zofran. Start taking the Phenergan to help control the nausea and vomiting. Take Mucinex DM for cough. Work note provided to be out of work for 2 days. Return for any new or worse symptoms.

## 2015-12-21 NOTE — ED Provider Notes (Signed)
CSN: [id number]     Arrival date & time 12/21/15  1329 History   First MD Initiated Contact with Patient 12/21/15 1725     Chief Complaint  Patient presents with  . Cough  . Fever     (Consider location/radiation/quality/duration/timing/severity/associated sxs/prior Treatment) Patient is a 50 y.o. male presenting with cough and fever. The history is provided by the patient.  Cough Associated symptoms: fever and headaches   Associated symptoms: no rash and no shortness of breath   Fever Associated symptoms: congestion, cough, diarrhea, headaches, nausea and vomiting   Associated symptoms: no confusion, no dysuria and no rash    patient seen January 4 for same illness. Patient with productive cough fever chills persistent nausea and vomiting loose bowel movements some incontinence when he coughs. No frequent diarrhea. No blood in the bowel movements or the vomit. [facility] which was negative and labs without any significant abnormality. Patient was hydrated. Patient states she's not getting any better. Description is provided the only filled the Zofran. He has been taking Zofran ODT.  Past Medical History  Diagnosis Date  . GERD (gastroesophageal reflux disease)   . Coughing 02/2015   History reviewed. No pertinent past surgical history. History reviewed. No pertinent family history. Social History  Substance Use Topics  . Smoking status: Current Every Day Smoker -- 0.50 packs/day for 30 years    Types: Cigarettes  . Smokeless tobacco: Never Used  . Alcohol Use: 0.6 oz/week    1 Cans of beer per week    Review of Systems  Constitutional: Positive for fever.  HENT: Positive for congestion.   Eyes: Negative for redness.  Respiratory: Positive for cough. Negative for shortness of breath.   Gastrointestinal: Positive for nausea, vomiting and diarrhea. Negative for abdominal pain and blood in stool.  Genitourinary: Negative for dysuria.   Musculoskeletal: Negative for back pain.  Skin: Negative for rash.  Neurological: Positive for headaches.  Hematological: Does not bruise/bleed easily.  Psychiatric/Behavioral: Negative for confusion.      Allergies  Review of patient's allergies indicates no known allergies.  Home Medications   Prior to Admission medications   Medication Sig Start Date End Date Taking? Authorizing Provider  chlorpheniramine-phenylephrine (CARDEC) 1-3.5 MG/ML LIQD Take 0.75 mLs by mouth 4 (four) times daily as needed. 12/20/15   Gerhard Munchobert Lockwood, MD  dextromethorphan-guaiFENesin [facility](MUCINEX DM) 30-600 MG 12hr tablet Take 1 tablet by mouth 2 (two) times daily. 12/21/15   Vanetta MuldersScott Fabiola Mudgett, MD  guaiFENesin-dextromethorphan (ROBITUSSIN DM) 100-10 MG/5ML syrup Take 15 mLs by mouth every 4 (four) hours as needed for cough.    Historical Provider, MD  omeprazole (PRILOSEC) 20 MG capsule Take 1 capsule (20 mg total) by mouth daily. 12/20/15   Gerhard Munchobert Lockwood, MD  ondansetron (ZOFRAN-ODT) 4 MG disintegrating tablet Take 1 tablet (4 mg total) by mouth once as needed for nausea. 12/20/15   Gerhard Munchobert Lockwood, MD  promethazine (PHENERGAN) 25 MG tablet Take 1 tablet (25 mg total) by mouth every 6 (six) hours as needed for nausea or vomiting. 12/21/15   Vanetta MuldersScott Akash Winski, MD   BP 124/83 mmHg  Pulse 75  Temp(Src) 98.8 F (37.1 C) (Oral)  Resp 20  SpO2 99% Physical Exam  Constitutional: He is oriented to person, place, and time. He appears well-developed and well-nourished. No distress.  HENT:  Head: Normocephalic and atraumatic.  Mouth/Throat: Oropharynx is clear and moist.  Eyes: Conjunctivae and EOM are normal. Pupils are equal, round, and  reactive to light.  Neck: Normal range of motion. Neck supple.  Cardiovascular: Normal rate, regular rhythm and normal heart sounds.   No murmur heard. Pulmonary/Chest: Effort normal and breath sounds normal. No respiratory distress.  Abdominal: Soft. Bowel sounds are normal. There is no  tenderness.  Musculoskeletal: Normal range of motion.  Neurological: He is alert and oriented to person, place, and time. No cranial nerve deficit. He exhibits normal muscle tone. Coordination normal.  Skin: Skin is warm. No rash noted.  Nursing note and vitals reviewed.   ED Course  Procedures (including critical care time) Labs Review Labs Reviewed - No data to display  Imaging Review Dg Chest 2 View  12/21/2015  CLINICAL DATA:  Chest pain, lower back pain secondary to cough, diarrhea and fever. EXAM: CHEST  2 VIEW COMPARISON:  None. FINDINGS: The heart size and mediastinal contours are within normal limits. Both lungs are clear. The visualized skeletal structures are unremarkable. IMPRESSION: Lungs are clear and there is no evidence of acute cardiopulmonary abnormality. Electronically Signed   By: Bary Richard M.D.   On: 12/21/2015 14:30   Dg Chest 2 View  12/20/2015  CLINICAL DATA:  Sudden onset of chest pain tonight. EXAM: CHEST  2 VIEW COMPARISON:  08/15/2015 FINDINGS: The cardiomediastinal contours are unchanged, mild tortuosity of the thoracic aorta. The lungs are clear. Pulmonary vasculature is normal. No consolidation, pleural effusion, or pneumothorax. No acute osseous abnormalities are seen. IMPRESSION: No acute pulmonary process. Electronically Signed   By: Rubye Oaks M.D.   On: 12/20/2015 01:13   I have personally reviewed and evaluated these images and lab results as part of my medical decision-making.   EKG Interpretation None      MDM   Final diagnoses:  Flu-like symptoms    Patient seen January 4 with the same complaint. Diagnosed with flulike illness. Patient still with persistent productive cough still with nausea and vomiting. Is taking the Zofran ODT. No true diarrhea but when he coughs hard he does have some incontinence. Patient's concerned about having pneumonia chest x-ray on the fourth was negative labs without any significant abnormalities. Here today  blood pressure is improved. Patient is nontoxic no acute distress.  We'll add on Phenergan and Mucinex DM to his regimen. The patient did not get the Prilosec or the chlorpheniramine/phenylephrine filled.    Vanetta Mulders, MD 12/21/15 [phone]

## 2016-01-25 ENCOUNTER — Emergency Department (INDEPENDENT_AMBULATORY_CARE_PROVIDER_SITE_OTHER): Payer: Self-pay

## 2016-01-25 ENCOUNTER — Emergency Department (INDEPENDENT_AMBULATORY_CARE_PROVIDER_SITE_OTHER)
Admission: EM | Admit: 2016-01-25 | Discharge: 2016-01-25 | Disposition: A | Discharge status: Home / Self Care | Payer: Self-pay | Source: Home / Self Care | Attending: Emergency Medicine | Admitting: Emergency Medicine

## 2016-01-25 ENCOUNTER — Encounter (HOSPITAL_COMMUNITY): Payer: Self-pay | Admitting: Emergency Medicine

## 2016-01-25 DIAGNOSIS — S99921A Unspecified injury of right foot, initial encounter: Secondary | ICD-10-CM

## 2016-01-25 NOTE — Discharge Instructions (Signed)
Toe Fracture °A toe fracture is a break in one of the toe bones (phalanges). °HOME CARE °If You Have a Cast: °· Do not stick anything inside the cast to scratch your skin. °· Check the skin around the cast every day. Tell your doctor about any concerns. Do not put lotion on the skin underneath the cast. You may put lotion on dry skin around the edges of the cast. °· Do not put pressure on any part of the cast until it is fully hardened. This may take many hours. °· Keep the cast clean and dry. °Bathing °· Do not take baths, swim, or use a hot tub until your doctor says that you can. Ask your doctor if you can take showers. You may only be allowed to take sponge baths for bathing. °· If your doctor says that bathing and showering are okay, cover the cast or bandage (dressing) with a watertight plastic bag to protect it from water. Do not let the cast or bandage get wet. °Managing Pain, Stiffness, and Swelling °· If you do not have a cast, put ice on the injured area if told by your doctor: °¨ Put ice in a plastic bag. °¨ Place a towel between your skin and the bag. °¨ Leave the ice on for 20 minutes, 2-3 times per day. °· Move your toes often to avoid stiffness and to lessen swelling. °· Raise (elevate) the injured area above the level of your heart while you are sitting or lying down. °Driving °· Do not drive or use heavy machinery while taking pain medicine. °· Do not drive while wearing a cast on a foot that you use for driving. °Activity °· Return to your normal activities as told by your doctor. Ask your doctor what activities are safe for you. °· Perform exercises daily as told by your doctor or therapist. °Safety °· Do not use your leg to support your body weight until your doctor says that you can. Use crutches or other tools to help you move around as told by your doctor. °General Instructions °· If your toe was taped to a toe that is next to it (buddy taping), follow your doctor's instructions for changing  the gauze and tape. Change it more often: °¨ If the gauze and tape get wet. If this happens, dry the space between the toes. °¨ If the gauze and tape are too tight and they cause your toe to become pale or to lose feeling (numb). °· Wear a protective shoe as told by your doctor. If you were not given one, wear sturdy shoes that support your foot. Your shoes should not pinch your toes. Your shoes should not fit tightly against your toes. °· Do not use any tobacco products, including cigarettes, chewing tobacco, or e-cigarettes. Tobacco can delay bone healing. If you need help quitting, ask your doctor. °· Take medicines only as told by your doctor. °· Keep all follow-up visits as told by your doctor. This is important. °GET HELP IF: °· You have a fever. °· Your pain medicine is not helping. °· Your toe feels cold. °· You lose feeling (have numbness) in your toe. °· You still have pain after one week of rest and treatment. °· You still have pain after your doctor has said that you can start walking again. °· You have pain or tingling in your foot, and it is not going away. °· You have loss of feeling in your foot, and it is not going away. °GET   HELP RIGHT AWAY IF: °· You have severe pain. °· You have redness or swelling (inflammation) in your toe, and it is getting worse. °· You have pain or loss of feeling in your toe, and it is getting worse. °· Your toe is blue. °  °This information is not intended to replace advice given to you by your health care provider. Make sure you discuss any questions you have with your health care provider. °  °Document Released: 05/19/2008 Document Revised: 04/17/2015 Document Reviewed: 09/27/2014 °Elsevier Interactive Patient Education ©2016 [employer]. ° °

## 2016-01-25 NOTE — ED Notes (Signed)
Reports inj to right toe; 3rd digit onset x4 days States fell and hit his toe w/elbow Sx include swelling, pain, and localized fever Steady gait... A&O x4... No acute distress.

## 2016-01-25 NOTE — ED Provider Notes (Signed)
CSN: [id number]     Arrival date & time 01/25/16  1550 History   First MD Initiated Contact with Patient 01/25/16 1711     Chief Complaint  Patient presents with  . Toe Injury   (Consider location/radiation/quality/duration/timing/severity/associated sxs/prior Treatment) HPI History obtained from patient:   LOCATION: Right third toe SEVERITY: 3 DURATION: Wednesday  CONTEXT: Neysa Bonito was carrying her niece and her knee buckled causing her to fall onto the patient's right third toe. QUALITY: MODIFYING FACTORS: Cold compresses Tylenol ASSOCIATED SYMPTOMS: Pain to walk TIMING: Constant  OCCUPATION: Taxi driver  Past Medical History  Diagnosis Date  . GERD (gastroesophageal reflux disease)   . Coughing 02/2015   History reviewed. No pertinent past surgical history. No family history on file. Social History  Substance Use Topics  . Smoking status: Current Every Day Smoker -- 0.50 packs/day for 30 years    Types: Cigarettes  . Smokeless tobacco: Never Used  . Alcohol Use: 0.6 oz/week    1 Cans of beer per week    Review of Systems ROS +'ve right third toe injury  Denies: HEADACHE, NAUSEA, ABDOMINAL PAIN, CHEST PAIN, CONGESTION, DYSURIA, SHORTNESS OF BREATH  Allergies  Review of patient's allergies indicates no known allergies.  Home Medications   Prior to Admission medications   Medication Sig Start Date End Date Taking? Authorizing Provider  chlorpheniramine-phenylephrine (CARDEC) 1-3.5 MG/ML LIQD Take 0.75 mLs by mouth 4 (four) times daily as needed. 12/20/15   Gerhard Munch, MD  dextromethorphan-guaiFENesin [facility] DM) 30-600 MG 12hr tablet Take 1 tablet by mouth 2 (two) times daily. 12/21/15   Vanetta Mulders, MD  guaiFENesin-dextromethorphan (ROBITUSSIN DM) 100-10 MG/5ML syrup Take 15 mLs by mouth every 4 (four) hours as needed for cough.    Historical Provider, MD  omeprazole (PRILOSEC) 20 MG capsule Take 1 capsule (20 mg total) by mouth daily. 12/20/15   Gerhard Munch,  MD  ondansetron (ZOFRAN-ODT) 4 MG disintegrating tablet Take 1 tablet (4 mg total) by mouth once as needed for nausea. 12/20/15   Gerhard Munch, MD  promethazine (PHENERGAN) 25 MG tablet Take 1 tablet (25 mg total) by mouth every 6 (six) hours as needed for nausea or vomiting. 12/21/15   Vanetta Mulders, MD   Meds Ordered and Administered this Visit  Medications - No data to display  BP 129/83 mmHg  Pulse 91  Temp(Src) 98.1 F (36.7 C) (Oral)  Resp 16  SpO2 97% No data found.   Physical Exam  Constitutional: He is oriented to person, place, and time. He appears well-developed and well-nourished. No distress.  HENT:  Head: Normocephalic and atraumatic.  Pulmonary/Chest: Effort normal.  Musculoskeletal: He exhibits tenderness.       Feet:  Neurological: He is alert and oriented to person, place, and time.    ED Course  Procedures (including critical care time)  Labs Review Labs Reviewed - No data to display  Imaging Review Dg Toe 3rd Right  01/25/2016  CLINICAL DATA:  Status post fall, right third toe injury. EXAM: RIGHT THIRD TOE COMPARISON:  None. FINDINGS: Osseous alignment is normal. No fracture line or displaced fracture fragment seen. Adjacent soft tissues are unremarkable. IMPRESSION: Negative. Electronically Signed   By: Bary Richard M.D.   On: 01/25/2016 17:54     Visual Acuity Review  Right Eye Distance:   Left Eye Distance:   Bilateral Distance:    Right Eye Near:   Left Eye Near:    Bilateral Near:       Results  of right toe x-ray are done with the patient.  MDM   1. Toe injury, right, initial encounter   Patient is advised to continue home symptomatic treatment.  Patient is advised that if there are new or worsening symptoms or attend the emergency department, or contact primary care provider. Instructions of care provided discharged home in stable condition. Return to work/school note provided.  THIS NOTE WAS GENERATED USING A VOICE RECOGNITION  SOFTWARE PROGRAM. ALL REASONABLE EFFORTS  WERE MADE TO PROOFREAD THIS DOCUMENT FOR ACCURACY.     Tharon Aquas, PA 01/25/16 [phone]

## 2016-01-25 NOTE — ED Notes (Signed)
Pt d/c by frank patrick, pa 

## 2016-05-04 ENCOUNTER — Ambulatory Visit (INDEPENDENT_AMBULATORY_CARE_PROVIDER_SITE_OTHER): Payer: Self-pay

## 2016-05-04 ENCOUNTER — Ambulatory Visit (HOSPITAL_COMMUNITY)
Admission: EM | Admit: 2016-05-04 | Discharge: 2016-05-04 | Disposition: A | Discharge status: Home / Self Care | Payer: Self-pay | Attending: Family Medicine | Admitting: Family Medicine

## 2016-05-04 ENCOUNTER — Encounter (HOSPITAL_COMMUNITY): Payer: Self-pay | Admitting: Emergency Medicine

## 2016-05-04 DIAGNOSIS — L97519 Non-pressure chronic ulcer of other part of right foot with unspecified severity: Secondary | ICD-10-CM

## 2016-05-04 DIAGNOSIS — L03031 Cellulitis of right toe: Secondary | ICD-10-CM

## 2016-05-04 DIAGNOSIS — K219 Gastro-esophageal reflux disease without esophagitis: Secondary | ICD-10-CM | POA: Insufficient documentation

## 2016-05-04 DIAGNOSIS — F17210 Nicotine dependence, cigarettes, uncomplicated: Secondary | ICD-10-CM | POA: Insufficient documentation

## 2016-05-04 MED ORDER — DOXYCYCLINE HYCLATE 100 MG PO CAPS: 100.0000 mg | ORAL_CAPSULE | Freq: Two times a day (BID) | ORAL | Status: DC

## 2016-05-04 MED ORDER — TRAMADOL HCL 50 MG PO TABS
50.0000 mg | ORAL_TABLET | Freq: Two times a day (BID) | ORAL | Status: DC | PRN
Start: 2016-05-04 — End: 2017-04-13

## 2016-05-04 NOTE — Discharge Instructions (Signed)
It was nice seeing you. You seem to have infection of your toe but no fracture. I have prescribed you an antibiotic and pain medication. See us soon if no improvement or if your symptoms worsens.   Cellulitis Cellulitis is an infection of the skin and the tissue under the skin. The infected area is usually red and tender. This happens most often in the arms and lower legs. HOME CARE   Take your antibiotic medicine as told. Finish the medicine even if you start to feel better.  Keep the infected arm or leg raised (elevated).  Put a warm cloth on the area up to 4 times per day.  Only take medicines as told by your doctor.  Keep all doctor visits as told. GET HELP IF:  You see red streaks on the skin coming from the infected area.  Your red area gets bigger or turns a dark color.  Your bone or joint under the infected area is painful after the skin heals.  Your infection comes back in the same area or different area.  You have a puffy (swollen) bump in the infected area.  You have new symptoms.  You have a fever. GET HELP RIGHT AWAY IF:   You feel very sleepy.  You throw up (vomit) or have watery poop (diarrhea).  You feel sick and have muscle aches and pains.   This information is not intended to replace advice given to you by your health care provider. Make sure you discuss any questions you have with your health care provider.   Document Released: 05/19/2008 Document Revised: 08/22/2015 Document Reviewed: 02/16/2012 Elsevier Interactive Patient Education [employer]2016 Elsevier Inc.

## 2016-05-04 NOTE — ED Provider Notes (Addendum)
CSN: [id number]     Arrival date & time 05/04/16  1340 History   First MD Initiated Contact with Patient 05/04/16 1548     No chief complaint on file.  (Consider location/radiation/quality/duration/timing/severity/associated sxs/prior Treatment) Patient is a 50 y.o. male presenting with toe pain. The history is provided by the patient. No language interpreter was used.  Toe Pain This is a new (4 days ago he had shopping chart roll over his right pinky toe) problem. Episode onset: 4 days ago. The problem occurs constantly. The problem has been gradually worsening. Associated symptoms comments: Since then he has been having pain of about 10/10 in severity and today he noticed some discharge and redness of his toe. He has been using peroxide on it with no improvement. No fever. He wears sock and flip flop. He does not walk bare footed.. Nothing aggravates the symptoms. Nothing relieves the symptoms. Treatments tried: Peroxide.    Past Medical History  Diagnosis Date  . GERD (gastroesophageal reflux disease)   . Coughing 02/2015   No past surgical history on file. No family history on file. Social History  Substance Use Topics  . Smoking status: Current Every Day Smoker -- 0.50 packs/day for 30 years    Types: Cigarettes  . Smokeless tobacco: Never Used  . Alcohol Use: 0.6 oz/week    1 Cans of beer per week    Review of Systems  Respiratory: Negative.   Cardiovascular: Negative.   Skin: Positive for wound.       Right pinky pain and ulcer  All other systems reviewed and are negative.   Allergies  Review of patient's allergies indicates no known allergies.  Home Medications   Prior to Admission medications   Medication Sig Start Date End Date Taking? Authorizing Provider  chlorpheniramine-phenylephrine (CARDEC) 1-3.5 MG/ML LIQD Take 0.75 mLs by mouth 4 (four) times daily as needed. 12/20/15   Gerhard Munch, MD  dextromethorphan-guaiFENesin [facility] DM) 30-600 MG 12hr tablet Take  1 tablet by mouth 2 (two) times daily. 12/21/15   Vanetta Mulders, MD  guaiFENesin-dextromethorphan (ROBITUSSIN DM) 100-10 MG/5ML syrup Take 15 mLs by mouth every 4 (four) hours as needed for cough.    Historical Provider, MD  omeprazole (PRILOSEC) 20 MG capsule Take 1 capsule (20 mg total) by mouth daily. 12/20/15   Gerhard Munch, MD  ondansetron (ZOFRAN-ODT) 4 MG disintegrating tablet Take 1 tablet (4 mg total) by mouth once as needed for nausea. 12/20/15   Gerhard Munch, MD  promethazine (PHENERGAN) 25 MG tablet Take 1 tablet (25 mg total) by mouth every 6 (six) hours as needed for nausea or vomiting. 12/21/15   Vanetta Mulders, MD   Meds Ordered and Administered this Visit  Medications - No data to display  BP 116/77 mmHg  Pulse 95  Temp(Src) 98.3 F (36.8 C) (Oral)  Resp 20  SpO2 99% No data found.   Physical Exam  Constitutional: He appears well-developed. No distress.  Cardiovascular: Normal rate and regular rhythm.   No murmur heard. Pulmonary/Chest: Effort normal and breath sounds normal. No respiratory distress. He has no wheezes.  Musculoskeletal: Normal range of motion. He exhibits no edema.       Feet:  Nursing note and vitals reviewed.   ED Course  Procedures (including critical care time)  Labs Review Labs Reviewed - No data to display  Imaging Review No results found.   Visual Acuity Review  Right Eye Distance:   Left Eye Distance:   Bilateral Distance:  Right Eye Near:   Left Eye Near:    Bilateral Near:      Dg Toe 5th Right  05/04/2016  CLINICAL DATA:  50 year old who sustained a crush injury to the right 5th toe, run over with a shopping cart approximately 4 weeks ago. Patient now has an open wound on the plantar surface of the toe with associated erythema. Initial encounter. EXAM: RIGHT FIFTH TOE 3 VIEWS COMPARISON:  None. FINDINGS: Soft tissue swelling involving the 5th toe. No evidence of acute or subacute fracture or dislocation. No evidence of  osteomyelitis. Well preserved joint spaces. Well preserved bone mineral density. IMPRESSION: No osseous abnormality. Electronically Signed   By: Hulan Saashomas  Lawrence M.D.   On: 05/04/2016 16:32      MDM  No diagnosis found. Toe ulcer, right, with unspecified severity (HCC)  Cellulitis of toe of right foot  Xray ordered given type of injury but it was negative for fracture or dislocation. Xray neg for osteomyelitis which was not suspected since this has been only for 4 days. Doxycycline A/B prescribed for outpatient management. Tramadol prn pain. Wound dressing done by nursing. Per record he is prediabetic per last A1C in 2016. A1C rechecked today. I will contact him with result. Patient instructed to return soon if no improvement or if symptoms worsens.          Doreene ElandKehinde T Eniola, MD 05/04/16 [phone]

## 2016-05-04 NOTE — ED Notes (Signed)
PT reports a shopping cart ran over his right pinky toe 4 weeks ago. PT has an open wound beneath pinky toe with surrounding redness. PT reports that he is not diabetic.

## 2016-05-05 ENCOUNTER — Telehealth: Payer: Self-pay | Admitting: Family Medicine

## 2016-05-05 LAB — HEMOGLOBIN A1C
Hgb A1c MFr Bld: 5.7 % — ABNORMAL HIGH (ref 4.8–5.6)
Mean Plasma Glucose: 117 mg/dL

## 2016-05-05 NOTE — Telephone Encounter (Signed)
I called all number listed on Epic to discuss his A1C report but I was unable to reach him or leave a message.   Note: A1C: Prediabetic. Need follow up with PCP for diet and exercise counseling.

## 2017-03-26 ENCOUNTER — Other Ambulatory Visit: Payer: Self-pay

## 2017-03-26 DIAGNOSIS — B20 Human immunodeficiency virus [HIV] disease: Secondary | ICD-10-CM

## 2017-03-26 LAB — CBC WITH DIFFERENTIAL/PLATELET
BASOS ABS: 62 {cells}/uL (ref 0–200)
BASOS PCT: 1 %
EOS ABS: 62 {cells}/uL (ref 15–500)
Eosinophils Relative: 1 %
HEMATOCRIT: 45.8 % (ref 38.5–50.0)
Hemoglobin: 15.3 g/dL (ref 13.2–17.1)
Lymphocytes Relative: 32 %
Lymphs Abs: 1984 cells/uL (ref 850–3900)
MCH: 32.8 pg (ref 27.0–33.0)
MCHC: 33.4 g/dL (ref 32.0–36.0)
MCV: 98.3 fL (ref 80.0–100.0)
MONO ABS: 496 {cells}/uL (ref 200–950)
MONOS PCT: 8 %
MPV: 10.2 fL (ref 7.5–12.5)
NEUTROS ABS: 3596 {cells}/uL (ref 1500–7800)
Neutrophils Relative %: 58 %
PLATELETS: 177 10*3/uL (ref 140–400)
RBC: 4.66 MIL/uL (ref 4.20–5.80)
RDW: 14.9 % (ref 11.0–15.0)
WBC: 6.2 10*3/uL (ref 3.8–10.8)

## 2017-03-26 LAB — LIPID PANEL
Cholesterol: 185 mg/dL (ref <200)
HDL: 49 mg/dL (ref >40)
LDL Cholesterol: 123 mg/dL — ABNORMAL HIGH (ref <100)
TRIGLYCERIDES: 66 mg/dL (ref <150)
Total CHOL/HDL Ratio: 3.8 Ratio (ref <5.0)
VLDL: 13 mg/dL (ref <30)

## 2017-03-26 LAB — COMPLETE METABOLIC PANEL WITH GFR
ALT: 32 U/L (ref 9–46)
AST: 31 U/L (ref 10–40)
Albumin: 4.2 g/dL (ref 3.6–5.1)
Alkaline Phosphatase: 61 U/L (ref 40–115)
BUN: 8 mg/dL (ref 7–25)
CHLORIDE: 106 mmol/L (ref 98–110)
CO2: 25 mmol/L (ref 20–31)
CREATININE: 1.04 mg/dL (ref 0.60–1.35)
Calcium: 9.3 mg/dL (ref 8.6–10.3)
GFR, Est African American: >89 mL/min (ref >=60)
GFR, Est Non African American: 85 mL/min (ref >=60)
Glucose, Bld: 92 mg/dL (ref 65–99)
Potassium: 4.3 mmol/L (ref 3.5–5.3)
Sodium: 140 mmol/L (ref 135–146)
Total Bilirubin: 0.5 mg/dL (ref 0.2–1.2)
Total Protein: 7 g/dL (ref 6.1–8.1)

## 2017-03-26 LAB — HEPATITIS A ANTIBODY, TOTAL: Hep A Total Ab: NONREACTIVE

## 2017-03-26 LAB — HEPATITIS B CORE ANTIBODY, TOTAL: Hep B Core Total Ab: NONREACTIVE

## 2017-03-26 LAB — HEPATITIS B SURFACE ANTIBODY,QUALITATIVE: Hep B S Ab: NEGATIVE

## 2017-03-26 LAB — HEPATITIS B SURFACE ANTIGEN: HEP B S AG: NEGATIVE

## 2017-03-26 LAB — HEPATITIS C ANTIBODY: HCV AB: NEGATIVE

## 2017-03-27 LAB — URINE CYTOLOGY ANCILLARY ONLY
Chlamydia: NEGATIVE
Neisseria Gonorrhea: NEGATIVE

## 2017-03-27 LAB — RPR

## 2017-03-27 LAB — URINALYSIS
Bilirubin Urine: NEGATIVE
Glucose, UA: NEGATIVE
Hgb urine dipstick: NEGATIVE
Ketones, ur: NEGATIVE
Leukocytes, UA: NEGATIVE
Nitrite: NEGATIVE
Protein, ur: NEGATIVE
Specific Gravity, Urine: 1.017 (ref 1.001–1.035)
pH: 5.5 (ref 5.0–8.0)

## 2017-03-27 LAB — T-HELPER CELL (CD4) - (RCID CLINIC ONLY)
CD4 % Helper T Cell: 37 % (ref 33–55)
CD4 T Cell Abs: 780 /uL (ref 400–2700)

## 2017-03-28 LAB — HIV-1 RNA,QN PCR W/REFLEX GENOTYPE
HIV-1 RNA, QN PCR: 3.6 Log cps/mL — ABNORMAL HIGH
HIV-1 RNA, QN PCR: 3990 Copies/mL — ABNORMAL HIGH

## 2017-03-30 LAB — QUANTIFERON TB GOLD ASSAY (BLOOD)
Interferon Gamma Release Assay: NEGATIVE
MITOGEN-NIL SO: 9.95 [IU]/mL
QUANTIFERON TB AG MINUS NIL: 0.02 [IU]/mL
Quantiferon Nil Value: 0.05 IU/mL

## 2017-04-02 LAB — HLA B*5701: HLA-B 5701 W/RFLX HLA-B HIGH: NEGATIVE

## 2017-04-03 LAB — HIV-1 GENOTYPR PLUS

## 2017-04-13 ENCOUNTER — Encounter: Payer: Self-pay | Admitting: Internal Medicine

## 2017-04-13 ENCOUNTER — Ambulatory Visit (INDEPENDENT_AMBULATORY_CARE_PROVIDER_SITE_OTHER): Payer: Self-pay | Admitting: Internal Medicine

## 2017-04-13 VITALS — BP 139/95 | HR 70 | Temp 98.4°F | Ht 69.0 in | Wt 174.0 lb

## 2017-04-13 DIAGNOSIS — Z716 Tobacco abuse counseling: Secondary | ICD-10-CM

## 2017-04-13 DIAGNOSIS — Z7185 Encounter for immunization safety counseling: Secondary | ICD-10-CM

## 2017-04-13 DIAGNOSIS — Z23 Encounter for immunization: Secondary | ICD-10-CM

## 2017-04-13 DIAGNOSIS — Z7189 Other specified counseling: Secondary | ICD-10-CM

## 2017-04-13 DIAGNOSIS — B20 Human immunodeficiency virus [HIV] disease: Secondary | ICD-10-CM

## 2017-04-13 MED ORDER — ELVITEG-COBIC-EMTRICIT-TENOFAF 150-150-200-10 MG PO TABS: 1.0000 | ORAL_TABLET | Freq: Every day | ORAL | 11 refills | Status: DC

## 2017-04-13 NOTE — Progress Notes (Signed)
Patient ID: Gregory Grant, male    DOB: 1966-05-20, 51 y.o.   MRN: [id number]  Reason for visit: to establish care as a new patient with HIV  HPI:   Patient was first diagnosed last month.  He was tested as part partner testing.  He was here for a visit with his partner who was recently found to be positive.   The CD4 count is 780, viral load 3,990.  There have been no associated symptoms of diarrhea.  No weight loss.  No history of syphilis, gonorrhea.  Interested in treatment.    Record reviewed in Epic, he did have multiple ED visits in 2017 and tested negative for HIV in March 2016.    Past Medical History:  Diagnosis Date  . Coughing 02/2015  . GERD (gastroesophageal reflux disease)     Prior to Admission medications   Medication Sig Start Date End Date Taking? Authorizing Provider  elvitegravir-cobicistat-emtricitabine-tenofovir (GENVOYA) 150-150-200-10 MG TABS tablet Take 1 tablet by mouth daily. 04/13/17   Gardiner Barefoot, MD    No Known Allergies  Social History  Substance Use Topics  . Smoking status: Current Every Day Smoker    Packs/day: 0.50    Years: 30.00    Types: Cigarettes  . Smokeless tobacco: Never Used  . Alcohol use 0.6 oz/week    1 Cans of beer per week    FMH: no cardiac disease, no kidney disease  Review of Systems Constitutional: negative for fatigue, malaise and anorexia Respiratory: negative for cough or dyspnea on exertion Gastrointestinal: negative for nausea and diarrhea Integument/breast: negative for rash All other systems reviewed and are negative    CONSTITUTIONAL:in no apparent distress  Vitals:   04/13/17 0946  BP: (!) 139/95  Pulse: 70  Temp: 98.4 F (36.9 C)   EYES: anicteric HENT: no thrush CARD:Cor RRR RESP:CTA B; normal respiratory effort SW:FUXNA sounds are normal, liver is not enlarged, spleen is not enlarged, soft, nt MS:no pedal edema noted SKIN:no rashes NEURO: non-focal  No results found for: HIV1RNAQUANT No  components found for: HIV1GENOTYPRPLUS No components found for: THELPERCELL  Assessment: new patient here with HIV with intake immune system.  Discussed with patient treatment options and side effects, benefits of treatment, long term outcomes.  I discussed the severity of untreated HIV including higher cancer risk, opportunistic infections, renal failure.  Also discussed needing to use condoms, partner disclosure, necessary vaccines, blood monitoring.  All questions answered.  Is approved via [employer].   Counseled on vaccines    Plan: 1) start Genvoya 2) labs in 4 weeks 3) follow up with me in 5 weeks 4) hepatitis A, B and menveo today 5) pneumovax next visit 6) counseled on smoking cessation

## 2017-04-16 ENCOUNTER — Encounter: Payer: Self-pay | Admitting: *Deleted

## 2017-05-05 ENCOUNTER — Other Ambulatory Visit: Payer: Self-pay

## 2017-05-05 DIAGNOSIS — B20 Human immunodeficiency virus [HIV] disease: Secondary | ICD-10-CM

## 2017-05-05 LAB — COMPLETE METABOLIC PANEL WITH GFR
ALBUMIN: 4.1 g/dL (ref 3.6–5.1)
ALK PHOS: 66 U/L (ref 40–115)
ALT: 18 U/L (ref 9–46)
AST: 21 U/L (ref 10–40)
BILIRUBIN TOTAL: 0.4 mg/dL (ref 0.2–1.2)
BUN: 12 mg/dL (ref 7–25)
CO2: 24 mmol/L (ref 20–31)
CREATININE: 1.1 mg/dL (ref 0.60–1.35)
Calcium: 9.3 mg/dL (ref 8.6–10.3)
Chloride: 103 mmol/L (ref 98–110)
GFR, Est African American: >89 mL/min (ref >=60)
GFR, Est Non African American: 79 mL/min (ref >=60)
GLUCOSE: 101 mg/dL — AB (ref 65–99)
POTASSIUM: 4 mmol/L (ref 3.5–5.3)
SODIUM: 138 mmol/L (ref 135–146)
TOTAL PROTEIN: 6.5 g/dL (ref 6.1–8.1)

## 2017-05-05 LAB — CBC WITH DIFFERENTIAL/PLATELET
BASOS ABS: 0 {cells}/uL (ref 0–200)
Basophils Relative: 0 %
EOS PCT: 1 %
Eosinophils Absolute: 85 cells/uL (ref 15–500)
HCT: 45.3 % (ref 38.5–50.0)
HEMOGLOBIN: 14.9 g/dL (ref 13.2–17.1)
LYMPHS PCT: 33 %
Lymphs Abs: 2805 cells/uL (ref 850–3900)
MCH: 32.3 pg (ref 27.0–33.0)
MCHC: 32.9 g/dL (ref 32.0–36.0)
MCV: 98.1 fL (ref 80.0–100.0)
MONOS PCT: 8 %
MPV: 10.4 fL (ref 7.5–12.5)
Monocytes Absolute: 680 cells/uL (ref 200–950)
NEUTROS PCT: 58 %
Neutro Abs: 4930 cells/uL (ref 1500–7800)
Platelets: 197 10*3/uL (ref 140–400)
RBC: 4.62 MIL/uL (ref 4.20–5.80)
RDW: 14.6 % (ref 11.0–15.0)
WBC: 8.5 10*3/uL (ref 3.8–10.8)

## 2017-05-06 LAB — T-HELPER CELL (CD4) - (RCID CLINIC ONLY)
CD4 T CELL HELPER: 36 % (ref 33–55)
CD4 T Cell Abs: 1110 /uL (ref 400–2700)

## 2017-05-09 LAB — HIV-1 RNA QUANT-NO REFLEX-BLD
HIV 1 RNA Quant: 23 copies/mL — ABNORMAL HIGH
HIV-1 RNA Quant, Log: 1.36 Log copies/mL — ABNORMAL HIGH

## 2017-05-12 ENCOUNTER — Encounter: Payer: Self-pay | Admitting: Internal Medicine

## 2017-05-12 ENCOUNTER — Ambulatory Visit (INDEPENDENT_AMBULATORY_CARE_PROVIDER_SITE_OTHER): Payer: Self-pay | Admitting: Internal Medicine

## 2017-05-12 VITALS — BP 144/98 | HR 91 | Temp 98.6°F | Ht 68.0 in | Wt 165.0 lb

## 2017-05-12 DIAGNOSIS — Z7189 Other specified counseling: Secondary | ICD-10-CM

## 2017-05-12 DIAGNOSIS — Z23 Encounter for immunization: Secondary | ICD-10-CM

## 2017-05-12 DIAGNOSIS — Z7185 Encounter for immunization safety counseling: Secondary | ICD-10-CM

## 2017-05-12 DIAGNOSIS — B20 Human immunodeficiency virus [HIV] disease: Secondary | ICD-10-CM

## 2017-05-12 NOTE — Assessment & Plan Note (Signed)
Hep B #2 and menveo #2 today.

## 2017-05-12 NOTE — Assessment & Plan Note (Signed)
Doing well.  Suppressed and good tolerance.  Can rtc 3-4 months.

## 2017-05-12 NOTE — Progress Notes (Signed)
   Subjective:    Patient ID: Gregory Grant, male    DOB: February 14, 1966, 51 y.o.   MRN: [id number]  HPI Here for follow up of HIV> New patient recently and started on Genvoya with no missed doses.  Viral load down to just 23 copies, CD4 1100.  No associated n/v/d.  No rash.  Getting hepatitis A, B and Menveo vaccines.    Review of Systems  Constitutional: Negative for fatigue.  Gastrointestinal: Negative for diarrhea and nausea.  Neurological: Negative for dizziness.       Objective:   Physical Exam  Constitutional: He appears well-developed and well-nourished.  Eyes: No scleral icterus.  Cardiovascular: Normal rate, regular rhythm and normal heart sounds.   No murmur heard. Pulmonary/Chest: Effort normal and breath sounds normal. No respiratory distress.  Lymphadenopathy:    He has no cervical adenopathy.  Skin: No rash noted.          Assessment & Plan:

## 2017-05-12 NOTE — Addendum Note (Signed)
Addended by: Linnell FullingBRANNON, Jeramine Delis N on: 05/12/2017 11:32 AM   Modules accepted: Orders

## 2017-07-21 ENCOUNTER — Other Ambulatory Visit: Payer: Self-pay

## 2017-07-22 ENCOUNTER — Other Ambulatory Visit: Payer: Self-pay

## 2017-07-22 ENCOUNTER — Ambulatory Visit: Payer: Self-pay

## 2017-07-27 ENCOUNTER — Other Ambulatory Visit: Payer: Self-pay

## 2017-07-27 DIAGNOSIS — B20 Human immunodeficiency virus [HIV] disease: Secondary | ICD-10-CM

## 2017-07-28 LAB — T-HELPER CELL (CD4) - (RCID CLINIC ONLY)
CD4 T CELL ABS: 760 /uL (ref 400–2700)
CD4 T CELL HELPER: 33 % (ref 33–55)

## 2017-07-30 LAB — HIV-1 RNA QUANT-NO REFLEX-BLD
HIV 1 RNA QUANT: NOT DETECTED {copies}/mL
HIV-1 RNA QUANT, LOG: NOT DETECTED {Log_copies}/mL

## 2017-08-04 ENCOUNTER — Ambulatory Visit (INDEPENDENT_AMBULATORY_CARE_PROVIDER_SITE_OTHER): Payer: Self-pay | Admitting: Internal Medicine

## 2017-08-04 ENCOUNTER — Encounter: Payer: Self-pay | Admitting: Internal Medicine

## 2017-08-04 VITALS — BP 130/85 | Temp 98.3°F | Wt 161.0 lb

## 2017-08-04 DIAGNOSIS — B20 Human immunodeficiency virus [HIV] disease: Secondary | ICD-10-CM

## 2017-08-04 DIAGNOSIS — Z716 Tobacco abuse counseling: Secondary | ICD-10-CM

## 2017-08-04 MED ORDER — BICTEGRAVIR-EMTRICITAB-TENOFOV 50-200-25 MG PO TABS: 1.0000 | ORAL_TABLET | Freq: Every day | ORAL | 11 refills | Status: DC

## 2017-08-04 NOTE — Progress Notes (Signed)
   Subjective:    Patient ID: Gregory Grant, male    DOB: 26-Mar-1966, 51 y.o.   MRN: [id number]  HPI Here for follow up of HIV Has been on Genvoya and denies any missed doses.  No associated n/v/d.  No new complaints.     Review of Systems  Constitutional: Negative for fatigue.  Gastrointestinal: Negative for diarrhea.  Skin: Negative for rash.  Neurological: Negative for dizziness.       Objective:   Physical Exam  Constitutional: He appears well-developed and well-nourished. No distress.  Eyes: No scleral icterus.  Cardiovascular: Normal rate, regular rhythm and normal heart sounds.   No murmur heard. Pulmonary/Chest: Effort normal and breath sounds normal. No respiratory distress.  Lymphadenopathy:    He has no cervical adenopathy.  Skin: No rash noted.          Assessment & Plan:

## 2017-08-04 NOTE — Addendum Note (Signed)
Addended by: Gardiner Barefoot on: 08/04/2017 11:37 AM   Modules accepted: Level of Service

## 2017-08-04 NOTE — Assessment & Plan Note (Signed)
Doing well. I will change him to [facility] with next refill.  rtc 4 months. Hepatitis A and B next visit

## 2017-08-04 NOTE — Assessment & Plan Note (Signed)
Discussed cessation, precontemplative 

## 2017-08-10 ENCOUNTER — Encounter: Payer: Self-pay | Admitting: Internal Medicine

## 2017-12-10 ENCOUNTER — Other Ambulatory Visit: Payer: Self-pay

## 2017-12-10 DIAGNOSIS — B20 Human immunodeficiency virus [HIV] disease: Secondary | ICD-10-CM

## 2017-12-10 LAB — COMPLETE METABOLIC PANEL WITH GFR
AG RATIO: 1.7 (calc) (ref 1.0–2.5)
ALKALINE PHOSPHATASE (APISO): 51 U/L (ref 40–115)
ALT: 19 U/L (ref 9–46)
AST: 18 U/L (ref 10–40)
Albumin: 3.9 g/dL (ref 3.6–5.1)
BUN: 16 mg/dL (ref 7–25)
CALCIUM: 9.5 mg/dL (ref 8.6–10.3)
CO2: 27 mmol/L (ref 20–32)
Chloride: 106 mmol/L (ref 98–110)
Creat: 1.05 mg/dL (ref 0.60–1.35)
GFR, EST NON AFRICAN AMERICAN: 83 mL/min/{1.73_m2} (ref >=60)
GFR, Est African American: 96 mL/min/{1.73_m2} (ref >=60)
GLOBULIN: 2.3 g/dL (ref 1.9–3.7)
Glucose, Bld: 106 mg/dL — ABNORMAL HIGH (ref 65–99)
POTASSIUM: 4.3 mmol/L (ref 3.5–5.3)
SODIUM: 139 mmol/L (ref 135–146)
Total Bilirubin: 0.4 mg/dL (ref 0.2–1.2)
Total Protein: 6.2 g/dL (ref 6.1–8.1)

## 2017-12-11 LAB — T-HELPER CELL (CD4) - (RCID CLINIC ONLY)
CD4 T CELL ABS: 890 /uL (ref 400–2700)
CD4 T CELL HELPER: 39 % (ref 33–55)

## 2017-12-12 LAB — HIV-1 RNA QUANT-NO REFLEX-BLD
HIV 1 RNA Quant: <20 copies/mL
HIV-1 RNA Quant, Log: <1.3 Log copies/mL

## 2017-12-22 ENCOUNTER — Encounter: Payer: Self-pay | Admitting: Internal Medicine

## 2017-12-22 ENCOUNTER — Ambulatory Visit (INDEPENDENT_AMBULATORY_CARE_PROVIDER_SITE_OTHER): Payer: Self-pay | Admitting: Internal Medicine

## 2017-12-22 DIAGNOSIS — Z7189 Other specified counseling: Secondary | ICD-10-CM

## 2017-12-22 DIAGNOSIS — Z716 Tobacco abuse counseling: Secondary | ICD-10-CM

## 2017-12-22 DIAGNOSIS — B20 Human immunodeficiency virus [HIV] disease: Secondary | ICD-10-CM

## 2017-12-22 DIAGNOSIS — Z79899 Other long term (current) drug therapy: Secondary | ICD-10-CM

## 2017-12-22 DIAGNOSIS — Z0000 Encounter for general adult medical examination without abnormal findings: Secondary | ICD-10-CM | POA: Insufficient documentation

## 2017-12-22 DIAGNOSIS — Z7185 Encounter for immunization safety counseling: Secondary | ICD-10-CM

## 2017-12-22 DIAGNOSIS — Z23 Encounter for immunization: Secondary | ICD-10-CM

## 2017-12-22 DIAGNOSIS — Z113 Encounter for screening for infections with a predominantly sexual mode of transmission: Secondary | ICD-10-CM

## 2017-12-22 NOTE — Assessment & Plan Note (Signed)
Counseled and precontemplative

## 2017-12-22 NOTE — Assessment & Plan Note (Signed)
Doing well on Biktarvy.  rtc 6 months Will get him in for a PCP

## 2017-12-22 NOTE — Assessment & Plan Note (Addendum)
Hepatitis A, B and flu shot today Pneumovax next visit

## 2017-12-22 NOTE — Progress Notes (Signed)
   Subjective:    Patient ID: Gregory Grant, male    DOB: 1966/12/05, 52 y.o.   MRN: [id number]  HPI Here for follow up of HIV Now on Biktarvy and doing well.  No missed doses. CD4 890, viral load < 20.  Creat wnl.  No associated n/v/d. No rashes.     Review of Systems  Constitutional: Negative for fatigue.  Gastrointestinal: Negative for diarrhea.  Skin: Negative for rash.       Objective:   Physical Exam  Constitutional: He appears well-developed and well-nourished. No distress.  HENT:  Mouth/Throat: No oropharyngeal exudate.  Eyes: No scleral icterus.  Cardiovascular: Normal rate, regular rhythm and normal heart sounds.  No murmur heard. Pulmonary/Chest: Effort normal and breath sounds normal. No respiratory distress.  Lymphadenopathy:    He has no cervical adenopathy.  Skin: No rash noted.   SH: + tobacco       Assessment & Plan:

## 2017-12-29 NOTE — Addendum Note (Signed)
Addended by: Andree CossHOWELL, Vennie Salsbury M on: 12/29/2017 02:27 PM   Modules accepted: Orders

## 2018-01-15 ENCOUNTER — Encounter (HOSPITAL_COMMUNITY): Payer: Self-pay | Admitting: Family Medicine

## 2018-01-15 ENCOUNTER — Ambulatory Visit (HOSPITAL_COMMUNITY)
Admission: EM | Admit: 2018-01-15 | Discharge: 2018-01-15 | Disposition: A | Discharge status: Home / Self Care | Payer: Self-pay | Attending: Internal Medicine | Admitting: Internal Medicine

## 2018-01-15 DIAGNOSIS — M542 Cervicalgia: Secondary | ICD-10-CM

## 2018-01-15 MED ORDER — PREDNISONE 20 MG PO TABS: 40.0000 mg | ORAL_TABLET | Freq: Every day | ORAL | 0 refills | Status: AC

## 2018-01-15 MED ORDER — KETOROLAC TROMETHAMINE 60 MG/2ML IM SOLN
INTRAMUSCULAR | Status: AC
  Filled 2018-01-15: qty 2

## 2018-01-15 MED ORDER — CYCLOBENZAPRINE HCL 10 MG PO TABS: 10.0000 mg | ORAL_TABLET | Freq: Three times a day (TID) | ORAL | 0 refills | Status: AC

## 2018-01-15 MED ORDER — CYCLOBENZAPRINE HCL 10 MG PO TABS: 10.0000 mg | ORAL_TABLET | Freq: Every day | ORAL | 0 refills | Status: DC

## 2018-01-15 MED ORDER — KETOROLAC TROMETHAMINE 60 MG/2ML IM SOLN: 60.0000 mg | Freq: Once | INTRAMUSCULAR | Status: DC

## 2018-01-15 NOTE — Discharge Instructions (Signed)
For your neck pain please continue Tylenol 500 to 1000 mg every 4-6 hours as needed.  Please avoid ibuprofen due to your HIV medicine.  Please take prednisone 40 mg for the next 3 days.  You may use Flexeril as needed for this pain, preferably only at night when you will be at home. It is a muscle relaxer- This may cause some sedation, avoid driving after use.  If causing too much sedation in the morning please take half of a pill.  I expect her neck pain to slowly improve over the next 1-2 weeks.  Avoid any heavy lifting.  Pain not improving or worsening please return.

## 2018-01-15 NOTE — ED Triage Notes (Signed)
Pt here for 2 days for neck pain. sts hurts when he goes from lying to sitting due to the pressure. No injury. 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

## 2018-01-15 NOTE — ED Provider Notes (Signed)
[facility]    CSN: [id number] Arrival date & time: 01/15/18  1230     History   Chief Complaint Chief Complaint  Patient presents with  . Neck Pain    HPI Gregory Grant is a 52 y.o. male history of HIV presenting with neck pain.  Neck pain began 4 days ago on Tuesday and has persisted since.  He has been taken Tylenol.  He denies any specific injury.  He does state he helped move his mom on Saturday previously but states that the furniture was not heavy and he had help.  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  States that the pain starts in the middle of his back and goes to his shoulders.  Denies any numbness or tingling, weakness.  Denies any shortness of breath or chest pain.   HPI  Past Medical History:  Diagnosis Date  . Coughing 02/2015  . GERD (gastroesophageal reflux disease)     Patient Active Problem List   Diagnosis Date Noted  . Screening examination for venereal disease 12/22/2017  . Encounter for long-term (current) use of high-risk medication 12/22/2017  . Human immunodeficiency virus (HIV) disease (HCC) 04/13/2017  . Tobacco abuse counseling 04/13/2017  . Vaccine counseling 04/13/2017  . Prediabetes 03/16/2015  . 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 No pertinent surgical history.     Home Medications    Prior to Admission medications   Medication Sig Start Date End Date Taking? Authorizing Provider  bictegravir-emtricitabine-tenofovir AF (BIKTARVY) 50-200-25 MG TABS tablet Take 1 tablet by mouth daily. 08/04/17   Comer, Belia Hemanobert W, MD  cyclobenzaprine (FLEXERIL) 10 MG tablet Take 1 tablet (10 mg total) by mouth 3 (three) times daily for 10 days. 01/15/18 01/25/18  Vinson Tietze C, PA-C  predniSONE (DELTASONE) 20 MG tablet Take 2 tablets (40 mg total) by mouth daily for 3 days. 01/15/18 01/18/18  Elis Rawlinson, Junius CreamerHallie C, PA-C    Family History History reviewed. No  pertinent family history.  Social History Social History   Tobacco Use  . Smoking status: Current Every Day Smoker    Packs/day: 0.50    Years: 30.00    Pack years: 15.00    Types: Cigarettes  . Smokeless tobacco: Never Used  Substance Use Topics  . Alcohol use: Yes    Alcohol/week: 0.6 oz    Types: 1 Cans of beer per week  . Drug use: No     Allergies   Patient has no known allergies.   Review of Systems Review of Systems  Constitutional: Negative for fatigue and fever.  Respiratory: Negative for shortness of breath.   Cardiovascular: Negative for chest pain.  Gastrointestinal: Negative for nausea and vomiting.  Musculoskeletal: Positive for back pain, myalgias and neck pain. Negative for gait problem.  Neurological: Negative for dizziness, syncope, weakness, light-headedness, numbness and headaches.     Physical Exam Triage Vital Signs ED Triage Vitals  Enc Vitals Group     BP 01/15/18 1304 132/88     Pulse Rate 01/15/18 1304 99     Resp 01/15/18 1304 18     Temp 01/15/18 1304 98.2 F (36.8 C)     Temp src --      SpO2 01/15/18 1304 97 %     Weight --      Height --      Head Circumference --      Peak Flow --  Pain Score 01/15/18 1302 9     Pain Loc --      Pain Edu? --      Excl. in GC? --    No data found.  Updated Vital Signs BP 132/88   Pulse 99   Temp 98.2 F (36.8 C)   Resp 18   SpO2 97%   Physical Exam  Constitutional: He appears well-developed and well-nourished.  HENT:  Head: Normocephalic and atraumatic.  Eyes: Conjunctivae are normal.  Neck: Normal range of motion. Neck supple.  Patient with full active range of motion of neck.  Pain elicited with rightward rotation leftward rotation and backward flexion.  Nontender to cervical spine.  Mild tenderness to top of thoracic spine, and upper thoracic musculature toward shoulders.  Cardiovascular: Normal rate and regular rhythm.  No murmur heard. Pulmonary/Chest: Effort normal and  breath sounds normal. No respiratory distress.  Clear To auscultation bilaterally  Abdominal: Soft. There is no tenderness.  Musculoskeletal: He exhibits no edema.  Full active range of motion of shoulders bilaterally, strength 5 out of 5 bilaterally.  Neurological: He is alert.  Skin: Skin is warm and dry.  Psychiatric: He has a normal mood and affect.  Nursing note and vitals reviewed.    UC Treatments / Results  Labs (all labs ordered are listed, but only abnormal results are displayed) Labs Reviewed - No data to display  EKG  EKG Interpretation None       Radiology No results found.  Procedures Procedures (including critical care time)  Medications Ordered in UC Medications - No data to display   Initial Impression / Assessment and Plan / UC Course  I have reviewed the triage vital signs and the nursing notes.  Pertinent labs & imaging results that were available during my care of the patient were reviewed by me and considered in my medical decision making (see chart for details).     Likely cervical strain/thoracic muscular strain.  Initially considered Toradol and NSAIDs for pain relief, but patient is on HIV medication and this combo increases risk of nephrotoxicity.  We will continue Tylenol.  Prednisone for 3 days.  Flexeril as needed.  Advised Flexeril may cause sedation, avoid driving.  May cut in half if causes too much sedation.  No heavy lifting.  Expect symptoms to slowly improve over the next 1-2 weeks. Discussed strict return precautions. Patient verbalized understanding and is agreeable with plan.   Final Clinical Impressions(s) / UC Diagnoses   Final diagnoses:  Neck pain    ED Discharge Orders        Ordered    cyclobenzaprine (FLEXERIL) 10 MG tablet  Daily at bedtime,   Status:  Discontinued     01/15/18 1415    predniSONE (DELTASONE) 20 MG tablet  Daily     01/15/18 1415    cyclobenzaprine (FLEXERIL) 10 MG tablet  3 times daily      01/15/18 1421       Controlled Substance Prescriptions [place] Controlled Substance Registry consulted? Not Applicable   Lew Dawes, [place] 01/15/18 1430

## 2018-02-12 ENCOUNTER — Encounter: Payer: Self-pay | Admitting: Internal Medicine

## 2018-03-18 ENCOUNTER — Telehealth: Payer: Self-pay | Admitting: *Deleted

## 2018-03-18 NOTE — Telephone Encounter (Signed)
Called patient with appointment information. He asked me to text it to him, as he was currently at work. I offered to text him the MyChart sign up so he could access all of his appointment information. He agreed, supplied the phone number for the MyChart text. RN repeated and verified before sending invitation. Andree CossMichelle M Ramie Erman, RN   Catheryn BaconSolomon, Doris A  Sira Adsit M, RN        [place]   Appointment 03/30/18 at 2:15 PM. Please inform patient.   Thanks,   Doris   Previous Messages    ----- Message -----  From: Shon HoughBoone, Gregory F  Sent: 03/15/2018  9:50 AM  To: Jackelyn Polingoris A Solomon

## 2018-03-30 ENCOUNTER — Encounter: Payer: Self-pay | Admitting: Internal Medicine

## 2018-03-30 ENCOUNTER — Ambulatory Visit (INDEPENDENT_AMBULATORY_CARE_PROVIDER_SITE_OTHER): Payer: Self-pay | Admitting: Internal Medicine

## 2018-03-30 VITALS — BP 126/89 | HR 91 | Temp 98.6°F | Wt 163.9 lb

## 2018-03-30 DIAGNOSIS — E782 Mixed hyperlipidemia: Secondary | ICD-10-CM | POA: Insufficient documentation

## 2018-03-30 DIAGNOSIS — F1099 Alcohol use, unspecified with unspecified alcohol-induced disorder: Secondary | ICD-10-CM

## 2018-03-30 DIAGNOSIS — Z789 Other specified health status: Secondary | ICD-10-CM

## 2018-03-30 DIAGNOSIS — E785 Hyperlipidemia, unspecified: Secondary | ICD-10-CM

## 2018-03-30 DIAGNOSIS — Z716 Tobacco abuse counseling: Secondary | ICD-10-CM

## 2018-03-30 DIAGNOSIS — Z7289 Other problems related to lifestyle: Secondary | ICD-10-CM | POA: Insufficient documentation

## 2018-03-30 DIAGNOSIS — M25551 Pain in right hip: Secondary | ICD-10-CM | POA: Insufficient documentation

## 2018-03-30 DIAGNOSIS — Z79899 Other long term (current) drug therapy: Secondary | ICD-10-CM

## 2018-03-30 DIAGNOSIS — F17210 Nicotine dependence, cigarettes, uncomplicated: Secondary | ICD-10-CM

## 2018-03-30 DIAGNOSIS — Z0000 Encounter for general adult medical examination without abnormal findings: Secondary | ICD-10-CM

## 2018-03-30 DIAGNOSIS — Z21 Asymptomatic human immunodeficiency virus [HIV] infection status: Secondary | ICD-10-CM

## 2018-03-30 DIAGNOSIS — F1290 Cannabis use, unspecified, uncomplicated: Secondary | ICD-10-CM

## 2018-03-30 DIAGNOSIS — R7303 Prediabetes: Secondary | ICD-10-CM

## 2018-03-30 DIAGNOSIS — F1090 Alcohol use, unspecified, uncomplicated: Secondary | ICD-10-CM | POA: Insufficient documentation

## 2018-03-30 LAB — POCT GLYCOSYLATED HEMOGLOBIN (HGB A1C): HEMOGLOBIN A1C: 5.4

## 2018-03-30 LAB — GLUCOSE, CAPILLARY: GLUCOSE-CAPILLARY: 86 mg/dL (ref 65–99)

## 2018-03-30 MED ORDER — ATORVASTATIN CALCIUM 10 MG PO TABS: 10.0000 mg | ORAL_TABLET | Freq: Every day | ORAL | 3 refills | Status: DC

## 2018-03-30 MED ORDER — SIMVASTATIN 20 MG PO TABS: 20.0000 mg | ORAL_TABLET | Freq: Every day | ORAL | 3 refills | Status: DC

## 2018-03-30 NOTE — Assessment & Plan Note (Signed)
Assessment/Plan Significant daily alcohol intake without hx of withdrawal seizures. Patient not interested in quitting at this time. Counseled

## 2018-03-30 NOTE — Patient Instructions (Addendum)
FOLLOW-UP INSTRUCTIONS When: 1 year For: pre-diabetes management and health maintenance What to bring: medications   [name],  It was a pleasure to meet you today.  For your hip pain, we are getting an x-ray of your hip. I will let you know the results.  We are checking some bloodwork today - I will let you know the results when I get them back.  We are starting a medicine for your cholesterol. Please take atorvastatin 10mg  once a day at night - You can pick this up for $4 at the [facility]. This is located at [address], [place], [place] [id number].  We will work on getting you set up for colon cancer screening when you turn 52 years old - either with a colonoscopy or with the stool cards.  We will see you back in 12 months.

## 2018-03-30 NOTE — Assessment & Plan Note (Addendum)
Assessment A1c 5.7 in 04/2016. No polyuria, polydipsia, or blurry vision. He does have HIV on Biktarvy. Will repeat A1c given increased risk of metabolic syndrome with HIV.  Plan - Check A1c - F/u in 1 year  ADDENDUM: A1c normal at 5.4

## 2018-03-30 NOTE — Assessment & Plan Note (Signed)
Assessment 4 months of right hip pain that occasionally wakes him up from sleep and slows him down at work. He is quite physically active at his job and "deals with the pain". He has never had this pain before. Endorses some associated weakness and numbness of RLE. No imaging in our system. HIV does increase risk of avascular necrosis, so will obtain further imaging. Less likely osteoarthritis.  Plan - Right hip x-ray ordered

## 2018-03-30 NOTE — Assessment & Plan Note (Signed)
Assessment Lipid panel from 03/2017 show Tchol 185, LDL 123, TG 66, HDL 49. ASCVD 10-year risk is 8.2%. Patient agreeable to start statin for primary prevention. Family history positive for heart attacks - patient is not sure which family members had heart attacks but states they were in their age 2780s.  Plan - Start atorvastatin 10mg  QHS - [facility] to [facility] for $4 - Repeat lipid panel ordered for next ID clinic visit

## 2018-03-30 NOTE — Assessment & Plan Note (Signed)
Assessment Continues to smoke 0.5ppd, not interested in quitting due to concern for weight gain and work stressors. Discussed risks of continued tobacco use, including lung cancer and heart disease. Patient is not concerned about these, stating that he has to die from something and that he is healthy right now and is not going to get sick.  Plan - Not interested in quitting currently, continue to discuss smoking cessation at future visits

## 2018-03-30 NOTE — Progress Notes (Signed)
   CC: pre-diabetes  HPI:  [name] is a 52 y.o. male with PMH of HIV, pre-diabetes,Gregory Grant and tobacco use disorder who presents as a new patient [facility] management of pre-diabetes.  He is Biktarvy with good control of his HIV. Most recent labs in 11/2017 showed an undetectable viral load and CD4 count of 890.  He does report 4 months of right hip pain that is a 10 out of 10 in severity and sharp, occasionally shoots down his leg. He endorses occasional weakness in his right lower extremity and numbness. He states that the pain is worse at night, and occasionally wakes him up from sleep. 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 He cannot sleep on his right side for too long because of the pain. He denies back pain. He states that he is still able to complete his daily activities however it does slow him down at work. He denies having tried any medications for the pain. He has never had this pain before.  SHx: He smokes 0.5ppd. He is not interested in quitting. He states that he did quit "cold [place]" from 2000-2006 however he gained a lot of weight and thus is not interested in quitting again. He also states his job is stressful and he needs the cigarettes to help him cope with the stress. He drinks 2 beers Mon-Thurs and 1/2 bottle of liquor Fri-Sun. He denies requiring a drink in the morning. He has withdrawn before, during which he "got the shakes but I didn't let it bother me". He denies history of withdrawal seizures. Is not interested in quitting alcohol at this time. Also smokes marijuana on the weekends - "I can't smoke when I'm at work". He denies other illicit drugs.  Past Medical History:  Diagnosis Date  . Coughing 02/2015  . GERD (gastroesophageal reflux disease)    FHx: - No hx of colon cancer - Heart attack in age 6580s (pt not sure which family members)  No past surgical history on  file.  Allergies: NKDA  Review of Systems:   Constitutional: Negative for fever or weight loss.  HEENT: Negative for blurred vision and sore throat. Respiratory: Negative for shortness of breath.   Cardiovascular: Negative for chest pain, palpitations, and leg swelling. Gastrointestinal: Negative for abdominal pain,nausea and vomiting.   Genitourinary: Negative for dysuria.  Musculoskeletal: Positive for right hip pain and intermittent numbness of RLE. Neurological: Negative for dizziness, focal weakness, weakness and headaches. Endo: Negative for polyuria, polydipsia.  Physical Exam:  Vitals:   03/30/18 1421  BP: 126/89  Pulse: 91  Temp: 98.6 F (37 C)  TempSrc: Oral  SpO2: 99%  Weight: 163 lb 14.4 oz (74.3 kg)   GEN: Sitting in chair in NAD, cigarette tucked behind left ear and bluetooth speaker in right ear HENT: MMM, no erythema of posterior pharynx EYES: PERRL. Conjunctival injection bilaterally CV: NR & RR, no m/r/g PULM: CTAB, no wheezes or rales ABD: Soft, NT, ND, +BS MSK: No LE edema. No TTP of right hip. SKIN: Warm, dry NEURO: CN II-XII grossly intact. Moving all extremities PSYCH: Calm, pleasant.  Assessment & Plan:   See Encounters Tab for problem based charting.  Patient discussed with Dr. Rogelia BogaButcher

## 2018-03-30 NOTE — Assessment & Plan Note (Addendum)
TDAP Patient not interested in getting TDAP at this clinic. States he prefers to get this done at the ID clinic because he knows the nurses there.  Colon cancer screening Discussed options for colon cancer screening - no family history of colon cancer. Patient interested in colonoscopy for colon cancer screening - he will turn 52 in 2 months. He does not have insurance currently but is possibly getting insurance. Patient will call our clinic back with this information. Could possibly do FIT cards in the meantime before colonoscopy is able to be set up.

## 2018-03-31 ENCOUNTER — Encounter: Payer: Self-pay | Admitting: Internal Medicine

## 2018-04-01 NOTE — Progress Notes (Signed)
Internal Medicine Clinic Attending  Case discussed with Dr. Renaldo ReelHuang at the time of the visit.  We reviewed the resident's history and exam and pertinent patient test results.  I agree with the assessment, diagnosis, and plan of care documented in the resident's note. Pt did not complete Hip Xray - worried about AVN. RF are ETOH and HIV.

## 2018-06-07 ENCOUNTER — Other Ambulatory Visit: Payer: Self-pay

## 2018-06-09 ENCOUNTER — Other Ambulatory Visit: Payer: Self-pay

## 2018-06-09 DIAGNOSIS — B20 Human immunodeficiency virus [HIV] disease: Secondary | ICD-10-CM

## 2018-06-09 DIAGNOSIS — Z113 Encounter for screening for infections with a predominantly sexual mode of transmission: Secondary | ICD-10-CM

## 2018-06-09 DIAGNOSIS — Z79899 Other long term (current) drug therapy: Secondary | ICD-10-CM

## 2018-06-10 LAB — COMPLETE METABOLIC PANEL WITH GFR
AG RATIO: 1.7 (calc) (ref 1.0–2.5)
ALT: 34 U/L (ref 9–46)
AST: 27 U/L (ref 10–35)
Albumin: 4 g/dL (ref 3.6–5.1)
Alkaline phosphatase (APISO): 56 U/L (ref 40–115)
BUN: 10 mg/dL (ref 7–25)
CALCIUM: 8.9 mg/dL (ref 8.6–10.3)
CO2: 18 mmol/L — ABNORMAL LOW (ref 20–32)
Chloride: 110 mmol/L (ref 98–110)
Creat: 1.1 mg/dL (ref 0.70–1.33)
GFR, EST AFRICAN AMERICAN: 90 mL/min/{1.73_m2} (ref >=60)
GFR, EST NON AFRICAN AMERICAN: 78 mL/min/{1.73_m2} (ref >=60)
GLOBULIN: 2.4 g/dL (ref 1.9–3.7)
Glucose, Bld: 130 mg/dL — ABNORMAL HIGH (ref 65–99)
POTASSIUM: 3.8 mmol/L (ref 3.5–5.3)
SODIUM: 140 mmol/L (ref 135–146)
TOTAL PROTEIN: 6.4 g/dL (ref 6.1–8.1)
Total Bilirubin: 0.3 mg/dL (ref 0.2–1.2)

## 2018-06-10 LAB — CBC WITH DIFFERENTIAL/PLATELET
BASOS ABS: 51 {cells}/uL (ref 0–200)
BASOS PCT: 0.7 %
EOS ABS: 102 {cells}/uL (ref 15–500)
Eosinophils Relative: 1.4 %
HCT: 40.2 % (ref 38.5–50.0)
Hemoglobin: 13.6 g/dL (ref 13.2–17.1)
LYMPHS ABS: 2241 {cells}/uL (ref 850–3900)
MCH: 33.8 pg — ABNORMAL HIGH (ref 27.0–33.0)
MCHC: 33.8 g/dL (ref 32.0–36.0)
MCV: 100 fL (ref 80.0–100.0)
MPV: 11 fL (ref 7.5–12.5)
Monocytes Relative: 9.2 %
NEUTROS ABS: 4234 {cells}/uL (ref 1500–7800)
NEUTROS PCT: 58 %
Platelets: 191 10*3/uL (ref 140–400)
RBC: 4.02 10*6/uL — AB (ref 4.20–5.80)
RDW: 13.1 % (ref 11.0–15.0)
Total Lymphocyte: 30.7 %
WBC mixed population: 672 cells/uL (ref 200–950)
WBC: 7.3 10*3/uL (ref 3.8–10.8)

## 2018-06-10 LAB — URINE CYTOLOGY ANCILLARY ONLY
CHLAMYDIA, DNA PROBE: NEGATIVE
Neisseria Gonorrhea: NEGATIVE

## 2018-06-10 LAB — RPR: RPR Ser Ql: NONREACTIVE

## 2018-06-10 LAB — LIPID PANEL
CHOL/HDL RATIO: 3.3 (calc) (ref <5.0)
CHOLESTEROL: 177 mg/dL (ref <200)
HDL: 54 mg/dL (ref >40)
LDL Cholesterol (Calc): 105 mg/dL (calc) — ABNORMAL HIGH
Non-HDL Cholesterol (Calc): 123 mg/dL (calc) (ref <130)
Triglycerides: 90 mg/dL (ref <150)

## 2018-06-10 LAB — T-HELPER CELL (CD4) - (RCID CLINIC ONLY)
CD4 T CELL HELPER: 42 % (ref 33–55)
CD4 T Cell Abs: 990 /uL (ref 400–2700)

## 2018-06-11 LAB — HIV-1 RNA QUANT-NO REFLEX-BLD
HIV 1 RNA Quant: <20 copies/mL
HIV-1 RNA Quant, Log: <1.3 Log copies/mL

## 2018-06-15 ENCOUNTER — Other Ambulatory Visit: Payer: Self-pay | Admitting: Internal Medicine

## 2018-06-20 ENCOUNTER — Emergency Department (HOSPITAL_COMMUNITY)
Admission: EM | Admit: 2018-06-20 | Discharge: 2018-06-20 | Disposition: A | Discharge status: Home / Self Care | Payer: Self-pay | Attending: Emergency Medicine | Admitting: Emergency Medicine

## 2018-06-20 ENCOUNTER — Encounter (HOSPITAL_COMMUNITY): Payer: Self-pay | Admitting: Emergency Medicine

## 2018-06-20 DIAGNOSIS — Y939 Activity, unspecified: Secondary | ICD-10-CM | POA: Insufficient documentation

## 2018-06-20 DIAGNOSIS — W25XXXA Contact with sharp glass, initial encounter: Secondary | ICD-10-CM | POA: Insufficient documentation

## 2018-06-20 DIAGNOSIS — Y999 Unspecified external cause status: Secondary | ICD-10-CM | POA: Insufficient documentation

## 2018-06-20 DIAGNOSIS — Y929 Unspecified place or not applicable: Secondary | ICD-10-CM | POA: Insufficient documentation

## 2018-06-20 DIAGNOSIS — Z5321 Procedure and treatment not carried out due to patient leaving prior to being seen by health care provider: Secondary | ICD-10-CM | POA: Insufficient documentation

## 2018-06-20 DIAGNOSIS — S0101XA Laceration without foreign body of scalp, initial encounter: Secondary | ICD-10-CM | POA: Insufficient documentation
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
## 2018-06-20 NOTE — ED Notes (Signed)
RN found Pt, asked for him to come back to room. Pt declined wanting Medical treatment any more.  Pt is with family in [place].
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
## 2018-06-21 ENCOUNTER — Encounter: Payer: Self-pay | Admitting: Internal Medicine

## 2018-06-21 ENCOUNTER — Ambulatory Visit (INDEPENDENT_AMBULATORY_CARE_PROVIDER_SITE_OTHER): Payer: Self-pay | Admitting: Internal Medicine

## 2018-06-21 VITALS — BP 113/79 | HR 103 | Temp 98.4°F | Ht 72.0 in | Wt 162.8 lb

## 2018-06-21 DIAGNOSIS — B20 Human immunodeficiency virus [HIV] disease: Secondary | ICD-10-CM

## 2018-06-21 DIAGNOSIS — Z5181 Encounter for therapeutic drug level monitoring: Secondary | ICD-10-CM | POA: Insufficient documentation

## 2018-06-21 DIAGNOSIS — Z23 Encounter for immunization: Secondary | ICD-10-CM | POA: Insufficient documentation

## 2018-06-21 DIAGNOSIS — Z113 Encounter for screening for infections with a predominantly sexual mode of transmission: Secondary | ICD-10-CM

## 2018-06-21 MED ORDER — PNEUMOCOCCAL 13-VAL CONJ VACC IM SUSP
0.5000 mL | Freq: Once | INTRAMUSCULAR | Status: AC
  Administered 2018-06-21: 0.5 mL via INTRAMUSCULAR

## 2018-06-21 NOTE — Assessment & Plan Note (Signed)
Creat, LFTs, wnl.  No issues.

## 2018-06-21 NOTE — Assessment & Plan Note (Signed)
Prevnar today and can do pneumococcal next visit

## 2018-06-21 NOTE — Assessment & Plan Note (Signed)
Screened negative 

## 2018-06-21 NOTE — Assessment & Plan Note (Signed)
Doing great on biktarvy.  Will continue and rtc 6 months.

## 2018-06-21 NOTE — Progress Notes (Signed)
   Subjective:    Patient ID: Gregory Grant, male    DOB: May 03, 1966, 52 y.o.   MRN: [id number]  HPI Here for follow up of HIV. Started on [place] last year and no missed doses.  Now has established with a PCP. CD4 990 and viral load < 20.  Creat, LFTs, wnl.  No new issues. No associated n/v/d.  No rashes.     Review of Systems  Constitutional: Negative for fatigue.  Gastrointestinal: Negative for abdominal pain, diarrhea and nausea.  Skin: Negative for rash.       Objective:   Physical Exam  Constitutional: He appears well-developed and well-nourished. No distress.  HENT:  Mouth/Throat: No oropharyngeal exudate.  Eyes: No scleral icterus.  Cardiovascular: Normal rate, regular rhythm and normal heart sounds.  No murmur heard. Pulmonary/Chest: Effort normal and breath sounds normal. No respiratory distress.  Skin: No rash noted.   SH: + tobacco       Assessment & Plan:

## 2018-07-12 ENCOUNTER — Other Ambulatory Visit: Payer: Self-pay | Admitting: Internal Medicine

## 2018-07-21 ENCOUNTER — Encounter: Payer: Self-pay | Admitting: Internal Medicine

## 2018-08-10 ENCOUNTER — Other Ambulatory Visit: Payer: Self-pay | Admitting: Internal Medicine

## 2018-08-10 DIAGNOSIS — B20 Human immunodeficiency virus [HIV] disease: Secondary | ICD-10-CM

## 2018-12-13 ENCOUNTER — Other Ambulatory Visit: Payer: Self-pay

## 2018-12-13 DIAGNOSIS — B20 Human immunodeficiency virus [HIV] disease: Secondary | ICD-10-CM

## 2018-12-14 LAB — T-HELPER CELL (CD4) - (RCID CLINIC ONLY)
CD4 % Helper T Cell: 46 % (ref 33–55)
CD4 T CELL ABS: 1050 /uL (ref 400–2700)

## 2018-12-16 LAB — HIV-1 RNA QUANT-NO REFLEX-BLD
HIV 1 RNA QUANT: NOT DETECTED {copies}/mL
HIV-1 RNA QUANT, LOG: NOT DETECTED {Log_copies}/mL

## 2018-12-27 ENCOUNTER — Encounter: Payer: Self-pay | Admitting: Internal Medicine

## 2018-12-27 ENCOUNTER — Ambulatory Visit (INDEPENDENT_AMBULATORY_CARE_PROVIDER_SITE_OTHER): Payer: Self-pay | Admitting: Internal Medicine

## 2018-12-27 VITALS — BP 126/78 | HR 75 | Temp 98.0°F | Wt 169.0 lb

## 2018-12-27 DIAGNOSIS — Z5181 Encounter for therapeutic drug level monitoring: Secondary | ICD-10-CM

## 2018-12-27 DIAGNOSIS — Z716 Tobacco abuse counseling: Secondary | ICD-10-CM

## 2018-12-27 DIAGNOSIS — Z113 Encounter for screening for infections with a predominantly sexual mode of transmission: Secondary | ICD-10-CM

## 2018-12-27 DIAGNOSIS — Z7185 Encounter for immunization safety counseling: Secondary | ICD-10-CM

## 2018-12-27 DIAGNOSIS — Z7189 Other specified counseling: Secondary | ICD-10-CM

## 2018-12-27 DIAGNOSIS — B20 Human immunodeficiency virus [HIV] disease: Secondary | ICD-10-CM

## 2018-12-27 DIAGNOSIS — Z23 Encounter for immunization: Secondary | ICD-10-CM | POA: Insufficient documentation

## 2018-12-27 NOTE — Progress Notes (Signed)
   Subjective:    Patient ID: Gregory LipsJames Mcmillen, male    DOB: 1966-11-12, 53 y.o.   MRN: [id number]  HPI Here for follow up of HIV. Continues on Biktarvy and no missed doses. CD4 1050 and viral load < 20.  No new issues. No associated n/v/d.  No rashes.     Review of Systems  Constitutional: Negative for fatigue.  Gastrointestinal: Negative for abdominal pain, diarrhea and nausea.  Skin: Negative for rash.       Objective:   Physical Exam Constitutional:      General: He is not in acute distress.    Appearance: He is well-developed.  HENT:     Mouth/Throat:     Pharynx: No oropharyngeal exudate.  Eyes:     General: No scleral icterus. Cardiovascular:     Rate and Rhythm: Normal rate and regular rhythm.     Heart sounds: Normal heart sounds. No murmur.  Pulmonary:     Effort: Pulmonary effort is normal. No respiratory distress.     Breath sounds: Normal breath sounds.  Skin:    Findings: No rash.    SH: + tobacco and no interest in quitting       Assessment & Plan:

## 2018-12-27 NOTE — Assessment & Plan Note (Signed)
Discussed and precontemplative.  Will continue to remind him of the health benefits of quitting

## 2018-12-27 NOTE — Assessment & Plan Note (Signed)
Doing well, rtc 6 months.  

## 2018-12-27 NOTE — Assessment & Plan Note (Signed)
Discussed flu shot and given today 

## 2018-12-29 ENCOUNTER — Other Ambulatory Visit: Payer: Self-pay | Admitting: Internal Medicine

## 2018-12-29 DIAGNOSIS — B20 Human immunodeficiency virus [HIV] disease: Secondary | ICD-10-CM

## 2019-02-07 ENCOUNTER — Encounter: Payer: Self-pay | Admitting: Internal Medicine

## 2019-06-13 ENCOUNTER — Other Ambulatory Visit: Payer: Self-pay

## 2019-06-13 DIAGNOSIS — Z5181 Encounter for therapeutic drug level monitoring: Secondary | ICD-10-CM

## 2019-06-13 DIAGNOSIS — Z113 Encounter for screening for infections with a predominantly sexual mode of transmission: Secondary | ICD-10-CM

## 2019-06-13 DIAGNOSIS — B20 Human immunodeficiency virus [HIV] disease: Secondary | ICD-10-CM

## 2019-06-14 LAB — URINE CYTOLOGY ANCILLARY ONLY
Chlamydia: NEGATIVE
Neisseria Gonorrhea: NEGATIVE

## 2019-06-14 LAB — T-HELPER CELL (CD4) - (RCID CLINIC ONLY)
CD4 % Helper T Cell: 56 % (ref 33–65)
CD4 T Cell Abs: 1280 /uL (ref 400–1790)

## 2019-06-21 LAB — COMPLETE METABOLIC PANEL WITH GFR
AG Ratio: 1.7 (calc) (ref 1.0–2.5)
ALT: 13 U/L (ref 9–46)
AST: 20 U/L (ref 10–35)
Albumin: 4.4 g/dL (ref 3.6–5.1)
Alkaline phosphatase (APISO): 60 U/L (ref 35–144)
BUN: 11 mg/dL (ref 7–25)
CO2: 23 mmol/L (ref 20–32)
Calcium: 9.9 mg/dL (ref 8.6–10.3)
Chloride: 104 mmol/L (ref 98–110)
Creat: 1.08 mg/dL (ref 0.70–1.33)
GFR, Est African American: 92 mL/min/{1.73_m2} (ref >=60)
GFR, Est Non African American: 79 mL/min/{1.73_m2} (ref >=60)
Globulin: 2.6 g/dL (calc) (ref 1.9–3.7)
Glucose, Bld: 92 mg/dL (ref 65–99)
Potassium: 4.1 mmol/L (ref 3.5–5.3)
Sodium: 137 mmol/L (ref 135–146)
Total Bilirubin: 0.6 mg/dL (ref 0.2–1.2)
Total Protein: 7 g/dL (ref 6.1–8.1)

## 2019-06-21 LAB — CBC WITH DIFFERENTIAL/PLATELET
Absolute Monocytes: 623 cells/uL (ref 200–950)
Basophils Absolute: 53 cells/uL (ref 0–200)
Basophils Relative: 0.7 %
Eosinophils Absolute: 68 cells/uL (ref 15–500)
Eosinophils Relative: 0.9 %
HCT: 44.4 % (ref 38.5–50.0)
Hemoglobin: 15.7 g/dL (ref 13.2–17.1)
Lymphs Abs: 2500 cells/uL (ref 850–3900)
MCH: 34.6 pg — ABNORMAL HIGH (ref 27.0–33.0)
MCHC: 35.4 g/dL (ref 32.0–36.0)
MCV: 97.8 fL (ref 80.0–100.0)
MPV: 10.7 fL (ref 7.5–12.5)
Monocytes Relative: 8.2 %
Neutro Abs: 4355 cells/uL (ref 1500–7800)
Neutrophils Relative %: 57.3 %
Platelets: 210 10*3/uL (ref 140–400)
RBC: 4.54 10*6/uL (ref 4.20–5.80)
RDW: 13.6 % (ref 11.0–15.0)
Total Lymphocyte: 32.9 %
WBC: 7.6 10*3/uL (ref 3.8–10.8)

## 2019-06-21 LAB — LIPID PANEL
Cholesterol: 193 mg/dL (ref <200)
HDL: 54 mg/dL (ref >=40)
LDL Cholesterol (Calc): 117 mg/dL (calc) — ABNORMAL HIGH
Non-HDL Cholesterol (Calc): 139 mg/dL (calc) — ABNORMAL HIGH (ref <130)
Total CHOL/HDL Ratio: 3.6 (calc) (ref <5.0)
Triglycerides: 109 mg/dL (ref <150)

## 2019-06-21 LAB — HIV-1 RNA QUANT-NO REFLEX-BLD
HIV 1 RNA Quant: <20 copies/mL
HIV-1 RNA Quant, Log: <1.3 Log copies/mL

## 2019-06-21 LAB — RPR: RPR Ser Ql: NONREACTIVE

## 2019-06-24 ENCOUNTER — Encounter (HOSPITAL_COMMUNITY): Payer: Self-pay | Admitting: Emergency Medicine

## 2019-06-24 ENCOUNTER — Inpatient Hospital Stay (HOSPITAL_COMMUNITY): Payer: Self-pay

## 2019-06-24 ENCOUNTER — Emergency Department (HOSPITAL_COMMUNITY): Payer: Self-pay

## 2019-06-24 ENCOUNTER — Other Ambulatory Visit: Payer: Self-pay

## 2019-06-24 ENCOUNTER — Inpatient Hospital Stay (HOSPITAL_COMMUNITY)
Admission: EM | Admit: 2019-06-24 | Discharge: 2019-06-25 | DRG: 247 | Disposition: A | Discharge status: Home / Self Care | Payer: Self-pay | Attending: Cardiology | Admitting: Cardiology

## 2019-06-24 ENCOUNTER — Encounter (HOSPITAL_COMMUNITY)
Admission: EM | Disposition: A | Discharge status: Home / Self Care | Payer: Self-pay | Source: Home / Self Care | Attending: Cardiology

## 2019-06-24 DIAGNOSIS — I2119 ST elevation (STEMI) myocardial infarction involving other coronary artery of inferior wall: Principal | ICD-10-CM | POA: Diagnosis present

## 2019-06-24 DIAGNOSIS — Z955 Presence of coronary angioplasty implant and graft: Secondary | ICD-10-CM

## 2019-06-24 DIAGNOSIS — Z1159 Encounter for screening for other viral diseases: Secondary | ICD-10-CM

## 2019-06-24 DIAGNOSIS — E872 Acidosis: Secondary | ICD-10-CM | POA: Diagnosis present

## 2019-06-24 DIAGNOSIS — F17210 Nicotine dependence, cigarettes, uncomplicated: Secondary | ICD-10-CM | POA: Diagnosis present

## 2019-06-24 DIAGNOSIS — I25110 Atherosclerotic heart disease of native coronary artery with unstable angina pectoris: Secondary | ICD-10-CM | POA: Diagnosis present

## 2019-06-24 DIAGNOSIS — I472 Ventricular tachycardia: Secondary | ICD-10-CM | POA: Diagnosis present

## 2019-06-24 DIAGNOSIS — I959 Hypotension, unspecified: Secondary | ICD-10-CM | POA: Diagnosis not present

## 2019-06-24 DIAGNOSIS — Z8249 Family history of ischemic heart disease and other diseases of the circulatory system: Secondary | ICD-10-CM

## 2019-06-24 DIAGNOSIS — E785 Hyperlipidemia, unspecified: Secondary | ICD-10-CM | POA: Diagnosis present

## 2019-06-24 DIAGNOSIS — Z716 Tobacco abuse counseling: Secondary | ICD-10-CM

## 2019-06-24 DIAGNOSIS — I2510 Atherosclerotic heart disease of native coronary artery without angina pectoris: Secondary | ICD-10-CM | POA: Diagnosis present

## 2019-06-24 DIAGNOSIS — E782 Mixed hyperlipidemia: Secondary | ICD-10-CM | POA: Diagnosis present

## 2019-06-24 DIAGNOSIS — I213 ST elevation (STEMI) myocardial infarction of unspecified site: Secondary | ICD-10-CM

## 2019-06-24 DIAGNOSIS — Z21 Asymptomatic human immunodeficiency virus [HIV] infection status: Secondary | ICD-10-CM | POA: Diagnosis present

## 2019-06-24 DIAGNOSIS — I2111 ST elevation (STEMI) myocardial infarction involving right coronary artery: Secondary | ICD-10-CM

## 2019-06-24 DIAGNOSIS — B20 Human immunodeficiency virus [HIV] disease: Secondary | ICD-10-CM | POA: Diagnosis present

## 2019-06-24 HISTORY — PX: CORONARY STENT INTERVENTION: CATH118234

## 2019-06-24 HISTORY — PX: LEFT HEART CATH AND CORONARY ANGIOGRAPHY: CATH118249

## 2019-06-24 HISTORY — PX: CORONARY/GRAFT ACUTE MI REVASCULARIZATION: CATH118305

## 2019-06-24 HISTORY — DX: Asymptomatic human immunodeficiency virus (hiv) infection status: Z21

## 2019-06-24 HISTORY — DX: Human immunodeficiency virus (HIV) disease: B20

## 2019-06-24 LAB — CBC
HCT: 39.6 % (ref 39.0–52.0)
HCT: 39.1 % (ref 39.0–52.0)
Hemoglobin: 13.6 g/dL (ref 13.0–17.0)
Hemoglobin: 13.3 g/dL (ref 13.0–17.0)
MCH: 34 pg (ref 26.0–34.0)
MCH: 34.1 pg — ABNORMAL HIGH (ref 26.0–34.0)
MCHC: 34.3 g/dL (ref 30.0–36.0)
MCHC: 34 g/dL (ref 30.0–36.0)
MCV: 99 fL (ref 80.0–100.0)
MCV: 100.3 fL — ABNORMAL HIGH (ref 80.0–100.0)
Platelets: 164 10*3/uL (ref 150–400)
Platelets: 151 10*3/uL (ref 150–400)
RBC: 4 MIL/uL — ABNORMAL LOW (ref 4.22–5.81)
RBC: 3.9 MIL/uL — ABNORMAL LOW (ref 4.22–5.81)
RDW: 14.1 % (ref 11.5–15.5)
RDW: 14.3 % (ref 11.5–15.5)
WBC: 6.8 10*3/uL (ref 4.0–10.5)
WBC: 5.8 10*3/uL (ref 4.0–10.5)
nRBC: 0 % (ref 0.0–0.2)
nRBC: 0 % (ref 0.0–0.2)

## 2019-06-24 LAB — CBC WITH DIFFERENTIAL/PLATELET
Abs Immature Granulocytes: 0.04 10*3/uL (ref 0.00–0.07)
Basophils Absolute: 0 10*3/uL (ref 0.0–0.1)
Basophils Relative: 1 %
Eosinophils Absolute: 0.2 10*3/uL (ref 0.0–0.5)
Eosinophils Relative: 2 %
HCT: 40.1 % (ref 39.0–52.0)
Hemoglobin: 13.7 g/dL (ref 13.0–17.0)
Immature Granulocytes: 1 %
Lymphocytes Relative: 36 %
Lymphs Abs: 2.8 10*3/uL (ref 0.7–4.0)
MCH: 34.7 pg — ABNORMAL HIGH (ref 26.0–34.0)
MCHC: 34.2 g/dL (ref 30.0–36.0)
MCV: 101.5 fL — ABNORMAL HIGH (ref 80.0–100.0)
Monocytes Absolute: 0.7 10*3/uL (ref 0.1–1.0)
Monocytes Relative: 9 %
Neutro Abs: 4.1 10*3/uL (ref 1.7–7.7)
Neutrophils Relative %: 51 %
Platelets: 182 10*3/uL (ref 150–400)
RBC: 3.95 MIL/uL — ABNORMAL LOW (ref 4.22–5.81)
RDW: 14.5 % (ref 11.5–15.5)
WBC: 7.9 10*3/uL (ref 4.0–10.5)
nRBC: 0 % (ref 0.0–0.2)

## 2019-06-24 LAB — CREATININE, SERUM
Creatinine, Ser: 1 mg/dL (ref 0.61–1.24)
GFR calc Af Amer: >60 mL/min (ref >60)
GFR calc non Af Amer: >60 mL/min (ref >60)

## 2019-06-24 LAB — POCT I-STAT 7, (LYTES, BLD GAS, ICA,H+H)
Acid-base deficit: 10 mmol/L — ABNORMAL HIGH (ref 0.0–2.0)
Acid-base deficit: 7 mmol/L — ABNORMAL HIGH (ref 0.0–2.0)
Bicarbonate: 16.9 mmol/L — ABNORMAL LOW (ref 20.0–28.0)
Bicarbonate: 19.4 mmol/L — ABNORMAL LOW (ref 20.0–28.0)
Calcium, Ion: 1.11 mmol/L — ABNORMAL LOW (ref 1.15–1.40)
Calcium, Ion: 1.22 mmol/L (ref 1.15–1.40)
HCT: 39 % (ref 39.0–52.0)
HCT: 43 % (ref 39.0–52.0)
Hemoglobin: 13.3 g/dL (ref 13.0–17.0)
Hemoglobin: 14.6 g/dL (ref 13.0–17.0)
O2 Saturation: 98 %
O2 Saturation: 99 %
Potassium: 3.4 mmol/L — ABNORMAL LOW (ref 3.5–5.1)
Potassium: 3.7 mmol/L (ref 3.5–5.1)
Sodium: 127 mmol/L — ABNORMAL LOW (ref 135–145)
Sodium: 139 mmol/L (ref 135–145)
TCO2: 18 mmol/L — ABNORMAL LOW (ref 22–32)
TCO2: 21 mmol/L — ABNORMAL LOW (ref 22–32)
pCO2 arterial: 40.5 mmHg (ref 32.0–48.0)
pCO2 arterial: 41.1 mmHg (ref 32.0–48.0)
pH, Arterial: 7.229 — ABNORMAL LOW (ref 7.350–7.450)
pH, Arterial: 7.282 — ABNORMAL LOW (ref 7.350–7.450)
pO2, Arterial: 126 mmHg — ABNORMAL HIGH (ref 83.0–108.0)
pO2, Arterial: 141 mmHg — ABNORMAL HIGH (ref 83.0–108.0)

## 2019-06-24 LAB — LIPID PANEL
Cholesterol: 169 mg/dL (ref 0–200)
HDL: 38 mg/dL — ABNORMAL LOW (ref >40)
LDL Cholesterol: 120 mg/dL — ABNORMAL HIGH (ref 0–99)
Total CHOL/HDL Ratio: 4.4 RATIO
Triglycerides: 56 mg/dL (ref <150)
VLDL: 11 mg/dL (ref 0–40)

## 2019-06-24 LAB — PROTIME-INR
INR: 1.1 (ref 0.8–1.2)
Prothrombin Time: 14.1 seconds (ref 11.4–15.2)

## 2019-06-24 LAB — POCT I-STAT, CHEM 8
BUN: 11 mg/dL (ref 6–20)
Calcium, Ion: 1.17 mmol/L (ref 1.15–1.40)
Chloride: 104 mmol/L (ref 98–111)
Creatinine, Ser: 1.1 mg/dL (ref 0.61–1.24)
Glucose, Bld: 97 mg/dL (ref 70–99)
HCT: 41 % (ref 39.0–52.0)
Hemoglobin: 13.9 g/dL (ref 13.0–17.0)
Potassium: 3.7 mmol/L (ref 3.5–5.1)
Sodium: 137 mmol/L (ref 135–145)
TCO2: 21 mmol/L — ABNORMAL LOW (ref 22–32)

## 2019-06-24 LAB — ECHOCARDIOGRAM COMPLETE
Height: 72 in
Weight: 3600 oz

## 2019-06-24 LAB — POCT ACTIVATED CLOTTING TIME
Activated Clotting Time: 246 seconds
Activated Clotting Time: 373 seconds

## 2019-06-24 LAB — COMPREHENSIVE METABOLIC PANEL
ALT: 16 U/L (ref 0–44)
AST: 22 U/L (ref 15–41)
Albumin: 3.7 g/dL (ref 3.5–5.0)
Alkaline Phosphatase: 57 U/L (ref 38–126)
Anion gap: 12 (ref 5–15)
BUN: 12 mg/dL (ref 6–20)
CO2: 19 mmol/L — ABNORMAL LOW (ref 22–32)
Calcium: 8.3 mg/dL — ABNORMAL LOW (ref 8.9–10.3)
Chloride: 106 mmol/L (ref 98–111)
Creatinine, Ser: 1.08 mg/dL (ref 0.61–1.24)
GFR calc Af Amer: >60 mL/min (ref >60)
GFR calc non Af Amer: >60 mL/min (ref >60)
Glucose, Bld: 93 mg/dL (ref 70–99)
Potassium: 3.7 mmol/L (ref 3.5–5.1)
Sodium: 137 mmol/L (ref 135–145)
Total Bilirubin: 0.6 mg/dL (ref 0.3–1.2)
Total Protein: 6.5 g/dL (ref 6.5–8.1)

## 2019-06-24 LAB — POCT I-STAT CREATININE: Creatinine, Ser: 1 mg/dL (ref 0.61–1.24)

## 2019-06-24 LAB — SARS CORONAVIRUS 2 BY RT PCR (HOSPITAL ORDER, PERFORMED IN ~~LOC~~ HOSPITAL LAB): SARS Coronavirus 2: NEGATIVE

## 2019-06-24 LAB — MRSA PCR SCREENING: MRSA by PCR: NEGATIVE

## 2019-06-24 LAB — TROPONIN I (HIGH SENSITIVITY)
Troponin I (High Sensitivity): 52 ng/L — ABNORMAL HIGH (ref <18)
Troponin I (High Sensitivity): 1829 ng/L (ref <18)
Troponin I (High Sensitivity): 17034 ng/L (ref <18)
Troponin I (High Sensitivity): 18593 ng/L (ref <18)

## 2019-06-24 LAB — MAGNESIUM: Magnesium: 1.8 mg/dL (ref 1.7–2.4)

## 2019-06-24 LAB — APTT: aPTT: 117 seconds — ABNORMAL HIGH (ref 24–36)

## 2019-06-24 SURGERY — CORONARY/GRAFT ACUTE MI REVASCULARIZATION
Anesthesia: LOCAL

## 2019-06-24 MED ORDER — TICAGRELOR 90 MG PO TABS: 90.0000 mg | ORAL_TABLET | Freq: Two times a day (BID) | ORAL | 11 refills | Status: DC

## 2019-06-24 MED ORDER — TICAGRELOR 90 MG PO TABS
90.0000 mg | ORAL_TABLET | Freq: Two times a day (BID) | ORAL | Status: DC
  Administered 2019-06-24 – 2019-06-25 (×2): 90 mg via ORAL
  Filled 2019-06-24 (×2): qty 1

## 2019-06-24 MED ORDER — CANGRELOR TETRASODIUM 50 MG IV SOLR
INTRAVENOUS | Status: AC
  Filled 2019-06-24: qty 50

## 2019-06-24 MED ORDER — BICTEGRAVIR-EMTRICITAB-TENOFOV 50-200-25 MG PO TABS
1.0000 | ORAL_TABLET | Freq: Every day | ORAL | Status: DC
  Filled 2019-06-24: qty 1

## 2019-06-24 MED ORDER — ONDANSETRON HCL 4 MG/2ML IJ SOLN: 4.0000 mg | Freq: Four times a day (QID) | INTRAMUSCULAR | Status: DC | PRN

## 2019-06-24 MED ORDER — IOHEXOL 350 MG/ML SOLN
INTRAVENOUS | Status: DC | PRN
  Administered 2019-06-24: 170 mL via INTRA_ARTERIAL

## 2019-06-24 MED ORDER — SODIUM CHLORIDE 0.9 % IV SOLN
INTRAVENOUS | Status: AC
  Administered 2019-06-24: 06:00:00 via INTRAVENOUS

## 2019-06-24 MED ORDER — HYDRALAZINE HCL 20 MG/ML IJ SOLN: 10.0000 mg | INTRAMUSCULAR | Status: AC | PRN

## 2019-06-24 MED ORDER — HEPARIN SODIUM (PORCINE) 5000 UNIT/ML IJ SOLN
4000.0000 [IU] | Freq: Once | INTRAMUSCULAR | Status: AC
  Administered 2019-06-24: 4000 [IU] via INTRAVENOUS
  Filled 2019-06-24: qty 1

## 2019-06-24 MED ORDER — ATORVASTATIN CALCIUM 80 MG PO TABS
80.0000 mg | ORAL_TABLET | Freq: Every day | ORAL | Status: DC
  Administered 2019-06-24: 80 mg via ORAL
  Filled 2019-06-24: qty 1

## 2019-06-24 MED ORDER — SODIUM BICARBONATE 8.4 % IV SOLN
50.0000 meq | Freq: Once | INTRAVENOUS | Status: AC
  Administered 2019-06-24: 50 meq via INTRAVENOUS
  Filled 2019-06-24: qty 50

## 2019-06-24 MED ORDER — CANGRELOR BOLUS VIA INFUSION
INTRAVENOUS | Status: DC | PRN
  Administered 2019-06-24: 3063 ug via INTRAVENOUS

## 2019-06-24 MED ORDER — VERAPAMIL HCL 2.5 MG/ML IV SOLN
INTRAVENOUS | Status: AC
  Filled 2019-06-24: qty 2

## 2019-06-24 MED ORDER — LIDOCAINE HCL (PF) 1 % IJ SOLN
INTRAMUSCULAR | Status: AC
  Filled 2019-06-24: qty 30

## 2019-06-24 MED ORDER — SODIUM CHLORIDE 0.9% FLUSH
3.0000 mL | Freq: Two times a day (BID) | INTRAVENOUS | Status: DC
  Administered 2019-06-24 – 2019-06-25 (×3): 3 mL via INTRAVENOUS

## 2019-06-24 MED ORDER — LABETALOL HCL 5 MG/ML IV SOLN: 10.0000 mg | INTRAVENOUS | Status: AC | PRN

## 2019-06-24 MED ORDER — MAGNESIUM SULFATE 2 GM/50ML IV SOLN
2.0000 g | Freq: Once | INTRAVENOUS | Status: AC
  Administered 2019-06-24: 09:00:00 2 g via INTRAVENOUS
  Filled 2019-06-24: qty 50

## 2019-06-24 MED ORDER — HEPARIN SODIUM (PORCINE) 1000 UNIT/ML IJ SOLN
INTRAMUSCULAR | Status: AC
  Filled 2019-06-24: qty 1

## 2019-06-24 MED ORDER — POTASSIUM CHLORIDE CRYS ER 20 MEQ PO TBCR
40.0000 meq | EXTENDED_RELEASE_TABLET | Freq: Once | ORAL | Status: AC
  Administered 2019-06-24: 40 meq via ORAL
  Filled 2019-06-24: qty 2

## 2019-06-24 MED ORDER — VERAPAMIL HCL 2.5 MG/ML IV SOLN
INTRAVENOUS | Status: DC | PRN
  Administered 2019-06-24: 04:00:00 10 mL via INTRA_ARTERIAL

## 2019-06-24 MED ORDER — CHLORHEXIDINE GLUCONATE CLOTH 2 % EX PADS
6.0000 | MEDICATED_PAD | Freq: Every day | CUTANEOUS | Status: DC
  Administered 2019-06-24 – 2019-06-25 (×2): 6 via TOPICAL

## 2019-06-24 MED ORDER — MIDAZOLAM HCL 2 MG/2ML IJ SOLN
INTRAMUSCULAR | Status: DC | PRN
  Administered 2019-06-24: 1 mg via INTRAVENOUS

## 2019-06-24 MED ORDER — HEPARIN (PORCINE) IN NACL 1000-0.9 UT/500ML-% IV SOLN
INTRAVENOUS | Status: DC | PRN
  Administered 2019-06-24 (×2): 500 mL

## 2019-06-24 MED ORDER — LIDOCAINE HCL (PF) 1 % IJ SOLN
INTRAMUSCULAR | Status: DC | PRN
  Administered 2019-06-24: 2 mL via SUBCUTANEOUS

## 2019-06-24 MED ORDER — SODIUM CHLORIDE 0.9% FLUSH: 3.0000 mL | INTRAVENOUS | Status: DC | PRN

## 2019-06-24 MED ORDER — SODIUM CHLORIDE 0.9 % IV SOLN
INTRAVENOUS | Status: AC | PRN
  Administered 2019-06-24: 4 ug/kg/min via INTRAVENOUS

## 2019-06-24 MED ORDER — SODIUM CHLORIDE 0.9 % IV SOLN
INTRAVENOUS | Status: DC
  Administered 2019-06-24: 04:00:00 via INTRAVENOUS

## 2019-06-24 MED ORDER — ASPIRIN 81 MG PO CHEW
81.0000 mg | CHEWABLE_TABLET | Freq: Every day | ORAL | Status: DC
  Administered 2019-06-24 – 2019-06-25 (×2): 81 mg via ORAL
  Filled 2019-06-24 (×2): qty 1

## 2019-06-24 MED ORDER — MIDAZOLAM HCL 2 MG/2ML IJ SOLN
INTRAMUSCULAR | Status: AC
  Filled 2019-06-24: qty 2

## 2019-06-24 MED ORDER — ACETAMINOPHEN 325 MG PO TABS: 650.0000 mg | ORAL_TABLET | ORAL | Status: DC | PRN

## 2019-06-24 MED ORDER — HEPARIN SODIUM (PORCINE) 1000 UNIT/ML IJ SOLN
INTRAMUSCULAR | Status: DC | PRN
  Administered 2019-06-24: 5000 [IU] via INTRAVENOUS
  Administered 2019-06-24: 3000 [IU] via INTRAVENOUS

## 2019-06-24 MED ORDER — BICTEGRAVIR-EMTRICITAB-TENOFOV 50-200-25 MG PO TABS
1.0000 | ORAL_TABLET | Freq: Every evening | ORAL | Status: DC
  Administered 2019-06-24: 1 via ORAL
  Filled 2019-06-24: qty 1

## 2019-06-24 MED ORDER — SODIUM CHLORIDE 0.9 % IV SOLN: 250.0000 mL | INTRAVENOUS | Status: DC | PRN

## 2019-06-24 MED ORDER — TICAGRELOR 90 MG PO TABS
ORAL_TABLET | ORAL | Status: AC
  Filled 2019-06-24: qty 2

## 2019-06-24 MED ORDER — TICAGRELOR 90 MG PO TABS
ORAL_TABLET | ORAL | Status: DC | PRN
  Administered 2019-06-24: 180 mg via ORAL

## 2019-06-24 MED ORDER — HEPARIN (PORCINE) IN NACL 1000-0.9 UT/500ML-% IV SOLN
INTRAVENOUS | Status: AC
  Filled 2019-06-24: qty 1000

## 2019-06-24 MED ORDER — SODIUM CHLORIDE 0.9 % IV SOLN
INTRAVENOUS | Status: AC | PRN
  Administered 2019-06-24: 10 mL/h via INTRAVENOUS

## 2019-06-24 MED ORDER — HEPARIN SODIUM (PORCINE) 5000 UNIT/ML IJ SOLN
5000.0000 [IU] | Freq: Three times a day (TID) | INTRAMUSCULAR | Status: DC
  Administered 2019-06-25: 5000 [IU] via SUBCUTANEOUS
  Filled 2019-06-24: qty 1

## 2019-06-24 MED FILL — BRILINTA 90 MG TABLET: 90 | 30 days supply | Qty: 60 | Fill #0

## 2019-06-24 SURGICAL SUPPLY — 21 items
BALLN SAPPHIRE 2.5X12 (BALLOONS) ×2
BALLN SAPPHIRE ~~LOC~~ 3.0X12 (BALLOONS) ×1 IMPLANT
BALLOON SAPPHIRE 2.5X12 (BALLOONS) IMPLANT
CATH INFINITI 5 FR JL3.5 (CATHETERS) ×1 IMPLANT
CATH INFINITI 5FR ANG PIGTAIL (CATHETERS) ×1 IMPLANT
CATH LAUNCHER 6FR JR4 (CATHETERS) ×1 IMPLANT
CATH OPTITORQUE TIG 4.0 5F (CATHETERS) ×1 IMPLANT
DEVICE RAD COMP TR BAND LRG (VASCULAR PRODUCTS) ×1 IMPLANT
GLIDESHEATH SLEND SS 6F .021 (SHEATH) ×1 IMPLANT
GUIDEWIRE INQWIRE 1.5J.035X260 (WIRE) IMPLANT
INQWIRE 1.5J .035X260CM (WIRE) ×2
KIT ENCORE 26 ADVANTAGE (KITS) ×1 IMPLANT
KIT HEART LEFT (KITS) ×2 IMPLANT
PACK CARDIAC CATHETERIZATION (CUSTOM PROCEDURE TRAY) ×2 IMPLANT
SHEATH PROBE COVER 6X72 (BAG) ×1 IMPLANT
STENT SYNERGY DES 2.5X12 (Permanent Stent) ×1 IMPLANT
STENT SYNERGY DES 2.5X16 (Permanent Stent) ×1 IMPLANT
SYR MEDRAD MARK 7 150ML (SYRINGE) ×2 IMPLANT
TRANSDUCER W/STOPCOCK (MISCELLANEOUS) ×2 IMPLANT
TUBING CIL FLEX 10 FLL-RA (TUBING) ×2 IMPLANT
WIRE ASAHI PROWATER 180CM (WIRE) ×1 IMPLANT

## 2019-06-24 NOTE — Care Management (Addendum)
CM consult acknowledged for [facility] assistance. Per the pharmacist note, Kary Kos will be supplied by [facility], and the patients additional discharge medications at [facility]. CM will discuss PCP needs with the patient to assist with establishing a local PCP for post-discharge needs.  ADDENDUM: 06/24/19 @ 1633-Seyon Strader RNCM-CM spoke to the patient via phone to discuss arranging a PCP; the patient verbalized being employed with active health insurance. CM provided patient with a [facility] provider with the information documented on the AVS.  Arjay, BSN, NCM-BC, ACM-RN [phone]

## 2019-06-24 NOTE — Progress Notes (Signed)
  Echocardiogram 2D Echocardiogram has been performed.  Gregory Grant 06/24/2019, 9:08 AM

## 2019-06-24 NOTE — Progress Notes (Signed)
CRITICAL VALUE ALERT  Critical Value:  Troponin 18,593  Date & Time Notied:  06/24/2019 1600  Provider Notified: none  Orders Received/Actions taken: none - expected result

## 2019-06-24 NOTE — H&P (Signed)
Cardiology Admission History and Physical:   Patient ID: Gregory Grant MRN: [id number]; DOB: 11/05/1966   Admission date: 06/24/2019  Primary Care Provider: Patient, No Pcp Per Primary Cardiologist: No primary care provider on file.  Due to [facility] Primary Electrophysiologist:  None * Chief Complaint: Chest pain, inferior STEMI  Patient Profile:   Gregory Grant is a 53 y.o. male with history of well-controlled HIV, smokerwith chest pain having evolutionary changes showing inferior ST elevations on EKG..  Code STEMI called by Dr. Ronny Flurry in the ER.   History of Present Illness:   Gregory Grant  who was in his usual state of health until 2 hours prior to presenting to emergency room via EMS with central chest discomfort radiating to the jaw and hands bilaterally.  Pain was intermittent and then ongoing upon EMS arrival.  Initial EKG evaluations by EMS did not show somewhat subtle initially but progressive flat upsloping ST segment elevations in II, 3 and aVF as well as subtle elevations in V5 V6 concerning for inferior STEMI.  Patient was 8910 chest pain upon arrival now 6 out of 10 chest pain on my evaluation after receiving 6 mg morphine and 1 of sublingual nitroglycerin.  He did have one episode of emesis in the EMS truck prior to arrival but no significant nausea now.  Mild dyspnea but otherwise no major complaints besides some numbness in his hands. He does state that his HIV counts are well controlled.  He denies any rapid irregular heartbeats palpitations.  No PND, orthopnea or edema.  No antecedent chest pain prior to tonight.  He has never had anything like this before. No syncope/near syncope or TIA/amaurosis fugax.  Screening questions recover were performed -> no significant symptoms. Patient was swabbed in the ER.   Heart Pathway Score:     Past Medical History:  Diagnosis Date  . Coughing 02/2015  . GERD (gastroesophageal reflux disease)   . HIV (human  immunodeficiency virus infection) ([place])     No past surgical history on file.   Medications Prior to Admission: Prior to Admission medications   Medication Sig Start Date End Date Taking? Authorizing Provider  atorvastatin (LIPITOR) 10 MG tablet Take 1 tablet (10 mg total) by mouth daily at 6 PM. IM PROGRAM Patient not taking: Reported on 06/21/2018 03/30/18   Colbert Ewing, MD  BIKTARVY 50-200-25 MG TABS tablet TAKE 1 TABLET BY MOUTH DAILY 12/29/18   Comer, Okey Regal, MD     Allergies:   No Known Allergies  Social History:   Social History   Socioeconomic History  . Marital status: Married    Spouse name: Not on file  . Number of children: Not on file  . Years of education: Not on file  . Highest education level: Not on file  Occupational History  . Not on file  Social Needs  . Financial resource strain: Not on file  . Food insecurity    Worry: Not on file    Inability: Not on file  . Transportation needs    Medical: Not on file    Non-medical: Not on file  Tobacco Use  . Smoking status: Current Every Day Smoker    Packs/day: 1.00    Years: 30.00    Pack years: 30.00    Types: Cigarettes  . Smokeless tobacco: Never Used  Substance and Sexual Activity  . Alcohol use: Yes    Alcohol/week: 1.0 standard drinks    Types: 1 Cans of beer per week  .  Drug use: No  . Sexual activity: Yes  Lifestyle  . Physical activity    Days per week: Not on file    Minutes per session: Not on file  . Stress: Not on file  Relationships  . Social Musicianconnections    Talks on phone: Not on file    Gets together: Not on file    Attends religious service: Not on file    Active member of club or organization: Not on file    Attends meetings of clubs or organizations: Not on file    Relationship status: Not on file  . Intimate partner violence    Fear of current or ex partner: Not on file    Emotionally abused: Not on file    Physically abused: Not on file    Forced sexual activity: Not on  file  Other Topics Concern  . Not on file  Social History Narrative  . Not on file    Family History: Per his report without much detail, both sets of maternal and great paternal grandparents had heart disease but he is not sure of details. The patient's family history is not on file.    ROS:  Please see the history of present illness. Patient noted having some nausea with emesis prior to arrival.  Mild dyspnea but no antecedent chest pain episodes. No fevers, chills, sweats.  No recent sick contacts.  No sudden onset dyspnea. All other ROS reviewed and negative.     Physical Exam/Data:   Vitals:   06/24/19 0349 06/24/19 0353 06/24/19 0354 06/24/19 0357  BP:   (!) 159/108   Pulse:  66 74 64  Resp:  14 (!) 21 18  Temp: 98 F (36.7 C)     TempSrc: Oral     SpO2:  100% 100% 100%  Weight:      Height:       No intake or output data in the 24 hours ending 06/24/19 0412 Last 3 Weights 06/24/2019 12/27/2018 06/21/2018  Weight (lbs) 225 lb 169 lb 162 lb 12 oz  Weight (kg) 102.059 kg 76.658 kg 73.823 kg     Body mass index is 30.52 kg/m.  General:  Well nourished, well developed, in moderate acute distress -sick/10 chest pain HEENT: normal Lymph: no adenopathy Neck: no JVD Endocrine:  No thryomegaly Vascular: No carotid bruits; FA pulses 2+ bilaterally without bruits  Cardiac:  normal S1, S2; RRR; no M/R/G Lungs:  clear to auscultation bilaterally, no wheezing, -mild bibasal rhonchi/rales Abd: soft, nontender, no hepatomegaly  Ext: no clubbing cyanosis or edema Musculoskeletal:  No deformities, BUE and BLE strength normal and equal Skin: warm and dry  Neuro:  CNs 2-12 intact, no focal abnormalities noted Psych:  Normal affect    EKG:  The ECG that was done upon arrival to ER was personally reviewed and demonstrates NSR, rate 66 with sinus arrhythmia.  Abnormal R wave progression with notable flat ST segment elevation in II, 3, aVF with subtle depression in aVL and V3 and V4.  1  mm ST elevations in V1 and V6.  Relevant CV Studies: none  Laboratory Data:  High Sensitivity Troponin:  No results for input(s): TROPONINIHS in the last 720 hours.    Cardiac EnzymesNo results for input(s): TROPONINI in the last 168 hours. No results for input(s): TROPIPOC in the last 168 hours.  ChemistryNo results for input(s): NA, K, CL, CO2, GLUCOSE, BUN, CREATININE, CALCIUM, GFRNONAA, GFRAA, ANIONGAP in the last 168 hours.  No results  for input(s): PROT, ALBUMIN, AST, ALT, ALKPHOS, BILITOT in the last 168 hours. Hematology Recent Labs  Lab 06/24/19 0344  WBC 7.9  RBC 3.95*  HGB 13.7  HCT 40.1  MCV 101.5*  MCH 34.7*  MCHC 34.2  RDW 14.5  PLT 182   BNPNo results for input(s): BNP, PROBNP in the last 168 hours.  DDimer No results for input(s): DDIMER in the last 168 hours.   Radiology/Studies:  No results found.  Assessment and Plan:   1. Patient presenting with new onset chest pain and inferior ST elevation MI.  Will proceed to cardiac catheterization lab for emergent catheterization. 2. Blood pressure control per post-cath results.  Suspect beta-blocker at ACE inhibitor/ARB. 3. We will need statin, smoking cessation counseling.. 4. Likely check 2D echo prior to discharge   Severity of Illness: The appropriate patient status for this patient is INPATIENT. Inpatient status is judged to be reasonable and necessary in order to provide the required intensity of service to ensure the patient's safety. The patient's presenting symptoms, physical exam findings, and initial radiographic and laboratory data in the context of their chronic comorbidities is felt to place them at high risk for further clinical deterioration. Furthermore, it is not anticipated that the patient will be medically stable for discharge from the hospital within 2 midnights of admission. The following factors support the patient status of inpatient.   " The patient's presenting symptoms include chest pain  and nausea. " The worrisome physical exam findings include mild basilar rales. " The initial radiographic and laboratory data are worrisome because of abnormal EKG findings concerning for inferior STEMI. " The chronic co-morbidities include smoker, HIV.   * I certify that at the point of admission it is my clinical judgment that the patient will require inpatient hospital care spanning beyond 2 midnights from the point of admission due to high intensity of service, high risk for further deterioration and high frequency of surveillance required.*    For questions or updates, please contact [facility] Please consult [email or web address] for contact info under        Signed, Bryan Lemmaavid Harding, MD  06/24/2019 4:12 AM

## 2019-06-24 NOTE — Progress Notes (Addendum)
Patient's sister, Auther Lyerly, called for update on patient. Identity verified by patient over the phone. Password set up as "[facility]". Update given and questions answered.   Patient would like for this RN to update his wife, Francie Massing, as well. Call attempted but voicemail reached.

## 2019-06-24 NOTE — Progress Notes (Signed)
Progress Note  Patient Name: Gregory Grant Date of Encounter: 06/24/2019  Primary Cardiologist: Dr. Glenetta Hew  Subjective   [name] had inferior STEMI treated with PCI and stenting earlier this morning by Dr. Ellyn Hack.  He is hemodynamically stable and pain-free  Inpatient Medications    Scheduled Meds: . aspirin  81 mg Oral Daily  . atorvastatin  80 mg Oral q1800  . bictegravir-emtricitabine-tenofovir AF  1 tablet Oral QPM  . [START ON 06/25/2019] heparin  5,000 Units Subcutaneous Q8H  . potassium chloride  40 mEq Oral Once  . sodium chloride flush  3 mL Intravenous Q12H  . ticagrelor  90 mg Oral BID   Continuous Infusions: . sodium chloride 10 mL/hr at 06/24/19 0346  . sodium chloride 100 mL/hr at 06/24/19 0800  . sodium chloride     PRN Meds: sodium chloride, acetaminophen, hydrALAZINE, labetalol, ondansetron (ZOFRAN) IV, sodium chloride flush   Vital Signs    Vitals:   06/24/19 0600 06/24/19 0700 06/24/19 0730 06/24/19 0800  BP: 127/84 113/89 136/82 133/83  Pulse: (!) 57 (!) 55 (!) 59 (!) 59  Resp: 17 13 19  (!) 22  Temp:   98 F (36.7 C)   TempSrc:   Oral   SpO2: 99% 97% 98% 98%  Weight:      Height:        Intake/Output Summary (Last 24 hours) at 06/24/2019 0811 Last data filed at 06/24/2019 0800 Gross per 24 hour  Intake 194.75 ml  Output 700 ml  Net -505.25 ml   Last 3 Weights 06/24/2019 12/27/2018 06/21/2018  Weight (lbs) 225 lb 169 lb 162 lb 12 oz  Weight (kg) 102.059 kg 76.658 kg 73.823 kg      Telemetry    Sinus rhythm with PVCs and short run of nonsustained ventricular tachycardia- Personally Reviewed  ECG    Normal sinus rhythm with inferior T wave inversion- Personally Reviewed  Physical Exam   GEN: No acute distress.   Neck: No JVD Cardiac: RRR, no murmurs, rubs, or gallops.  Respiratory: Clear to auscultation bilaterally. GI: Soft, nontender, non-distended  MS: No edema; No deformity. Neuro:  Nonfocal  Psych: Normal  affect   Labs    High Sensitivity Troponin:   Recent Labs  Lab 06/24/19 0432 06/24/19 0623  TROPONINIHS 52* 1,829*      Cardiac EnzymesNo results for input(s): TROPONINI in the last 168 hours. No results for input(s): TROPIPOC in the last 168 hours.   Chemistry Recent Labs  Lab 06/24/19 0432 06/24/19 0623  NA 137  --   K 3.7  --   CL 106  --   CO2 19*  --   GLUCOSE 93  --   BUN 12  --   CREATININE 1.08 1.00  CALCIUM 8.3*  --   PROT 6.5  --   ALBUMIN 3.7  --   AST 22  --   ALT 16  --   ALKPHOS 57  --   BILITOT 0.6  --   GFRNONAA >60 >60  GFRAA >60 >60  ANIONGAP 12  --      Hematology Recent Labs  Lab 06/24/19 0344 06/24/19 0432 06/24/19 0623  WBC 7.9 6.8 5.8  RBC 3.95* 4.00* 3.90*  HGB 13.7 13.6 13.3  HCT 40.1 39.6 39.1  MCV 101.5* 99.0 100.3*  MCH 34.7* 34.0 34.1*  MCHC 34.2 34.3 34.0  RDW 14.5 14.1 14.3  PLT 182 164 151    BNPNo results for input(s): BNP, PROBNP in  the last 168 hours.   DDimer No results for input(s): DDIMER in the last 168 hours.   Radiology    Dg Chest Port 1 View  Result Date: 06/24/2019 CLINICAL DATA:  Central chest pain at 2 a.m.  Vomiting. EXAM: PORTABLE CHEST 1 VIEW COMPARISON:  Two-view chest x-ray 12/21/2015 FINDINGS: The heart size and mediastinal contours are within normal limits. Both lungs are clear. The visualized skeletal structures are unremarkable. IMPRESSION: Negative one-view chest x-ray Electronically Signed   By: Marin Robertshristopher  Mattern M.D.   On: 06/24/2019 04:10    Cardiac Studies   Cardiac catheterization (06/24/2019)  SUMMARY  Severe two-vessel CAD: Culprit lesion mid RCA 100% with proximal RCA 80%, AV groove circumflex proximal 70% mid-distal 95%.  Successful DES PCI to both proximal and mid RCA lesions using Synergy DES stents (2.5 mm x 12 mm proximal and 2.5 mm x 3 mm distal -both postdilated to ~3.0 mm)  Hyperdynamic left ventricle with no obvious wall motion abnormality and relatively normal LVEDP   RECOMMENDATIONS  Admit ICU for close paced TIA/STEMI care.  Continue Aggrastat for 2 hours after Brilinta loaded in the Cath Lab  Follow troponin levels  Check 2D echocardiogram  High intensity statin  Was hypotensive in the Cath Lab, will therefore hold off on beta-blocker and ACE inhibitor/ARB evaluation until blood pressure stabilizes  pH 7.229 on ABG-1 amp bicarb ordered  We will need to review films with fellow interventional cardiologist to determine if the AV groove circumflex should be fixed prior to discharge or treated in a staged manner with outpatient PCI.    Gregory Lemmaavid Harding, MD   Intervention     Patient Profile     53 y.o. married African-American male with HIV but no prior cardiac history.  Risk factors include smoking.  He was symptomatic last night and was brought to the emergency room with EKG revealing inferior ST segment changes.  He was brought urgently to the Cath Lab by Dr. Herbie BaltimoreHarding revealing occluded dominant RCA, no significant disease and a L PLA with normal LV function.  He will underwent PCI and drug-eluting stenting x2 with the proximal and distal RCA successfully.  He was hypotensive and acidotic during the procedure.  Blood pressure has significantly improved and he is asymptomatic  Assessment & Plan    1: Inferior STEMI- inferior STEMI with initial troponins of 50 rising to 1000.  He has normal LV function.  He had an excellent angiographic result with stenting of the proximal and distal dominant RCA.  He has residual disease in the continuation of the AV groove into the L PLA branch.  His LAD and proximal circumflex are free of significant disease.  We will discuss with Dr. Herbie BaltimoreHarding plans for staged intervention as admission versus medical therapy.  He is on dual antiplatelet therapy including aspirin and Brilinta.  I suspect medication compliance will be an issue.  2: Hyperlipidemia- on high-dose statin therapy  3: Tobacco abuse- emphasized  importance of lifestyle modification and tobacco cessation  Patient is postop day 0 inferior STEMI.  He did have some nonsustained ventricular tachycardia on telemetry.  He is hemodynamically stable and improved since intervention.  We will keep in the unit today and most likely transfer out to telemetry tomorrow.  Cardiac rehab.  Plans for intervention of the circumflex still not yet been determined.  For questions or updates, please contact [facility] Please consult [email or web address] for contact info under        Signed, Nanetta BattyJonathan Krystal Delduca, MD  06/24/2019, 8:11 AM

## 2019-06-24 NOTE — ED Provider Notes (Signed)
[facility] Note   CSN: [id number] Arrival date & time: 06/24/19  [id number]     History   Chief Complaint Chief Complaint  Patient presents with  . Code STEMI    HPI Gregory Grant is a 53 y.o. male.     Level 5 caveat for acuity of condition.  Patient with history of HIV on antiretrovirals presenting with central chest pain rating to his left arm.  States this started around 2:00 while he was lying in bed resting.  It was associated shortness of breath, nausea, vomiting and diaphoresis.  EKG showed ST elevation inferiorly and ST depression in V2 and V3.  EMS activated code STEMI.  Patient denies cardiac history.  States he has had this pain in the past but attributed to acid reflux disease.  States he has a family history of heart disease but none personally.  Denies diabetes or hypertension.  Complains of pain in his chest going to his left arm.  There is no neck or abdominal pain or back pain.  He complains of some shortness of breath and tingling in his left arm.  He was given aspirin, nitroglycerin and morphine with partial relief.  The history is provided by the patient and the EMS personnel.    Past Medical History:  Diagnosis Date  . Coughing 02/2015  . GERD (gastroesophageal reflux disease)   . HIV (human immunodeficiency virus infection) [facility](HCC)     Patient Active Problem List   Diagnosis Date Noted  . Need for immunization against influenza 12/27/2018  . Medication monitoring encounter 06/21/2018  . Alcohol use 03/30/2018  . Right hip pain 03/30/2018  . Hyperlipidemia 03/30/2018  . Screening examination for venereal disease 12/22/2017  . Healthcare maintenance 12/22/2017  . Human immunodeficiency virus (HIV) disease (HCC) 04/13/2017  . Tobacco abuse counseling 04/13/2017  . Prediabetes 03/16/2015  . 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/14/2015    No past surgical history on file.      Home Medications    Prior to  Admission medications   Medication Sig Start Date End Date Taking? Authorizing Provider  atorvastatin (LIPITOR) 10 MG tablet Take 1 tablet (10 mg total) by mouth daily at 6 PM. IM PROGRAM Patient not taking: Reported on 06/21/2018 03/30/18   Scherrie GerlachHuang, Jennifer, MD  BIKTARVY 50-200-25 MG TABS tablet TAKE 1 TABLET BY MOUTH DAILY 12/29/18   Comer, Belia Hemanobert W, MD    Family History No family history on file.  Social History Social History   Tobacco Use  . Smoking status: Current Every Day Smoker    Packs/day: 1.00    Years: 30.00    Pack years: 30.00    Types: Cigarettes  . Smokeless tobacco: Never Used  Substance Use Topics  . Alcohol use: Yes    Alcohol/week: 1.0 standard drinks    Types: 1 Cans of beer per week  . Drug use: No     Allergies   Patient has no known allergies.   Review of Systems Review of Systems  Constitutional: Negative for activity change, appetite change and fever.  HENT: Negative for congestion.   Eyes: Negative for visual disturbance.  Respiratory: Positive for chest tightness and shortness of breath.   Cardiovascular: Positive for chest pain.  Gastrointestinal: Positive for nausea and vomiting. Negative for abdominal pain.  Genitourinary: Negative for dysuria and hematuria.  Musculoskeletal: Negative for arthralgias and myalgias.  Skin: Negative for rash.  Neurological: Negative for dizziness, weakness and headaches.    all  other systems are negative except as noted in the HPI and PMH.    Physical Exam Updated Vital Signs BP 127/84 (BP Location: Left Arm)   Pulse (!) 57   Temp 97.8 F (36.6 C) (Oral)   Resp 17   Ht 6' (1.829 m)   Wt 102.1 kg   SpO2 99%   BMI 30.52 kg/m   Physical Exam Vitals signs and nursing note reviewed.  Constitutional:      General: He is not in acute distress.    Appearance: He is well-developed. He is ill-appearing.  HENT:     Head: Normocephalic and atraumatic.     Mouth/Throat:     Pharynx: No oropharyngeal  exudate.  Eyes:     Conjunctiva/sclera: Conjunctivae normal.     Pupils: Pupils are equal, round, and reactive to light.  Neck:     Musculoskeletal: Normal range of motion and neck supple.     Comments: No meningismus. Cardiovascular:     Rate and Rhythm: Normal rate and regular rhythm.     Heart sounds: Normal heart sounds. No murmur.     Comments: Equal DP and PT pulses bilaterally, equal radial pulses bilaterally Pulmonary:     Effort: Pulmonary effort is normal. No respiratory distress.     Breath sounds: Normal breath sounds.  Chest:     Chest wall: No tenderness.  Abdominal:     Palpations: Abdomen is soft.     Tenderness: There is no abdominal tenderness. There is no guarding or rebound.  Musculoskeletal: Normal range of motion.        General: No tenderness.  Skin:    General: Skin is warm.     Capillary Refill: Capillary refill takes less than 2 seconds.  Neurological:     General: No focal deficit present.     Mental Status: He is alert and oriented to person, place, and time. Mental status is at baseline.     Cranial Nerves: No cranial nerve deficit.     Motor: No abnormal muscle tone.     Coordination: Coordination normal.     Comments: No ataxia on finger to nose bilaterally. No pronator drift. 5/5 strength throughout. CN 2-12 intact.Equal grip strength. Sensation intact.   Psychiatric:        Behavior: Behavior normal.      ED Treatments / Results  Labs (all labs ordered are listed, but only abnormal results are displayed) Labs Reviewed  CBC WITH DIFFERENTIAL/PLATELET - Abnormal; Notable for the following components:      Result Value   RBC 3.95 (*)    MCV 101.5 (*)    MCH 34.7 (*)    All other components within normal limits  COMPREHENSIVE METABOLIC PANEL - Abnormal; Notable for the following components:   CO2 19 (*)    Calcium 8.3 (*)    All other components within normal limits  LIPID PANEL - Abnormal; Notable for the following components:   HDL 38  (*)    LDL Cholesterol 120 (*)    All other components within normal limits  CBC - Abnormal; Notable for the following components:   RBC 4.00 (*)    All other components within normal limits  APTT - Abnormal; Notable for the following components:   aPTT 117 (*)    All other components within normal limits  TROPONIN I (HIGH SENSITIVITY) - Abnormal; Notable for the following components:   Troponin I (High Sensitivity) 52 (*)    All other components within normal  limits  SARS CORONAVIRUS 2 (HOSPITAL ORDER, PERFORMED IN [facility])  MRSA PCR SCREENING  PROTIME-INR  HEMOGLOBIN A1C  CBC  CREATININE, SERUM  TROPONIN I (HIGH SENSITIVITY)    EKG EKG Interpretation  Date/Time:  Friday June 24 2019 03:38:26 EDT Ventricular Rate:  66 PR Interval:    QRS Duration: 86 QT Interval:  403 QTC Calculation: 423 R Axis:   -6 Text Interpretation:  Sinus arrhythmia Abnormal R-wave progression, early transition Inferior infarct, acute (LCx) Lateral leads are also involved ST depression V1-V3, suggest recording posterior leads >>> Acute MI <<< ST elevation consider inferior injury or acute infarct septal ST depression Confirmed by Glynn Octaveancour, Jojo Pehl [phone]([id number]) on 06/24/2019 3:48:14 AM   Radiology Dg Chest Port 1 View  Result Date: 06/24/2019 CLINICAL DATA:  Central chest pain at 2 a.m.  Vomiting. EXAM: PORTABLE CHEST 1 VIEW COMPARISON:  Two-view chest x-ray 12/21/2015 FINDINGS: The heart size and mediastinal contours are within normal limits. Both lungs are clear. The visualized skeletal structures are unremarkable. IMPRESSION: Negative one-view chest x-ray Electronically Signed   By: Marin Robertshristopher  Mattern M.D.   On: 06/24/2019 04:10    Procedures Procedures (including critical care time)  Medications Ordered in ED Medications  0.9 %  sodium chloride infusion (has no administration in time range)  heparin injection 4,000 Units (has no administration in time range)     Initial  Impression / Assessment and Plan / ED Course  I have reviewed the triage vital signs and the nursing notes.  Pertinent labs & imaging results that were available during my care of the patient were reviewed by me and considered in my medical decision making (see chart for details).       Patient presents with chest pain and ST elevation inferiorly on EKG.  Code STEMI activated.  Patient given aspirin, nitroglycerin and morphine.  Equal upper upper extremity blood pressures bilaterally.  Low suspicion for PE or aortic dissection.  Seen at bedside with Dr. Herbie BaltimoreHarding of cardiology.  He will plan to take the patient to the Cath Lab emergently.  Airway and mental status remained stable.  CRITICAL CARE Performed by: Glynn OctaveStephen Ermias Tomeo Total critical care time: 32 minutes Critical care time was exclusive of separately billable procedures and treating other patients. Critical care was necessary to treat or prevent imminent or life-threatening deterioration. Critical care was time spent personally by me on the following activities: development of treatment plan with patient and/or surrogate as well as nursing, discussions with consultants, evaluation of patient's response to treatment, examination of patient, obtaining history from patient or surrogate, ordering and performing treatments and interventions, ordering and review of laboratory studies, ordering and review of radiographic studies, pulse oximetry and re-evaluation of patient's condition.  Final Clinical Impressions(s) / ED Diagnoses   Final diagnoses:  ST elevation myocardial infarction (STEMI), unspecified artery [facility](HCC)    ED Discharge Orders    None       Glynn Octaveancour, Neha Waight, MD 06/24/19 [phone]

## 2019-06-24 NOTE — ED Notes (Signed)
Bilateral pressures completed. Left arm 144/89, right arm 161/107

## 2019-06-24 NOTE — Progress Notes (Signed)
CRITICAL VALUE ALERT  Critical Value:  Troponin 1,829  Date & Time Notied:  06/24/2019 0815  Provider Notified: Gwenlyn Found, MD  Orders Received/Actions taken: none

## 2019-06-24 NOTE — Progress Notes (Signed)
CARDIAC REHAB PHASE I   PRE:  Rate/Rhythm: 41 SB  BP:  Supine: 157/72  Sitting:   Standing:    SaO2: 100%RA  MODE:  Ambulation: 370 ft   POST:  Rate/Rhythm: 67SR  BP:  Supine: 167/83  Sitting:    Standing:   SaO2: 100%RA 1415-1505 Pt walked 370 ft on RA with steady gait and tolerated well. No CP. MI education began  with pt who voiced understanding. Stressed importance of brilinta with stent. Has brilinta at bedside. Reviewed NTG use, heart healthy food choices and MI restrictions. Will complete ed tomorrow as pt ready to sleep. Assisted to bed after walk.Graylon Good, RN BSN  06/24/2019 3:03 PM

## 2019-06-24 NOTE — ED Triage Notes (Signed)
Pt coming from home after developing chest pain in the central area around 0200. Pt vomiting on scene for EMS and diaphoretic. Hx of HIV, no cardiac hx. Vitals 157/100, RR high, 100% on RA but pt placed on 3L [place] for comfort. EMS gave 4mg  Zofran, 324mg  ASA, 1 nitro (did not relieve any pain), 6mg  Morphine that decreased pain from 10 to 6, and a 500cc bolus.

## 2019-06-25 ENCOUNTER — Encounter (HOSPITAL_COMMUNITY): Payer: Self-pay

## 2019-06-25 DIAGNOSIS — Z716 Tobacco abuse counseling: Secondary | ICD-10-CM

## 2019-06-25 DIAGNOSIS — E785 Hyperlipidemia, unspecified: Secondary | ICD-10-CM

## 2019-06-25 LAB — HEMOGLOBIN A1C
Hgb A1c MFr Bld: 5.5 % (ref 4.8–5.6)
Mean Plasma Glucose: 111 mg/dL

## 2019-06-25 LAB — BASIC METABOLIC PANEL
Anion gap: 7 (ref 5–15)
BUN: 8 mg/dL (ref 6–20)
CO2: 21 mmol/L — ABNORMAL LOW (ref 22–32)
Calcium: 8.7 mg/dL — ABNORMAL LOW (ref 8.9–10.3)
Chloride: 108 mmol/L (ref 98–111)
Creatinine, Ser: 0.97 mg/dL (ref 0.61–1.24)
GFR calc Af Amer: >60 mL/min (ref >60)
GFR calc non Af Amer: >60 mL/min (ref >60)
Glucose, Bld: 101 mg/dL — ABNORMAL HIGH (ref 70–99)
Potassium: 4.1 mmol/L (ref 3.5–5.1)
Sodium: 136 mmol/L (ref 135–145)

## 2019-06-25 LAB — CBC
HCT: 40.7 % (ref 39.0–52.0)
Hemoglobin: 14.4 g/dL (ref 13.0–17.0)
MCH: 34.5 pg — ABNORMAL HIGH (ref 26.0–34.0)
MCHC: 35.4 g/dL (ref 30.0–36.0)
MCV: 97.6 fL (ref 80.0–100.0)
Platelets: 165 10*3/uL (ref 150–400)
RBC: 4.17 MIL/uL — ABNORMAL LOW (ref 4.22–5.81)
RDW: 13.9 % (ref 11.5–15.5)
WBC: 7.3 10*3/uL (ref 4.0–10.5)
nRBC: 0 % (ref 0.0–0.2)

## 2019-06-25 MED ORDER — TICAGRELOR 90 MG PO TABS: 90.0000 mg | ORAL_TABLET | Freq: Two times a day (BID) | ORAL | 4 refills | Status: DC

## 2019-06-25 MED ORDER — ATORVASTATIN CALCIUM 80 MG PO TABS: 80.0000 mg | ORAL_TABLET | Freq: Every day | ORAL | 3 refills | Status: DC

## 2019-06-25 MED ORDER — ASPIRIN 81 MG PO CHEW: 81.0000 mg | CHEWABLE_TABLET | Freq: Every day | ORAL | Status: DC

## 2019-06-25 NOTE — Progress Notes (Signed)
CARDIAC REHAB PHASE I   PRE:  Rate/Rhythm: 67 SR  BP:  Supine: 141/83  Sitting:   Standing:    SaO2: 100 RA  MODE:  Ambulation: 740 ft   POST:  Rate/Rhythm: 69 SR  BP:  Supine: 145/85  Sitting:   Standing:    SaO2: 100 RA 0850-0920 Pt tolerated ambulation well without c/o of cp or SOB.  VS stable Pt back to bed after walk with call light in reach. Completed MI education with pt. Discussed exercise guidelines, smoking cessation and Outpt. CRP with pt. He voices understanding. Will send referral to Outpt. CRP in [place]. He understands that he will not get enrolled in the Outpt program until he has has second PCI. Pt seems motivated to quit smoking, I gave his cessation information. Reinforced importance of taking Brilinta and proper use of sl NTG. He voices understanding.  Rodney Langton RN 06/25/2019 9:16 AM

## 2019-06-25 NOTE — Discharge Summary (Addendum)
Discharge Summary    Patient ID: Gregory Grant MRN: [id number]; DOB: 01-Oct-1966  Admit date: 06/24/2019 Discharge date: 06/25/2019  Primary Care Provider: Patient, No Pcp Per  Primary Cardiologist: Dr. Bryan Lemmaavid Harding, MD   Discharge Diagnoses    Principal Problem:   Acute ST elevation myocardial infarction (STEMI) of inferolateral wall (HCC) Active Problems:   Human immunodeficiency virus (HIV) disease (HCC)   Tobacco abuse counseling   Hyperlipidemia with target low density lipoprotein (LDL) cholesterol less than 70 mg/dL   Coronary artery disease involving native coronary artery of native heart with unstable angina pectoris (HCC)   Acute ST elevation myocardial infarction (STEMI) involving right coronary artery (HCC)  Allergies No Known Allergies  Diagnostic Studies/Procedures    Cardiac catheterization (06/24/2019)  SUMMARY  Severe two-vessel CAD: Culprit lesion mid RCA 100% with proximal RCA 80%, AV groove circumflex proximal 70% mid-distal 95%.  Successful DES PCI to both proximal and mid RCA lesions using Synergy DES stents (2.5 mm x 12 mm proximal and 2.5 mm x 3 mm distal -both postdilated to ~3.0 mm)  Hyperdynamic left ventricle with no obvious wall motion abnormality and relatively normal LVEDP  RECOMMENDATIONS  Admit ICU for close paced TIA/STEMI care.  Continue Aggrastat for 2 hours after Brilinta loaded in the Cath Lab  Follow troponin levels  Check 2D echocardiogram  High intensity statin  Was hypotensive in the Cath Lab, will therefore hold off on beta-blocker and ACE inhibitor/ARB evaluation until blood pressure stabilizes  pH 7.229 on ABG-1 amp bicarb ordered  We will need to review films with fellow interventional cardiologist to determine if the AV groove circumflex should be fixed prior to discharge or treated in a staged manner with outpatient PCI.    Bryan Lemmaavid Harding, MD   Intervention     2 D echo (06/24/19)  IMPRESSIONS    1. The left ventricle has normal systolic function with an ejection fraction of 60-65%. The cavity size was normal. Left ventricular diastolic parameters were normal. 2. The right ventricle has normal systolic function. The cavity was normal. There is no increase in right ventricular wall thickness. 3. Trivial pericardial effusion is present. 4. The aortic valve is tricuspid. Mild thickening of the aortic valve. 5. The aortic root is normal in size and structure.  History of Present Illness    [name] is a 53yo male with a hx of well controlled HIV but no prior cardiac history however with cardiac risk factors that include tobacco use who [facility] with chest pain an evolutionary changes showing ST elevation on EKG consistent with Code STEMI.   Hospital Course     [name] was in his usual state of health until two hours prior to hospital presentation vis EMS. He reports new onset central chest pain with radiation to his jaw and hands. He described the pain as intermittent that progressed to constant chest pain. He called EMS for assistance to the ED.   On their arrival EKG showed flat, up sloping ST segment elevations in leads II, III and AVF as well as elevations in V5-V6 with 6/10 chest pain. He was given 6mg  Morphine and one SL NTG with come relief.   He was taken to the cath lab for emergent cath on 06/24/2019 that showed severe two vessel CAD with culprit lesion of mRCA at 100% stenosis and a pRCA at 80% stenosis. There is significant AV groove into the left PLA branch disease. A successful DES/PCIO to both proximal and  mid RCA lesions was performed without complication. LAD and LCx are free of sigmnificant disease. Per cath report, he will need films reviewed with interventionalist to determine in the AV groove LCX lesion should be fixed prior to discharge or as a staged intervention. Ultimately this was determined to be a staged intervention case. He  will be seen for close follow up, then to be seen by Dr. Herbie BaltimoreHarding in 3-4 weeks who will make the final determination. Echocardiogram performed 06/24/2019 with normal LV function, no valvular disease or diastolic dysfunction.   Hospital problems include:  -Inferior STEMI: Troponin levels peaked at 18,593. Per cath note, he had excellent angiographic result with stenting of the proximal and distal dominant RCA.  He has residual disease in the continuation of the AV groove into the L PLA branch. His LAD and proximal circumflex are free of significant disease. Case discussed with Dr. Allyson SabalBerry and Dr Herbie BaltimoreHarding with the concensus opinion to perform a staged PCI in several weeks. He is on dual antiplatelet therapy including aspirin and Brilinta.   -Labs are stable in day of discharge  -Continue low dose metoprolol 12.5mg  BID   -Hyperlipidemia: -LDL, on high-dose statin therapy -Continue high intensity statin  -Would recheck Lipid panel and LFTs in 6-8 weeks  -Tobacco abuse: -Emphasized importance of lifestyle modification and tobacco cessation    Consultants: None   The patient was seen and examined by Dr. Allyson SabalBerry who feels that he is stable and ready for discharge today, 06/25/2019. Cath site unremarkable and patient has ambulated with cardiac rehabilitation without complication.  _____________  Discharge Vitals Blood pressure (!) 154/96, pulse (!) 52, temperature 98.8 F (37.1 C), temperature source Oral, resp. rate 16, height 6' (1.829 m), weight 72.7 kg, SpO2 99 %.  Filed Weights   06/24/19 0342 06/25/19 0600  Weight: 102.1 kg 72.7 kg   Labs & Radiologic Studies    CBC Recent Labs    06/24/19 0344  06/24/19 0623 06/25/19 0251  WBC 7.9   < > 5.8 7.3  NEUTROABS 4.1  --   --   --   HGB 13.7   < > 13.3 14.4  HCT 40.1   < > 39.1 40.7  MCV 101.5*   < > 100.3* 97.6  PLT 182   < > 151 165   < > = values in this interval not displayed.   Basic Metabolic Panel Recent Labs    16/09/9606/10/20 0432   06/24/19 0442 06/24/19 0515 06/24/19 0623 06/25/19 0251  NA 137   < > 137 127*  --  136  K 3.7   < > 3.7 3.4*  --  4.1  CL 106  --  104  --   --  108  CO2 19*  --   --   --   --  21*  GLUCOSE 93  --  97  --   --  101*  BUN 12  --  11  --   --  8  CREATININE 1.08   < > 1.10  --  1.00 0.97  CALCIUM 8.3*  --   --   --   --  8.7*  MG  --   --   --   --  1.8  --    < > = values in this interval not displayed.   Liver Function Tests Recent Labs    06/24/19 0432  AST 22  ALT 16  ALKPHOS 57  BILITOT 0.6  PROT 6.5  ALBUMIN 3.7  Hemoglobin A1C Recent Labs    06/24/19 0432  HGBA1C 5.5   Fasting Lipid Panel Recent Labs    06/24/19 0432  CHOL 169  HDL 38*  LDLCALC 120*  TRIG 56  CHOLHDL 4.4   _____________  Dg Chest Port 1 View  Result Date: 06/24/2019 CLINICAL DATA:  Central chest pain at 2 a.m.  Vomiting. EXAM: PORTABLE CHEST 1 VIEW COMPARISON:  Two-view chest x-ray 12/21/2015 FINDINGS: The heart size and mediastinal contours are within normal limits. Both lungs are clear. The visualized skeletal structures are unremarkable. IMPRESSION: Negative one-view chest x-ray Electronically Signed   By: Marin Robertshristopher  Mattern M.D.   On: 06/24/2019 04:10   Disposition   Pt is being discharged home today in good condition.  Follow-up Plans & Appointments   Follow-up Information    [facility] Call.   Specialty: Family Medicine Why: to schedule a hospital follow-up appointment and to establish a primary care provider Contact information: [address] [place] [id number] [phone]       [facility] Follow up.   Specialty: Cardiology Why: Expect an upcoming phone call regarding your appointments.  Expect these to be within the next 7 to 10 days and with Dr. Herbie BaltimoreHarding in the next 3 to 4 weeks. Contact information: [address] [place] [id number] [phone]         Discharge  Instructions    AMB Referral to Cardiac Rehabilitation - Phase II   Complete by: As directed    Diagnosis:  Coronary Stents STEMI     After initial evaluation and assessments completed: Virtual Based Care may be provided alone or in conjunction with Phase 2 Cardiac Rehab based on patient barriers.: Yes   Call MD for:  difficulty breathing, headache or visual disturbances   Complete by: As directed    Call MD for:  extreme fatigue   Complete by: As directed    Call MD for:  hives   Complete by: As directed    Call MD for:  persistant dizziness or light-headedness   Complete by: As directed    Call MD for:  persistant nausea and vomiting   Complete by: As directed    Call MD for:  redness, tenderness, or signs of infection (pain, swelling, redness, odor or green/yellow discharge around incision site)   Complete by: As directed    Call MD for:  severe uncontrolled pain   Complete by: As directed    Call MD for:  temperature >100.4   Complete by: As directed    Diet - low sodium heart healthy   Complete by: As directed    Discharge instructions   Complete by: As directed    No driving for 3 days. No lifting over 5 lbs for 1 week. No sexual activity for 1 week. Keep procedure site clean & dry. If you notice increased pain, swelling, bleeding or pus, call/return!  You may shower, but no soaking baths/hot tubs/pools for 1 week.   PLEASE DO NOT MISS ANY DOSES OF YOUR BRILINTA!!!!! Also keep a log of you blood pressures and bring back to your follow up appt. Please call the office with any questions.   Patients taking blood thinners should generally stay away from medicines like ibuprofen, Advil, Motrin, naproxen, and Aleve due to risk of stomach bleeding. You may take Tylenol as directed or talk to your primary doctor about alternatives.  Some studies suggest Prilosec/Omeprazole interacts with Plavix.  If you have reflux problems, please use Protonix for less chance of interaction.  Please  be expecting a call from our office regarding your follow up appointments. Expect that you will have one in the next 7-10 days then with Dr. Ellyn Hack in the next 3-4 weeks.   Increase activity slowly   Complete by: As directed      Discharge Medications   Allergies as of 06/25/2019   No Known Allergies     Medication List    TAKE these medications   aspirin 81 MG chewable tablet Chew 1 tablet (81 mg total) by mouth daily.   atorvastatin 80 MG tablet Commonly known as: LIPITOR Take 1 tablet (80 mg total) by mouth daily at 6 PM. What changed:   medication strength  how much to take  additional instructions   Biktarvy 50-200-25 MG Tabs tablet Generic drug: bictegravir-emtricitabine-tenofovir AF TAKE 1 TABLET BY MOUTH DAILY   ticagrelor 90 MG Tabs tablet Commonly known as: BRILINTA Take 1 tablet (90 mg total) by mouth 2 (two) times daily.        Acute coronary syndrome (MI, NSTEMI, STEMI, etc) this admission?: Yes.     AHA/ACC Clinical Performance & Quality Measures: 1. Aspirin prescribed? - Yes 2. ADP Receptor Inhibitor (Plavix/Clopidogrel, Brilinta/Ticagrelor or Effient/Prasugrel) prescribed (includes medically managed patients)? - Yes 3. Beta Blocker prescribed? - Yes 4. High Intensity Statin (Lipitor 40-80mg  or Crestor 20-40mg ) prescribed? - Yes 5. EF assessed during THIS hospitalization? - Yes 6. For EF <40%, was ACEI/ARB prescribed? - Not Applicable (EF >/= 16%) 7. For EF <40%, Aldosterone Antagonist (Spironolactone or Eplerenone) prescribed? - Not Applicable (EF >/= 07%) 8. Cardiac Rehab Phase II ordered (Included Medically managed Patients)? - Yes    Outstanding Labs/Studies   Lipid and LFTs   Duration of Discharge Encounter   Greater than 30 minutes including physician time.  Signed, Kathyrn Drown, NP 06/25/2019, 9:20 AM  Agree with note by Kathyrn Drown NP  Postop day 1 inferior STEMI with residual circumflex disease and normal LV function.  He is  on dual antiplatelet therapy.  He is asymptomatic.  Exam is benign.  Is ambulated around the unit without difficulty or symptoms.  Plan discharge home this morning, TOC 7, follow-up with Dr. Ellyn Hack in 2 to 3 weeks with consideration for staged circumflex intervention.    Lorretta Harp, M.D., [place], [facility], Laverta Baltimore [facility] [address] [place], [place]  [address]  [phone] 06/25/2019 10:14 AM

## 2019-06-25 NOTE — Progress Notes (Signed)
Progress Note  Patient Name: Madelon LipsJames Lazar Date of Encounter: 06/25/2019  Primary Cardiologist: Dr. Bryan Lemmaavid Harding  Subjective   Mr. Kyla BalzarineCarmichael had inferior STEMI treated with PCI and stenting yesterday  morning by Dr. Herbie BaltimoreHarding.  He is hemodynamically stable and pain-free  Inpatient Medications    Scheduled Meds: . aspirin  81 mg Oral Daily  . atorvastatin  80 mg Oral q1800  . bictegravir-emtricitabine-tenofovir AF  1 tablet Oral QPM  . Chlorhexidine Gluconate Cloth  6 each Topical Daily  . heparin  5,000 Units Subcutaneous Q8H  . sodium chloride flush  3 mL Intravenous Q12H  . ticagrelor  90 mg Oral BID   Continuous Infusions: . sodium chloride 10 mL/hr at 06/24/19 0346  . sodium chloride     PRN Meds: sodium chloride, acetaminophen, ondansetron (ZOFRAN) IV, sodium chloride flush   Vital Signs    Vitals:   06/25/19 0600 06/25/19 0700 06/25/19 0731 06/25/19 0800  BP: 119/64 (!) 140/97  (!) 137/94  Pulse: (!) 51 69  (!) 58  Resp: 14 (!) 21  17  Temp:   98.8 F (37.1 C)   TempSrc:   Oral   SpO2: 98% 97%  99%  Weight: 72.7 kg     Height:        Intake/Output Summary (Last 24 hours) at 06/25/2019 0817 Last data filed at 06/25/2019 0700 Gross per 24 hour  Intake 523 ml  Output 3975 ml  Net -3452 ml   Last 3 Weights 06/25/2019 06/24/2019 12/27/2018  Weight (lbs) 160 lb 4.4 oz 225 lb 169 lb  Weight (kg) 72.7 kg 102.059 kg 76.658 kg      Telemetry    NSR  Personally Reviewed  ECG    SB 55 with inferior TWI and early R wave transition - Personally Reviewed  Physical Exam   GEN: No acute distress.   Neck: No JVD Cardiac: RRR, no murmurs, rubs, or gallops.  Respiratory: Clear to auscultation bilaterally. GI: Soft, nontender, non-distended  MS: No edema; No deformity. Neuro:  Nonfocal  Psych: Normal affect   Labs    High Sensitivity Troponin:   Recent Labs  Lab 06/24/19 0432 06/24/19 0623 06/24/19 0930 06/24/19 1404  TROPONINIHS 52* 1,829*  17,034* 18,593*      Cardiac EnzymesNo results for input(s): TROPONINI in the last 168 hours. No results for input(s): TROPIPOC in the last 168 hours.   Chemistry Recent Labs  Lab 06/24/19 0432  06/24/19 0442 06/24/19 0515 06/24/19 0623 06/25/19 0251  NA 137   < > 137 127*  --  136  K 3.7   < > 3.7 3.4*  --  4.1  CL 106  --  104  --   --  108  CO2 19*  --   --   --   --  21*  GLUCOSE 93  --  97  --   --  101*  BUN 12  --  11  --   --  8  CREATININE 1.08   < > 1.10  --  1.00 0.97  CALCIUM 8.3*  --   --   --   --  8.7*  PROT 6.5  --   --   --   --   --   ALBUMIN 3.7  --   --   --   --   --   AST 22  --   --   --   --   --   ALT 16  --   --   --   --   --  ALKPHOS 57  --   --   --   --   --   BILITOT 0.6  --   --   --   --   --   GFRNONAA >60  --   --   --  >60 >60  GFRAA >60  --   --   --  >60 >60  ANIONGAP 12  --   --   --   --  7   < > = values in this interval not displayed.     Hematology Recent Labs  Lab 06/24/19 0432  06/24/19 0515 06/24/19 0623 06/25/19 0251  WBC 6.8  --   --  5.8 7.3  RBC 4.00*  --   --  3.90* 4.17*  HGB 13.6   < > 13.3 13.3 14.4  HCT 39.6   < > 39.0 39.1 40.7  MCV 99.0  --   --  100.3* 97.6  MCH 34.0  --   --  34.1* 34.5*  MCHC 34.3  --   --  34.0 35.4  RDW 14.1  --   --  14.3 13.9  PLT 164  --   --  151 165   < > = values in this interval not displayed.    BNPNo results for input(s): BNP, PROBNP in the last 168 hours.   DDimer No results for input(s): DDIMER in the last 168 hours.   Radiology    Dg Chest Port 1 View  Result Date: 06/24/2019 CLINICAL DATA:  Central chest pain at 2 a.m.  Vomiting. EXAM: PORTABLE CHEST 1 VIEW COMPARISON:  Two-view chest x-ray 12/21/2015 FINDINGS: The heart size and mediastinal contours are within normal limits. Both lungs are clear. The visualized skeletal structures are unremarkable. IMPRESSION: Negative one-view chest x-ray Electronically Signed   By: San Morelle M.D.   On: 06/24/2019 04:10     Cardiac Studies   Cardiac catheterization (06/24/2019)  SUMMARY  Severe two-vessel CAD: Culprit lesion mid RCA 100% with proximal RCA 80%, AV groove circumflex proximal 70% mid-distal 95%.  Successful DES PCI to both proximal and mid RCA lesions using Synergy DES stents (2.5 mm x 12 mm proximal and 2.5 mm x 3 mm distal -both postdilated to ~3.0 mm)  Hyperdynamic left ventricle with no obvious wall motion abnormality and relatively normal LVEDP  RECOMMENDATIONS  Admit ICU for close paced TIA/STEMI care.  Continue Aggrastat for 2 hours after Brilinta loaded in the Cath Lab  Follow troponin levels  Check 2D echocardiogram  High intensity statin  Was hypotensive in the Cath Lab, will therefore hold off on beta-blocker and ACE inhibitor/ARB evaluation until blood pressure stabilizes  pH 7.229 on ABG-1 amp bicarb ordered  We will need to review films with fellow interventional cardiologist to determine if the AV groove circumflex should be fixed prior to discharge or treated in a staged manner with outpatient PCI.    Glenetta Hew, MD   Intervention     2 D echo (06/24/19)  IMPRESSIONS    1. The left ventricle has normal systolic function with an ejection fraction of 60-65%. The cavity size was normal. Left ventricular diastolic parameters were normal.  2. The right ventricle has normal systolic function. The cavity was normal. There is no increase in right ventricular wall thickness.  3. Trivial pericardial effusion is present.  4. The aortic valve is tricuspid. Mild thickening of the aortic valve.  5. The aortic root is normal in size and structure.   Patient  Profile     53 y.o. married African-American male with HIV but no prior cardiac history.  Risk factors include smoking.  He was symptomatic last night and was brought to the emergency room with EKG revealing inferior ST segment changes.  He was brought urgently to the Cath Lab by Dr. Herbie BaltimoreHarding revealing  occluded dominant RCA, no significant disease and a L PLA with normal LV function.  He will underwent PCI and drug-eluting stenting x2 with the proximal and distal RCA successfully.  He was hypotensive and acidotic during the procedure.  Blood pressure has significantly improved and he is asymptomatic  Assessment & Plan    1: Inferior STEMI- inferior STEMI with initial troponins of 18,593   He has normal LV function.  He had an excellent angiographic result with stenting of the proximal and distal dominant RCA.  He has residual disease in the continuation of the AV groove into the L PLA branch.  His LAD and proximal circumflex are free of significant disease.  I have discussed with Dr Herbie BaltimoreHarding and the concensus opinion is staged PCI in several weeks.   He is on dual antiplatelet therapy including aspirin and Brilinta.  I suspect medication compliance will be an issue.  2: Hyperlipidemia- on high-dose statin therapy  3: Tobacco abuse- emphasized importance of lifestyle modification and tobacco cessation  Patient is postop day 1 inferior STEMI. He has remained stable over the last 24 hrs. Ambulated in the hall w/o difficulty. No CP. Stable for DC home on DAPT, high dose statin Rx and low dose metop (12.5 mg PO BID). TOC7 then ROV with Herbie BaltimoreHarding several weeks. Plan staged PCI distal AVG LCX after that    For questions or updates, please contact [facility] Please consult [email or web address] for contact info under        Signed, Nanetta BattyJonathan Aimee Timmons, MD  06/25/2019, 8:17 AM

## 2019-06-26 ENCOUNTER — Other Ambulatory Visit: Payer: Self-pay | Admitting: Internal Medicine

## 2019-06-26 DIAGNOSIS — B20 Human immunodeficiency virus [HIV] disease: Secondary | ICD-10-CM

## 2019-06-27 ENCOUNTER — Ambulatory Visit: Payer: Self-pay

## 2019-06-27 ENCOUNTER — Other Ambulatory Visit: Payer: Self-pay

## 2019-06-27 ENCOUNTER — Encounter: Payer: Self-pay | Admitting: Internal Medicine

## 2019-06-27 ENCOUNTER — Ambulatory Visit (INDEPENDENT_AMBULATORY_CARE_PROVIDER_SITE_OTHER): Payer: Self-pay | Admitting: Internal Medicine

## 2019-06-27 ENCOUNTER — Telehealth: Payer: Self-pay | Admitting: Cardiology

## 2019-06-27 VITALS — Temp 98.7°F | Wt 166.0 lb

## 2019-06-27 DIAGNOSIS — I2111 ST elevation (STEMI) myocardial infarction involving right coronary artery: Secondary | ICD-10-CM

## 2019-06-27 DIAGNOSIS — Z113 Encounter for screening for infections with a predominantly sexual mode of transmission: Secondary | ICD-10-CM

## 2019-06-27 DIAGNOSIS — Z5181 Encounter for therapeutic drug level monitoring: Secondary | ICD-10-CM

## 2019-06-27 DIAGNOSIS — Z23 Encounter for immunization: Secondary | ICD-10-CM

## 2019-06-27 DIAGNOSIS — Z716 Tobacco abuse counseling: Secondary | ICD-10-CM

## 2019-06-27 DIAGNOSIS — B20 Human immunodeficiency virus [HIV] disease: Secondary | ICD-10-CM

## 2019-06-27 NOTE — Assessment & Plan Note (Signed)
I discussed how important smoking cessation is now more than ever.  He is motivated to quit though has not yet.  He is cutting down fortunately.

## 2019-06-27 NOTE — Assessment & Plan Note (Signed)
Screened negative 

## 2019-06-27 NOTE — Assessment & Plan Note (Signed)
No current chest pain.  He is aware of the need to follow up with Dr. Ellyn Hack in 3-4 weeks.

## 2019-06-27 NOTE — Assessment & Plan Note (Signed)
Creat has remained good.

## 2019-06-27 NOTE — Addendum Note (Signed)
Addended by: Aundria Rud on: 06/27/2019 03:01 PM   Modules accepted: Orders

## 2019-06-27 NOTE — Telephone Encounter (Signed)
LVM for patient to call and schedule 10 day TOC appointment with Dr. Darcus Pester APP team and also a followup appointment with Dr. Ellyn Hack in 3 to 4 weeks.

## 2019-06-27 NOTE — Progress Notes (Signed)
   Subjective:    Patient ID: Gregory Grant, male    DOB: 08-Dec-1966, 53 y.o.   MRN: [id number]  HPI Here for follow up of HIV. Continues on Biktarvy and no missed doses. CD4 1280 and viral load < 20.  No associated n/v/d.  No rashes.  Just out of the hospital for an RCA 100% and 80 % stenosis requiring PCI.  Also due for further PCI next month.  Done by Dr. Ellyn Hack.  Patient is still smoking but knows how important it is to quit.     Review of Systems  Constitutional: Negative for fatigue.  Gastrointestinal: Negative for abdominal pain, diarrhea and nausea.  Skin: Negative for rash.       Objective:   Physical Exam Constitutional:      General: He is not in acute distress.    Appearance: He is well-developed.  HENT:     Mouth/Throat:     Pharynx: No oropharyngeal exudate.  Eyes:     General: No scleral icterus. Cardiovascular:     Rate and Rhythm: Normal rate and regular rhythm.     Heart sounds: Normal heart sounds. No murmur.  Pulmonary:     Effort: Pulmonary effort is normal. No respiratory distress.     Breath sounds: Normal breath sounds.  Skin:    Findings: No rash.    SH: + tobacco is more motivated to quit       Assessment & Plan:

## 2019-06-28 ENCOUNTER — Telehealth (HOSPITAL_COMMUNITY): Payer: Self-pay

## 2019-06-28 NOTE — Telephone Encounter (Signed)
Called and spoke with pt in regards to CR, pt stated he does not have insurance at this time. And he is waiting for Dr. Ellyn Hack office to give him a call to schedule his follow up appt. Informed pt about our Virtual Cardiac Rehab, will follow later for follow up appt.  Placed pt card in black box on Support Rep desk.

## 2019-07-21 ENCOUNTER — Encounter: Payer: Self-pay | Admitting: Internal Medicine

## 2019-12-09 ENCOUNTER — Other Ambulatory Visit: Payer: Self-pay | Admitting: Internal Medicine

## 2019-12-09 DIAGNOSIS — B20 Human immunodeficiency virus [HIV] disease: Secondary | ICD-10-CM

## 2019-12-28 ENCOUNTER — Other Ambulatory Visit: Payer: Self-pay

## 2019-12-28 DIAGNOSIS — B20 Human immunodeficiency virus [HIV] disease: Secondary | ICD-10-CM

## 2019-12-29 LAB — T-HELPER CELL (CD4) - (RCID CLINIC ONLY)
CD4 % Helper T Cell: 54 % (ref 33–65)
CD4 T Cell Abs: 1092 /uL (ref 400–1790)

## 2019-12-31 LAB — HIV-1 RNA QUANT-NO REFLEX-BLD
HIV 1 RNA Quant: <20 copies/mL
HIV-1 RNA Quant, Log: <1.3 Log copies/mL

## 2020-01-12 ENCOUNTER — Encounter: Payer: Self-pay | Admitting: Internal Medicine

## 2020-01-16 ENCOUNTER — Encounter: Payer: Self-pay | Admitting: Internal Medicine

## 2020-01-16 ENCOUNTER — Other Ambulatory Visit: Payer: Self-pay

## 2020-01-16 ENCOUNTER — Ambulatory Visit (INDEPENDENT_AMBULATORY_CARE_PROVIDER_SITE_OTHER): Payer: Self-pay | Admitting: Internal Medicine

## 2020-01-16 VITALS — BP 145/87 | HR 98 | Temp 98.1°F | Ht 68.5 in | Wt 166.0 lb

## 2020-01-16 DIAGNOSIS — B20 Human immunodeficiency virus [HIV] disease: Secondary | ICD-10-CM

## 2020-01-16 DIAGNOSIS — Z113 Encounter for screening for infections with a predominantly sexual mode of transmission: Secondary | ICD-10-CM

## 2020-01-16 DIAGNOSIS — Z716 Tobacco abuse counseling: Secondary | ICD-10-CM

## 2020-01-16 NOTE — Progress Notes (Signed)
   Subjective:    Patient ID: Gregory Grant, male    DOB: 07-Mar-1966, 54 y.o.   MRN: [id number]  HPI Here for follow up of HIV. He continues on Biktarvy and no missed doses.  CD4 of 1092 and viral load remains < 20.  No new issues.  Had flu shot with labs.     Review of Systems  Gastrointestinal: Negative for diarrhea and nausea.  Skin: Negative for rash.       Objective:   Physical Exam Constitutional:      General: He is not in acute distress.    Appearance: He is well-developed.  Eyes:     General: No scleral icterus. Cardiovascular:     Rate and Rhythm: Normal rate and regular rhythm.     Heart sounds: Normal heart sounds. No murmur.  Pulmonary:     Effort: Pulmonary effort is normal. No respiratory distress.     Breath sounds: Normal breath sounds.  Skin:    Findings: No rash.  Neurological:     General: No focal deficit present.    SH: + tobacco is more motivated to quit       Assessment & Plan:

## 2020-01-16 NOTE — Assessment & Plan Note (Signed)
c ounseled on quitting. He remains precontemplative

## 2020-01-16 NOTE — Assessment & Plan Note (Signed)
Doing well, no changes.  rtc 6 months.  

## 2020-03-28 ENCOUNTER — Other Ambulatory Visit: Payer: Self-pay | Admitting: Internal Medicine

## 2020-03-28 DIAGNOSIS — B20 Human immunodeficiency virus [HIV] disease: Secondary | ICD-10-CM

## 2020-06-23 ENCOUNTER — Other Ambulatory Visit: Payer: Self-pay | Admitting: Cardiology

## 2020-06-25 NOTE — Telephone Encounter (Signed)
Pt was last seen by Dr. Herbie Baltimore, please advise.

## 2020-06-29 ENCOUNTER — Encounter: Payer: Self-pay | Admitting: Internal Medicine

## 2020-07-16 ENCOUNTER — Other Ambulatory Visit: Payer: Self-pay

## 2020-07-16 DIAGNOSIS — B20 Human immunodeficiency virus [HIV] disease: Secondary | ICD-10-CM

## 2020-07-16 DIAGNOSIS — Z113 Encounter for screening for infections with a predominantly sexual mode of transmission: Secondary | ICD-10-CM

## 2020-07-17 LAB — URINE CYTOLOGY ANCILLARY ONLY
Chlamydia: NEGATIVE
Comment: NEGATIVE
Comment: NORMAL
Neisseria Gonorrhea: NEGATIVE

## 2020-07-17 LAB — T-HELPER CELL (CD4) - (RCID CLINIC ONLY)
CD4 % Helper T Cell: 47 % (ref 33–65)
CD4 T Cell Abs: 994 /uL (ref 400–1790)

## 2020-07-20 LAB — COMPLETE METABOLIC PANEL WITH GFR
AG Ratio: 1.5 (calc) (ref 1.0–2.5)
ALT: 17 U/L (ref 9–46)
AST: 28 U/L (ref 10–35)
Albumin: 4.1 g/dL (ref 3.6–5.1)
Alkaline phosphatase (APISO): 87 U/L (ref 35–144)
BUN: 7 mg/dL (ref 7–25)
CO2: 23 mmol/L (ref 20–32)
Calcium: 9.7 mg/dL (ref 8.6–10.3)
Chloride: 104 mmol/L (ref 98–110)
Creat: 1 mg/dL (ref 0.70–1.33)
GFR, Est African American: 100 mL/min/{1.73_m2} (ref >=60)
GFR, Est Non African American: 86 mL/min/{1.73_m2} (ref >=60)
Globulin: 2.8 g/dL (calc) (ref 1.9–3.7)
Glucose, Bld: 105 mg/dL — ABNORMAL HIGH (ref 65–99)
Potassium: 4.2 mmol/L (ref 3.5–5.3)
Sodium: 138 mmol/L (ref 135–146)
Total Bilirubin: 0.6 mg/dL (ref 0.2–1.2)
Total Protein: 6.9 g/dL (ref 6.1–8.1)

## 2020-07-20 LAB — CBC WITH DIFFERENTIAL/PLATELET
Absolute Monocytes: 645 cells/uL (ref 200–950)
Basophils Absolute: 52 cells/uL (ref 0–200)
Basophils Relative: 0.6 %
Eosinophils Absolute: 77 cells/uL (ref 15–500)
Eosinophils Relative: 0.9 %
HCT: 45.5 % (ref 38.5–50.0)
Hemoglobin: 15.7 g/dL (ref 13.2–17.1)
Lymphs Abs: 2184 cells/uL (ref 850–3900)
MCH: 34.9 pg — ABNORMAL HIGH (ref 27.0–33.0)
MCHC: 34.5 g/dL (ref 32.0–36.0)
MCV: 101.1 fL — ABNORMAL HIGH (ref 80.0–100.0)
MPV: 10.8 fL (ref 7.5–12.5)
Monocytes Relative: 7.5 %
Neutro Abs: 5642 cells/uL (ref 1500–7800)
Neutrophils Relative %: 65.6 %
Platelets: 285 10*3/uL (ref 140–400)
RBC: 4.5 10*6/uL (ref 4.20–5.80)
RDW: 13.4 % (ref 11.0–15.0)
Total Lymphocyte: 25.4 %
WBC: 8.6 10*3/uL (ref 3.8–10.8)

## 2020-07-20 LAB — HIV-1 RNA QUANT-NO REFLEX-BLD
HIV 1 RNA Quant: <20 Copies/mL
HIV-1 RNA Quant, Log: <1.3 Log cps/mL

## 2020-07-20 LAB — RPR: RPR Ser Ql: NONREACTIVE

## 2020-07-22 ENCOUNTER — Other Ambulatory Visit: Payer: Self-pay | Admitting: Internal Medicine

## 2020-07-22 DIAGNOSIS — B20 Human immunodeficiency virus [HIV] disease: Secondary | ICD-10-CM

## 2020-07-30 ENCOUNTER — Ambulatory Visit (INDEPENDENT_AMBULATORY_CARE_PROVIDER_SITE_OTHER): Payer: Self-pay | Admitting: Internal Medicine

## 2020-07-30 ENCOUNTER — Other Ambulatory Visit: Payer: Self-pay

## 2020-07-30 ENCOUNTER — Encounter: Payer: Self-pay | Admitting: Internal Medicine

## 2020-07-30 VITALS — BP 146/92 | HR 86 | Wt 165.0 lb

## 2020-07-30 DIAGNOSIS — Z716 Tobacco abuse counseling: Secondary | ICD-10-CM

## 2020-07-30 DIAGNOSIS — E785 Hyperlipidemia, unspecified: Secondary | ICD-10-CM

## 2020-07-30 DIAGNOSIS — I25110 Atherosclerotic heart disease of native coronary artery with unstable angina pectoris: Secondary | ICD-10-CM

## 2020-07-30 DIAGNOSIS — B20 Human immunodeficiency virus [HIV] disease: Secondary | ICD-10-CM

## 2020-07-30 DIAGNOSIS — Z113 Encounter for screening for infections with a predominantly sexual mode of transmission: Secondary | ICD-10-CM

## 2020-07-30 DIAGNOSIS — I2119 ST elevation (STEMI) myocardial infarction involving other coronary artery of inferior wall: Secondary | ICD-10-CM

## 2020-07-30 MED ORDER — ASPIRIN 81 MG PO CHEW: 81.0000 mg | CHEWABLE_TABLET | Freq: Every day | ORAL | 3 refills | Status: DC

## 2020-07-30 MED ORDER — ATORVASTATIN CALCIUM 80 MG PO TABS: 80.0000 mg | ORAL_TABLET | Freq: Every day | ORAL | 3 refills | Status: DC

## 2020-07-30 NOTE — Progress Notes (Signed)
   Subjective:    Patient ID: Gregory Grant, male    DOB: 03/04/1966, 54 y.o.   MRN: [id number]  HPI Here for follow up of HIV. He continues on Biktarvy and no missed doses.  CD4 of 994 and viral load remains < 20.  No new issues.  Has not had or willing to get the COVID vaccine.  He is not taking any of his other medications and is not following with a PCP despite recent STEMI last year.     Review of Systems  Constitutional: Negative for unexpected weight change.  Respiratory: Negative for chest tightness and shortness of breath.   Gastrointestinal: Negative for diarrhea and nausea.  Skin: Negative for rash.       Objective:   Physical Exam Constitutional:      General: He is not in acute distress.    Appearance: He is well-developed.  Eyes:     General: No scleral icterus. Cardiovascular:     Rate and Rhythm: Normal rate and regular rhythm.     Heart sounds: Normal heart sounds. No murmur heard.   Pulmonary:     Effort: Pulmonary effort is normal. No respiratory distress.     Breath sounds: Normal breath sounds.  Skin:    Findings: No rash.  Neurological:     General: No focal deficit present.    SH: + tobacco and has decided he will not quit       Assessment & Plan:

## 2020-07-30 NOTE — Assessment & Plan Note (Signed)
He is not following anyone for this.  I have refilled his statin and aspirin and will help him get into primary care.

## 2020-07-30 NOTE — Assessment & Plan Note (Signed)
He continues to do well with his Biktarvy and no new issues. He can rtc in 6 months.

## 2020-07-30 NOTE — Assessment & Plan Note (Signed)
I have emphasized the need to quit smoking

## 2020-07-30 NOTE — Assessment & Plan Note (Signed)
I have refilled the statin

## 2020-07-30 NOTE — Assessment & Plan Note (Signed)
Screened negative 

## 2020-08-06 ENCOUNTER — Telehealth: Payer: Self-pay

## 2020-08-06 NOTE — Telephone Encounter (Signed)
There is usually a specific document that gets filled out from housing authority and I can fill that out if sent to me. thanks

## 2020-08-06 NOTE — Telephone Encounter (Signed)
Received call today from [facility] Case Manager requesting letter for housing.  States patient is currently homeless and will need letter supporting  he has a disability related to his HIV. Patient does not have a PCP at time. Will forward message to MD regarding letter. Case manager would like a call back. Sheltha Ross P: [phone]

## 2020-08-08 ENCOUNTER — Emergency Department (HOSPITAL_COMMUNITY)
Admission: EM | Admit: 2020-08-08 | Discharge: 2020-08-09 | Disposition: A | Discharge status: Home / Self Care | Payer: PRIVATE HEALTH INSURANCE | Attending: Emergency Medicine | Admitting: Emergency Medicine

## 2020-08-08 ENCOUNTER — Encounter (HOSPITAL_COMMUNITY): Payer: Self-pay | Admitting: Emergency Medicine

## 2020-08-08 ENCOUNTER — Other Ambulatory Visit: Payer: Self-pay

## 2020-08-08 ENCOUNTER — Emergency Department (HOSPITAL_COMMUNITY): Payer: PRIVATE HEALTH INSURANCE

## 2020-08-08 DIAGNOSIS — F17210 Nicotine dependence, cigarettes, uncomplicated: Secondary | ICD-10-CM | POA: Insufficient documentation

## 2020-08-08 DIAGNOSIS — I2510 Atherosclerotic heart disease of native coronary artery without angina pectoris: Secondary | ICD-10-CM | POA: Insufficient documentation

## 2020-08-08 DIAGNOSIS — R079 Chest pain, unspecified: Secondary | ICD-10-CM

## 2020-08-08 DIAGNOSIS — Z7982 Long term (current) use of aspirin: Secondary | ICD-10-CM | POA: Insufficient documentation

## 2020-08-08 LAB — CBC
HCT: 46 % (ref 39.0–52.0)
Hemoglobin: 15.5 g/dL (ref 13.0–17.0)
MCH: 34.8 pg — ABNORMAL HIGH (ref 26.0–34.0)
MCHC: 33.7 g/dL (ref 30.0–36.0)
MCV: 103.4 fL — ABNORMAL HIGH (ref 80.0–100.0)
Platelets: 176 10*3/uL (ref 150–400)
RBC: 4.45 MIL/uL (ref 4.22–5.81)
RDW: 14.6 % (ref 11.5–15.5)
WBC: 8.2 10*3/uL (ref 4.0–10.5)
nRBC: 0 % (ref 0.0–0.2)

## 2020-08-08 LAB — BASIC METABOLIC PANEL
Anion gap: 15 (ref 5–15)
BUN: 10 mg/dL (ref 6–20)
CO2: 16 mmol/L — ABNORMAL LOW (ref 22–32)
Calcium: 9.2 mg/dL (ref 8.9–10.3)
Chloride: 106 mmol/L (ref 98–111)
Creatinine, Ser: 1.03 mg/dL (ref 0.61–1.24)
GFR calc Af Amer: >60 mL/min (ref >60)
GFR calc non Af Amer: >60 mL/min (ref >60)
Glucose, Bld: 91 mg/dL (ref 70–99)
Potassium: 4.2 mmol/L (ref 3.5–5.1)
Sodium: 137 mmol/L (ref 135–145)

## 2020-08-08 LAB — TROPONIN I (HIGH SENSITIVITY)
Troponin I (High Sensitivity): 7 ng/L (ref <18)
Troponin I (High Sensitivity): 7 ng/L (ref <18)

## 2020-08-08 LAB — D-DIMER, QUANTITATIVE: D-Dimer, Quant: 0.57 ug/mL-FEU — ABNORMAL HIGH (ref 0.00–0.50)

## 2020-08-08 NOTE — ED Triage Notes (Addendum)
Pt arrives via [facility] from home with c/o chest pain that is worse with exertion and deep inspiration, c/o pain to central chest with no radiation. Denies fever or chills but endorses cough. Received 324mg  asa, 1 sl nitro with no change in pain.pt reports he had R artery stents placed last week.  EMS BP 138/100, HR 60, O2 96% on ra. A/ox4. 18G IV placed by ems

## 2020-08-09 ENCOUNTER — Emergency Department (HOSPITAL_COMMUNITY): Payer: PRIVATE HEALTH INSURANCE

## 2020-08-09 MED ORDER — ACETAMINOPHEN 500 MG PO TABS
1000.0000 mg | ORAL_TABLET | Freq: Once | ORAL | Status: AC
  Administered 2020-08-09: 1000 mg via ORAL
  Filled 2020-08-09: qty 2

## 2020-08-09 MED ORDER — TICAGRELOR 90 MG PO TABS
90.0000 mg | ORAL_TABLET | Freq: Once | ORAL | Status: AC
  Administered 2020-08-09: 90 mg via ORAL
  Filled 2020-08-09: qty 1

## 2020-08-09 MED ORDER — IOHEXOL 300 MG/ML  SOLN
80.0000 mL | Freq: Once | INTRAMUSCULAR | Status: AC | PRN
  Administered 2020-08-09: 80 mL via INTRAVENOUS

## 2020-08-09 MED ORDER — ASPIRIN 81 MG PO CHEW
81.0000 mg | CHEWABLE_TABLET | Freq: Once | ORAL | Status: AC
  Administered 2020-08-09: 81 mg via ORAL
  Filled 2020-08-09: qty 1

## 2020-08-09 MED ORDER — BICTEGRAVIR-EMTRICITAB-TENOFOV 50-200-25 MG PO TABS
1.0000 | ORAL_TABLET | Freq: Once | ORAL | Status: AC
  Administered 2020-08-09: 1 via ORAL
  Filled 2020-08-09: qty 1

## 2020-08-09 NOTE — ED Provider Notes (Signed)
[facility] EMERGENCY DEPARTMENT Provider Note   CSN: [id number] Arrival date & time: 08/08/20  1106     History Chief Complaint  Patient presents with  . Chest Pain    Gregory Grant is a 54 y.o. male.  The history is provided by the patient and medical records.  Chest Pain  54 year old male with history of HIV, GERD, coronary artery disease, hyperlipidemia, presenting to the ED with chest pain.  Patient states over the past week he has been having midsternal/central chest pain.  States he feels this mostly with exertion or deep breathing, pain usually subsides when he sits and rest.  He denies any fever or chills.  He does have a slight cough earlier today without production.  He has not had any sick contacts or Covid exposures.  Patient had STEMI August 2020 with 2 DES placed to mid and distal RCA.  States he had some other blockages that they wanted to intervene on later but he never made a follow-up appointment with cardiology.  He is still smoking daily.  He denies any history of DVT or PE.  States he has been compliant with his aspirin and Brilinta.  Past Medical History:  Diagnosis Date  . Coughing 02/2015  . GERD (gastroesophageal reflux disease)   . HIV (human immunodeficiency virus infection) [facility])     Patient Active Problem List   Diagnosis Date Noted  . Acute ST elevation myocardial infarction (STEMI) of inferolateral wall (HCC) 06/24/2019    Class: Hospitalized for  . Coronary artery disease involving native coronary artery of native heart with unstable angina pectoris (HCC) 06/24/2019  . Acute ST elevation myocardial infarction (STEMI) involving right coronary artery (HCC) 06/24/2019  . Medication monitoring encounter 06/21/2018  . Alcohol use 03/30/2018  . Right hip pain 03/30/2018  . Hyperlipidemia with target low density lipoprotein (LDL) cholesterol less than 70 mg/dL 58/08/9832  . Screening examination for venereal disease 12/22/2017  .  Healthcare maintenance 12/22/2017  . Human immunodeficiency virus (HIV) disease (HCC) 04/13/2017  . Tobacco abuse counseling 04/13/2017  . Prediabetes 03/16/2015  . 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/14/2015    Past Surgical History:  Procedure Laterality Date  . CORONARY STENT INTERVENTION N/A 06/24/2019   Procedure: CORONARY STENT INTERVENTION;  Surgeon: Marykay Lex, MD;  Location: [facility] INVASIVE CV LAB;  Service: Cardiovascular;  Laterality: N/A;  . CORONARY/GRAFT ACUTE MI REVASCULARIZATION N/A 06/24/2019   Procedure: Coronary/Graft Acute MI Revascularization;  Surgeon: Marykay Lex, MD;  Location: [facility] INVASIVE CV LAB;  Service: Cardiovascular;  Laterality: N/A;  . LEFT HEART CATH AND CORONARY ANGIOGRAPHY N/A 06/24/2019   Procedure: LEFT HEART CATH AND CORONARY ANGIOGRAPHY;  Surgeon: Marykay Lex, MD;  Location: [facility] INVASIVE CV LAB;  Service: Cardiovascular;  Laterality: N/A;       No family history on file.  Social History   Tobacco Use  . Smoking status: Current Every Day Smoker    Packs/day: 1.00    Years: 30.00    Pack years: 30.00    Types: Cigarettes  . Smokeless tobacco: Never Used  Substance Use Topics  . Alcohol use: Yes    Alcohol/week: 2.0 standard drinks    Types: 2 Cans of beer per week  . Drug use: No    Home Medications Prior to Admission medications   Medication Sig Start Date End Date Taking? Authorizing Provider  aspirin 81 MG chewable tablet Chew 1 tablet (81 mg total) by mouth daily. 07/30/20   Staci Righter  W, MD  atorvastatin (LIPITOR) 80 MG tablet Take 1 tablet (80 mg total) by mouth daily at 6 PM. 07/30/20   Comer, Belia Hemanobert W, MD  BIKTARVY 50-200-25 MG TABS tablet TAKE 1 TABLET BY MOUTH DAILY 07/23/20   Comer, Belia Hemanobert W, MD  ticagrelor (BRILINTA) 90 MG TABS tablet Take 1 tablet (90 mg total) by mouth 2 (two) times daily. Patient not taking: Reported on 07/30/2020 06/25/19   Filbert SchilderMcDaniel, Jill D, NP    Allergies    Patient has no known  allergies.  Review of Systems   Review of Systems  Cardiovascular: Positive for chest pain.  All other systems reviewed and are negative.   Physical Exam Updated Vital Signs BP (!) 156/100 (BP Location: Right Arm)   Pulse 69   Temp 98.1 F (36.7 C) (Oral)   Resp (!) 22   SpO2 99%   Physical Exam Vitals and nursing note reviewed.  Constitutional:      Appearance: He is well-developed.  HENT:     Head: Normocephalic and atraumatic.  Eyes:     Conjunctiva/sclera: Conjunctivae normal.     Pupils: Pupils are equal, round, and reactive to light.  Cardiovascular:     Rate and Rhythm: Normal rate and regular rhythm.     Heart sounds: Normal heart sounds.  Pulmonary:     Effort: Pulmonary effort is normal.     Breath sounds: Normal breath sounds. No decreased breath sounds or wheezing.  Abdominal:     General: Bowel sounds are normal.     Palpations: Abdomen is soft.  Musculoskeletal:        General: Normal range of motion.     Cervical back: Normal range of motion.  Skin:    General: Skin is warm and dry.  Neurological:     Mental Status: He is alert and oriented to person, place, and time.     ED Results / Procedures / Treatments   Labs (all labs ordered are listed, but only abnormal results are displayed) Labs Reviewed  BASIC METABOLIC PANEL - Abnormal; Notable for the following components:      Result Value   CO2 16 (*)    All other components within normal limits  CBC - Abnormal; Notable for the following components:   MCV 103.4 (*)    MCH 34.8 (*)    All other components within normal limits  D-DIMER, QUANTITATIVE (NOT [facility]) - Abnormal; Notable for the following components:   D-Dimer, Quant 0.57 (*)    All other components within normal limits  TROPONIN I (HIGH SENSITIVITY)  TROPONIN I (HIGH SENSITIVITY)    EKG EKG Interpretation  Date/Time:  Wednesday August 08 2020 11:05:34 EDT Ventricular Rate:  70 PR Interval:  144 QRS Duration: 74 QT  Interval:  402 QTC Calculation: 434 R Axis:   -46 Text Interpretation: Normal sinus rhythm with sinus arrhythmia Left axis deviation Abnormal ECG Confirmed by Zadie RhineWickline, Donald ([id number]) on 08/09/2020 2:02:11 AM   Radiology DG Chest 2 View  Result Date: 08/08/2020 CLINICAL DATA:  Chest pain and shortness of breath EXAM: CHEST - 2 VIEW COMPARISON:  June 24, 2019 FINDINGS: Lungs are clear. Heart size and pulmonary vascularity are normal. No adenopathy. No pneumothorax. No bone lesions. IMPRESSION: Lungs clear.  Cardiac silhouette normal. Electronically Signed   By: Bretta BangWilliam  Woodruff III M.D.   On: 08/08/2020 11:31   CT Angio Chest PE W and/or Wo Contrast  Result Date: 08/09/2020 CLINICAL DATA:  54 year old male with chest pain  and shortness of breath. EXAM: CT ANGIOGRAPHY CHEST WITH CONTRAST TECHNIQUE: Multidetector CT imaging of the chest was performed using the standard protocol during bolus administration of intravenous contrast. Multiplanar CT image reconstructions and MIPs were obtained to evaluate the vascular anatomy. CONTRAST:  64mL OMNIPAQUE IOHEXOL 300 MG/ML  SOLN COMPARISON:  Chest radiograph dated 08/08/2020. FINDINGS: Cardiovascular: Borderline cardiomegaly. No pericardial effusion. Mild atherosclerotic calcification of the thoracic aorta. No aneurysmal dilatation. There is no CT evidence of pulmonary embolism. Mediastinum/Nodes: No hilar or mediastinal adenopathy. The esophagus is grossly unremarkable. No mediastinal fluid collection. Lungs/Pleura: Minimal bibasilar dependent atelectasis. No focal consolidation, pleural effusion, or pneumothorax. The central airways are patent. Upper Abdomen: No acute abnormality. Musculoskeletal: Incompletely healed, subacute appearing, left posterior tenth rib fracture. No acute osseous pathology. Mild bilateral gynecomastia, right greater left. Review of the MIP images confirms the above findings. IMPRESSION: 1. No acute intrathoracic pathology. No CT  evidence of pulmonary embolism. 2. Incompletely healed, subacute appearing left posterior tenth rib fracture. 3. Aortic Atherosclerosis (ICD10-I70.0). Electronically Signed   By: Elgie Collard M.D.   On: 08/09/2020 03:06    Procedures Procedures (including critical care time)  Medications Ordered in ED Medications  aspirin chewable tablet 81 mg (has no administration in time range)  ticagrelor (BRILINTA) tablet 90 mg (has no administration in time range)  bictegravir-emtricitabine-tenofovir AF (BIKTARVY) 50-200-25 MG per tablet 1 tablet (has no administration in time range)  iohexol (OMNIPAQUE) 300 MG/ML solution 80 mL (80 mLs Intravenous Contrast Given 08/09/20 0253)    ED Course  I have reviewed the triage vital signs and the nursing notes.  Pertinent labs & imaging results that were available during my care of the patient were reviewed by me and considered in my medical decision making (see chart for details).    MDM Rules/Calculators/A&P  54 year old male with history of STEMI in July 2020 with placement of 2 DES to RCA, presenting to the ED with 1 week of exertional chest pain.  He has been lost to follow-up since prior cardiac cath.  States he has no pain at rest, but does experience discomfort with anything more than leisurely walk.  Also has some mild shortness of breath and pain in the right arm which were symptoms during prior MI as well.  Patient unfortunately with prolonged wait time of 13+ hours prior to my evaluation.  Currently he is chest pain-free.  His EKG is nonischemic.  Labs thus far reassuring including troponin x2.  D-dimer was sent and is slightly positive.  Chest x-ray is negative.  Will get CTA of the chest for further evaluation.  He denies history of DVT or PE.  CTA is negative.  Patient remains chest pain-free here.  Patient appears to have stable angina.  Will speak with physician on call for recommendations regarding further work-up going forward (IP vs  OP).  5:25 AM Spoke with cardiology fellow, Dr. Mackie Pai-- Patient would benefit from stress test but given he has already had prolonged ED observation (18+ hours), negative work-up, and stable symptoms can likely be done as an OP.  He will definitely need to have close OP follow-up.    I have messaged patient's cardiologist, Dr. Herbie Baltimore, to help facilitate follow-up in a timely manner since he never made it to clinic previously.  Plan was discussed with patient and he is comfortable with this. He did request his night time dose of ASA, brilinta, and biktarvy that he missed last evening while in waiting room which have been ordered.  He understands if pain worsens or begins to occur at rest, he will need to return to the ED.  If he does not hear from cardiology office in the next few days, will need to call and follow-up on his appointment.  Final Clinical Impression(s) / ED Diagnoses Final diagnoses:  Exertional chest pain    Rx / DC Orders ED Discharge Orders    None       Garlon Hatchet, PA-C 08/09/20 0612    Zadie Rhine, MD 08/10/20 [phone]

## 2020-08-09 NOTE — Discharge Instructions (Addendum)
Follow-up with cardiology in clinic.  I have messaged your doctor to help speed this along.  If you do not hear from then soon, please call to office to make sure you get an appointment. Continue your daily meds as directed. If you begin to have uncontrolled chest pain (occurring at rest, >30 mins, sweating, dizzy, etc) please return to the ED.

## 2020-08-18 ENCOUNTER — Other Ambulatory Visit: Payer: Self-pay | Admitting: Internal Medicine

## 2020-08-18 DIAGNOSIS — B20 Human immunodeficiency virus [HIV] disease: Secondary | ICD-10-CM

## 2020-10-11 ENCOUNTER — Other Ambulatory Visit: Payer: Self-pay

## 2020-10-11 ENCOUNTER — Ambulatory Visit: Payer: Self-pay | Attending: Family Medicine | Admitting: Family Medicine

## 2020-10-11 ENCOUNTER — Other Ambulatory Visit: Payer: Self-pay | Admitting: Family Medicine

## 2020-10-11 ENCOUNTER — Encounter: Payer: Self-pay | Admitting: Family Medicine

## 2020-10-11 VITALS — BP 174/89 | HR 82 | Temp 97.0°F | Ht 67.5 in | Wt 166.8 lb

## 2020-10-11 DIAGNOSIS — L03313 Cellulitis of chest wall: Secondary | ICD-10-CM

## 2020-10-11 DIAGNOSIS — E785 Hyperlipidemia, unspecified: Secondary | ICD-10-CM

## 2020-10-11 DIAGNOSIS — B20 Human immunodeficiency virus [HIV] disease: Secondary | ICD-10-CM

## 2020-10-11 DIAGNOSIS — G8929 Other chronic pain: Secondary | ICD-10-CM

## 2020-10-11 DIAGNOSIS — I10 Essential (primary) hypertension: Secondary | ICD-10-CM

## 2020-10-11 DIAGNOSIS — M5441 Lumbago with sciatica, right side: Secondary | ICD-10-CM

## 2020-10-11 DIAGNOSIS — Z7982 Long term (current) use of aspirin: Secondary | ICD-10-CM

## 2020-10-11 DIAGNOSIS — Z789 Other specified health status: Secondary | ICD-10-CM

## 2020-10-11 DIAGNOSIS — I25110 Atherosclerotic heart disease of native coronary artery with unstable angina pectoris: Secondary | ICD-10-CM

## 2020-10-11 DIAGNOSIS — Z5900 Homelessness unspecified: Secondary | ICD-10-CM

## 2020-10-11 MED ORDER — AMOXICILLIN-POT CLAVULANATE 500-125 MG PO TABS: 1.0000 | ORAL_TABLET | Freq: Two times a day (BID) | ORAL | 0 refills | Status: DC

## 2020-10-11 MED ORDER — LOSARTAN POTASSIUM 50 MG PO TABS: 50.0000 mg | ORAL_TABLET | Freq: Every day | ORAL | 5 refills | Status: DC

## 2020-10-11 MED ORDER — FAMOTIDINE 20 MG PO TABS: 20.0000 mg | ORAL_TABLET | Freq: Two times a day (BID) | ORAL | 11 refills | Status: DC

## 2020-10-11 MED ORDER — IBUPROFEN 600 MG PO TABS: 600.0000 mg | ORAL_TABLET | Freq: Three times a day (TID) | ORAL | 0 refills | Status: DC | PRN

## 2020-10-11 MED ORDER — ASPIRIN EC 81 MG PO TBEC: 81.0000 mg | DELAYED_RELEASE_TABLET | Freq: Every day | ORAL | 11 refills | Status: DC

## 2020-10-11 MED ORDER — ATORVASTATIN CALCIUM 80 MG PO TABS: 80.0000 mg | ORAL_TABLET | Freq: Every day | ORAL | 11 refills | Status: DC

## 2020-10-11 MED FILL — IBUPROFEN 600 MG TABLET: 600 | 10 days supply | Qty: 30 | Fill #0

## 2020-10-11 MED FILL — ATORVASTATIN CALCIUM 80 MG: 80 | 30 days supply | Qty: 30 | Fill #0

## 2020-10-11 MED FILL — LOSARTAN POTASSIUM 50 MG TA: 50 | 30 days supply | Qty: 30 | Fill #0

## 2020-10-11 MED FILL — FAMOTIDINE 20 MG TABS: 20 | 30 days supply | Qty: 60 | Fill #0

## 2020-10-11 MED FILL — AMOX-CLAV 500-125 MG TABLET: 500-125 | 10 days supply | Qty: 20 | Fill #0
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
## 2020-10-18 ENCOUNTER — Ambulatory Visit (HOSPITAL_COMMUNITY)
Admission: EM | Admit: 2020-10-18 | Discharge: 2020-10-18 | Disposition: A | Discharge status: Home / Self Care | Payer: Self-pay | Attending: Family Medicine | Admitting: Family Medicine

## 2020-10-18 ENCOUNTER — Other Ambulatory Visit: Payer: Self-pay

## 2020-10-18 ENCOUNTER — Encounter: Payer: Self-pay | Admitting: Family Medicine

## 2020-10-18 ENCOUNTER — Ambulatory Visit: Payer: Self-pay | Attending: Family Medicine | Admitting: Family Medicine

## 2020-10-18 ENCOUNTER — Encounter (HOSPITAL_COMMUNITY): Payer: Self-pay | Admitting: Emergency Medicine

## 2020-10-18 VITALS — BP 130/85 | HR 94 | Wt 170.0 lb

## 2020-10-18 DIAGNOSIS — L0291 Cutaneous abscess, unspecified: Secondary | ICD-10-CM | POA: Insufficient documentation

## 2020-10-18 DIAGNOSIS — L02213 Cutaneous abscess of chest wall: Secondary | ICD-10-CM

## 2020-10-18 NOTE — Discharge Instructions (Addendum)
You need to go to the breast center for an ultrasound  Call your doctor tomorrow to get this ordered for you Continue antibiotics We will notify you if you need a change in antibiotics

## 2020-10-18 NOTE — ED Triage Notes (Signed)
Pt presents with abscess on right side of chest. States was seen on 10/28 and received antibiotic but has gotten no relief since.

## 2020-10-18 NOTE — Progress Notes (Signed)
ABSCESS IS BIGGER AND SORE

## 2020-10-18 NOTE — Progress Notes (Signed)
Established Patient Office Visit  Subjective:  Patient ID: Gregory Grant, male    DOB: 23-Dec-1965  Age: 54 y.o. MRN: [id number]  CC:  Chief Complaint  Patient presents with  . Follow-up    HPI Gregory Grant, 54 yo male with HIV, CAD who is seen in follow-up of new patient visit on 10/11/2020 and at that time, patient with some redness and increased warmth over the right chest wall/breast area. He was placed on Augmentin at his last visit but today has had an increase in the size of the area of redness and increased warmth and the area is now raised and swollen. He is also having chills and increased pain.   Past Medical History:  Diagnosis Date  . Coughing 02/2015  . GERD (gastroesophageal reflux disease)   . HIV (human immunodeficiency virus infection) (HCC)     Past Surgical History:  Procedure Laterality Date  . CORONARY STENT INTERVENTION N/A 06/24/2019   Procedure: CORONARY STENT INTERVENTION;  Surgeon: Marykay Lex, MD;  Location: [facility] INVASIVE CV LAB;  Service: Cardiovascular;  Laterality: N/A;  . CORONARY/GRAFT ACUTE MI REVASCULARIZATION N/A 06/24/2019   Procedure: Coronary/Graft Acute MI Revascularization;  Surgeon: Marykay Lex, MD;  Location: [facility]VASIVE CV LAB;  Service: Cardiovascular;  Laterality: N/A;  . LEFT HEART CATH AND CORONARY ANGIOGRAPHY N/A 06/24/2019   Procedure: LEFT HEART CATH AND CORONARY ANGIOGRAPHY;  Surgeon: Marykay Lex, MD;  Location: [facility] INVASIVE CV LAB;  Service: Cardiovascular;  Laterality: N/A;    Family History  Problem Relation Age of Onset  . Dementia Mother   . Diabetes Father     Social History   Socioeconomic History  . Marital status: Married    Spouse name: Not on file  . Number of children: Not on file  . Years of education: Not on file  . Highest education level: Not on file  Occupational History  . Not on file  Tobacco Use  . Smoking status: Current Every Day Smoker    Packs/day: 1.00    Years: 30.00     Pack years: 30.00    Types: Cigarettes  . Smokeless tobacco: Never Used  Substance and Sexual Activity  . Alcohol use: Yes    Alcohol/week: 2.0 standard drinks    Types: 2 Cans of beer per week  . Drug use: No  . Sexual activity: Yes    Comment: declined condoms  Other Topics Concern  . Not on file  Social History Narrative  . Not on file   Social Determinants of Health   Financial Resource Strain:   . Difficulty of Paying Living Expenses: Not on file  Food Insecurity:   . Worried About Programme researcher, broadcasting/film/video in the Last Year: Not on file  . Ran Out of Food in the Last Year: Not on file  Transportation Needs:   . Lack of Transportation (Medical): Not on file  . Lack of Transportation (Non-Medical): Not on file  Physical Activity:   . Days of Exercise per Week: Not on file  . Minutes of Exercise per Session: Not on file  Stress:   . Feeling of Stress : Not on file  Social Connections:   . Frequency of Communication with Friends and Family: Not on file  . Frequency of Social Gatherings with Friends and Family: Not on file  . Attends Religious Services: Not on file  . Active Member of Clubs or Organizations: Not on file  . Attends Banker Meetings: Not  on file  . Marital Status: Not on file  Intimate Partner Violence:   . Fear of Current or Ex-Partner: Not on file  . Emotionally Abused: Not on file  . Physically Abused: Not on file  . Sexually Abused: Not on file    Outpatient Medications Prior to Visit  Medication Sig Dispense Refill  . amoxicillin-clavulanate (AUGMENTIN) 500-125 MG tablet Take 1 tablet (500 mg total) by mouth 2 (two) times daily. Take after eating 20 tablet 0  . aspirin EC 81 MG tablet Take 1 tablet (81 mg total) by mouth daily. 30 tablet 11  . atorvastatin (LIPITOR) 80 MG tablet Take 1 tablet (80 mg total) by mouth daily. 30 tablet 11  . BIKTARVY 50-200-25 MG TABS tablet TAKE 1 TABLET BY MOUTH DAILY 30 tablet 5  . famotidine (PEPCID) 20 MG  tablet Take 1 tablet (20 mg total) by mouth 2 (two) times daily. To protect stomach 60 tablet 11  . ibuprofen (ADVIL) 600 MG tablet Take 1 tablet (600 mg total) by mouth every 8 (eight) hours as needed. For pain; take after eating 30 tablet 0  . losartan (COZAAR) 50 MG tablet Take 1 tablet (50 mg total) by mouth daily. To lower blood pressure 30 tablet 5   No facility-administered medications prior to visit.    No Known Allergies  ROS Review of Systems  Constitutional: Positive for chills and fatigue. Negative for fever.  Respiratory: Positive for cough. Negative for shortness of breath.   Cardiovascular: Negative for chest pain and palpitations.  Gastrointestinal: Negative for abdominal pain, constipation, diarrhea and nausea.  Musculoskeletal: Positive for arthralgias and back pain.  Neurological: Negative for dizziness and headaches.      Objective:    Physical Exam Vitals and nursing note reviewed.  Constitutional:      Appearance: Normal appearance.     Comments: WNWD older male sitting in a chair in exam room and is sitting in a slightly bent position/leaning forward and holding onto his cane  Cardiovascular:     Rate and Rhythm: Normal rate and regular rhythm.  Pulmonary:     Effort: Pulmonary effort is normal.     Breath sounds: Normal breath sounds.  Skin:    Comments: Patient with raised, tender, mobile nodule underneath the areola of the right breast with  redness and increased warmth over the overlying skin area  Neurological:     Mental Status: He is alert.  Psychiatric:        Mood and Affect: Mood normal.        Behavior: Behavior normal.     BP 130/85 (BP Location: Right Arm, Patient Position: Sitting)   Pulse 94   Wt 170 lb (77.1 kg)   SpO2 97%   BMI 26.23 kg/m  Wt Readings from Last 3 Encounters:  10/18/20 170 lb (77.1 kg)  10/11/20 166 lb 12.8 oz (75.7 kg)  07/30/20 165 lb (74.8 kg)     Health Maintenance Due  Topic Date Due  . COVID-19  Vaccine (1) Never done  . TETANUS/TDAP  Never done  . COLONOSCOPY  Never done  . INFLUENZA VACCINE  07/15/2020    There are no preventive care reminders to display for this patient.  No results found for: TSH Lab Results  Component Value Date   WBC 8.2 08/08/2020   HGB 15.5 08/08/2020   HCT 46.0 08/08/2020   MCV 103.4 (H) 08/08/2020   PLT 176 08/08/2020   Lab Results  Component Value Date  NA 137 08/08/2020   K 4.2 08/08/2020   CO2 16 (L) 08/08/2020   GLUCOSE 91 08/08/2020   BUN 10 08/08/2020   CREATININE 1.03 08/08/2020   BILITOT 0.6 07/16/2020   ALKPHOS 57 06/24/2019   AST 28 07/16/2020   ALT 17 07/16/2020   PROT 6.9 07/16/2020   ALBUMIN 3.7 06/24/2019   CALCIUM 9.2 08/08/2020   ANIONGAP 15 08/08/2020   Lab Results  Component Value Date   CHOL 169 06/24/2019   Lab Results  Component Value Date   HDL 38 (L) 06/24/2019   Lab Results  Component Value Date   LDLCALC 120 (H) 06/24/2019   Lab Results  Component Value Date   TRIG 56 06/24/2019   Lab Results  Component Value Date   CHOLHDL 4.4 06/24/2019   Lab Results  Component Value Date   HGBA1C 5.5 06/24/2019      Assessment & Plan:  1. Cutaneous abscess of chest wall Patient has been asked to go to ED or urgent care to see if this area requires I&D or other intervention     Follow-up: Return for please go to the ED or urgent care for treatment of abscess; schedule f/u afterwards.   Cain Saupe, MD

## 2020-10-18 NOTE — ED Provider Notes (Signed)
[facility]    CSN: [id number] Arrival date & time: 10/18/20  1530      History   Chief Complaint Chief Complaint  Patient presents with  . Abscess    HPI Gregory Grant is a 54 y.o. male.   HPI  Abscess of right breast. Very painful and large. He is on Augmentin. Just started today. States that he was sent here by his PCP for drainage. Patient has HIV disease. He is compliant with treatment Patient has a history of heart disease, hypertension, hyperlipidemia. He continues to smoke cigarettes although he is compliant with his medication. He, unfortunately, is currently homeless and is living with a variety of friends moving different places house to house  Past Medical History:  Diagnosis Date  . Coughing 02/2015  . GERD (gastroesophageal reflux disease)   . HIV (human immunodeficiency virus infection) [facility])     Patient Active Problem List   Diagnosis Date Noted  . Acute ST elevation myocardial infarction (STEMI) of inferolateral wall (HCC) 06/24/2019    Class: Hospitalized for  . Coronary artery disease involving native coronary artery of native heart with unstable angina pectoris (HCC) 06/24/2019  . Acute ST elevation myocardial infarction (STEMI) involving right coronary artery (HCC) 06/24/2019  . Medication monitoring encounter 06/21/2018  . Alcohol use 03/30/2018  . Right hip pain 03/30/2018  . Hyperlipidemia with target low density lipoprotein (LDL) cholesterol less than 70 mg/dL 09/32/3557  . Screening examination for venereal disease 12/22/2017  . Healthcare maintenance 12/22/2017  . Human immunodeficiency virus (HIV) disease (HCC) 04/13/2017  . Tobacco abuse counseling 04/13/2017  . Prediabetes 03/16/2015  . 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/14/2015    Past Surgical History:  Procedure Laterality Date  . CORONARY STENT INTERVENTION N/A 06/24/2019   Procedure: CORONARY STENT INTERVENTION;  Surgeon: Marykay Lex, MD;  Location: [facility] INVASIVE CV  LAB;  Service: Cardiovascular;  Laterality: N/A;  . CORONARY/GRAFT ACUTE MI REVASCULARIZATION N/A 06/24/2019   Procedure: Coronary/Graft Acute MI Revascularization;  Surgeon: Marykay Lex, MD;  Location: [facility] INVASIVE CV LAB;  Service: Cardiovascular;  Laterality: N/A;  . LEFT HEART CATH AND CORONARY ANGIOGRAPHY N/A 06/24/2019   Procedure: LEFT HEART CATH AND CORONARY ANGIOGRAPHY;  Surgeon: Marykay Lex, MD;  Location: [facility] INVASIVE CV LAB;  Service: Cardiovascular;  Laterality: N/A;       Home Medications    Prior to Admission medications   Medication Sig Start Date End Date Taking? Authorizing Provider  amoxicillin-clavulanate (AUGMENTIN) 500-125 MG tablet Take 1 tablet (500 mg total) by mouth 2 (two) times daily. Take after eating 10/11/20   Fulp, Cammie, MD  aspirin EC 81 MG tablet Take 1 tablet (81 mg total) by mouth daily. 10/11/20   Fulp, Cammie, MD  atorvastatin (LIPITOR) 80 MG tablet Take 1 tablet (80 mg total) by mouth daily. 10/11/20   Fulp, Hewitt Shorts, MD  BIKTARVY 50-200-25 MG TABS tablet TAKE 1 TABLET BY MOUTH DAILY 08/21/20   Comer, Belia Heman, MD  famotidine (PEPCID) 20 MG tablet Take 1 tablet (20 mg total) by mouth 2 (two) times daily. To protect stomach 10/11/20   Fulp, Cammie, MD  ibuprofen (ADVIL) 600 MG tablet Take 1 tablet (600 mg total) by mouth every 8 (eight) hours as needed. For pain; take after eating 10/11/20   Fulp, Cammie, MD  losartan (COZAAR) 50 MG tablet Take 1 tablet (50 mg total) by mouth daily. To lower blood pressure 10/11/20   Cain Saupe, MD    Family History Family  History  Problem Relation Age of Onset  . Dementia Mother   . Diabetes Father     Social History Social History   Tobacco Use  . Smoking status: Current Every Day Smoker    Packs/day: 1.00    Years: 30.00    Pack years: 30.00    Types: Cigarettes  . Smokeless tobacco: Never Used  Substance Use Topics  . Alcohol use: Yes    Alcohol/week: 2.0 standard drinks    Types: 2 Cans of  beer per week  . Drug use: No     Allergies   Patient has no known allergies.   Review of Systems Review of Systems  See HPI Physical Exam Triage Vital Signs ED Triage Vitals  Enc Vitals Group     BP 10/18/20 1612 119/89     Pulse Rate 10/18/20 1612 91     Resp 10/18/20 1612 17     Temp 10/18/20 1612 98.8 F (37.1 C)     Temp Source 10/18/20 1612 Oral     SpO2 10/18/20 1612 98 %     Weight --      Height --      Head Circumference --      Peak Flow --      Pain Score 10/18/20 1610 10     Pain Loc --      Pain Edu? --      Excl. in GC? --    No data found.  Updated Vital Signs BP 119/89 (BP Location: Left Arm)   Pulse 91   Temp 98.8 F (37.1 C) (Oral)   Resp 17   SpO2 98%       Physical Exam Constitutional:      General: He is not in acute distress.    Appearance: He is well-developed.  HENT:     Head: Normocephalic and atraumatic.  Eyes:     Conjunctiva/sclera: Conjunctivae normal.     Pupils: Pupils are equal, round, and reactive to light.  Cardiovascular:     Rate and Rhythm: Normal rate.  Pulmonary:     Effort: Pulmonary effort is normal. No respiratory distress.  Chest:     Breasts:        Right: Mass and tenderness present.     Abdominal:     General: There is no distension.     Palpations: Abdomen is soft.  Musculoskeletal:        General: Normal range of motion.     Cervical back: Normal range of motion.  Skin:    General: Skin is warm and dry.  Neurological:     Mental Status: He is alert.  Psychiatric:        Behavior: Behavior normal.    The skin overlying the breast is anesthetized with a 1 cc lidocaine wheal, 1% without lidocaine. I then placed an 18-gauge needle towards the center of the mass was able to aspirate 5 cc of purulent fluid. The mass deflated slightly, but appears to be loculated.  UC Treatments / Results  Labs (all labs ordered are listed, but only abnormal results are displayed) Labs Reviewed    AEROBIC/ANAEROBIC CULTURE (SURGICAL/DEEP WOUND)    EKG   Radiology No results found.  Procedures Procedures (including critical care time)  Medications Ordered in UC Medications - No data to display  Initial Impression / Assessment and Plan / UC Course  I have reviewed the triage vital signs and the nursing notes.  Pertinent labs & imaging results that  were available during my care of the patient were reviewed by me and considered in my medical decision making (see chart for details).     This requires an ultrasound at the breast center with a more thorough treatment of the abscess with imaging. I did remove some of the fluid for his comfort and to send for culture. Final Clinical Impressions(s) / UC Diagnoses   Final diagnoses:  Abscess     Discharge Instructions     You need to go to the breast center for an ultrasound  Call your doctor tomorrow to get this ordered for you Continue antibiotics We will notify you if you need a change in antibiotics   ED Prescriptions    None     PDMP not reviewed this encounter.   Eustace Moore, MD 10/18/20 1721

## 2020-10-19 ENCOUNTER — Telehealth: Payer: Self-pay | Admitting: Licensed Clinical Social Worker

## 2020-10-19 NOTE — Telephone Encounter (Signed)
BH intern called to make appt for SW services based on referral from PCP Dr. Jillyn Hidden, but it went straight to voicemail. Message was left with callback number.

## 2020-10-21 LAB — AEROBIC/ANAEROBIC CULTURE W GRAM STAIN (SURGICAL/DEEP WOUND): Special Requests: NORMAL

## 2020-10-22 ENCOUNTER — Other Ambulatory Visit: Payer: Self-pay | Admitting: Family Medicine

## 2020-10-22 ENCOUNTER — Telehealth: Payer: Self-pay | Admitting: Family Medicine

## 2020-10-22 DIAGNOSIS — L02213 Cutaneous abscess of chest wall: Secondary | ICD-10-CM

## 2020-10-22 NOTE — Telephone Encounter (Signed)
General surgery referral placed. Check with Arna Medici to seen when patient can be scheduled but also ask if a surgeon will be seeing patients here in the office this week or next and if so can patient be scheduled to be seen here. If patient is having worsening of pain or increased size of the abscess, please ask that he go to the ED for further evaluation

## 2020-10-22 NOTE — Progress Notes (Signed)
Patient ID: Gregory Grant, male   DOB: 1965/12/18, 54 y.o.   MRN: [id number]   54 yo male with right breast abscess. He is status post aspiration of some of the purulent material from the right breast area at urgent care and it was recommended that patient needed an ultrasound of the breast center.  Patient likely needs incision and drainage of the area and will be referred to general surgery for further evaluation and treatment.

## 2020-10-22 NOTE — Telephone Encounter (Signed)
Copied from CRM [id number]. Topic: General - Other >> Oct 22, 2020 10:55 AM Dalphine Handing A wrote: Patient stated that order has still not been place to [facility] as discussed during last visit  and patient would like order placed. Please advise

## 2020-10-23 NOTE — Telephone Encounter (Signed)
Please see if you can contact the breast center to find out if they do aspiration of abscesses under ultrasound guidance

## 2020-10-25 ENCOUNTER — Telehealth: Payer: Self-pay | Admitting: Licensed Clinical Social Worker

## 2020-10-25 NOTE — Telephone Encounter (Signed)
Call placed to patient regarding [facility] referral. LCSW left message requesting a return call.  

## 2020-10-26 ENCOUNTER — Other Ambulatory Visit: Payer: Self-pay | Admitting: Family Medicine

## 2020-10-26 DIAGNOSIS — L02213 Cutaneous abscess of chest wall: Secondary | ICD-10-CM

## 2020-10-26 DIAGNOSIS — N611 Abscess of the breast and nipple: Secondary | ICD-10-CM

## 2020-10-26 NOTE — Telephone Encounter (Signed)
Referral was placed for patient to be seen for aspiration of right breast abscess.  Please attempt to contact patient today.  If patient has had worsening of abscess, please instruct patient to go to urgent care or emergency department for further evaluation as he may need antibiotic treatment.  Please try to contact breast center to schedule aspiration of right breast abscess and make sure that they have a good contact number for the patient

## 2020-10-26 NOTE — Progress Notes (Signed)
Patient with abscess of the right breast.  Referral placed for patient to have ultrasound-guided aspiration

## 2020-10-26 NOTE — Telephone Encounter (Signed)
Patient is homeless and needs aspiration of right breast due to abscess.  Please see if you can contact patient, I am not sure if he is working with one of the local agencies for help with housing or with [facility] as CMA has been unable to contact patient.  Patient may need to go to the emergency department regarding his abscess because he is currently uninsured and therefore we are having difficulty getting patient to be seen by general surgery or by the breast center for aspiration of the abscess.  I am not sure if a mammogram scholarship would cover ultrasound-guided aspiration of abscess

## 2020-10-26 NOTE — Telephone Encounter (Signed)
Pt is self-pay///Contacted breast ctr and pt would first need diagnostic mammogram $685 Ultrasound 244 and then the aspiration is 294, att to contact pt to advise and find out whats going on with the abscess no ans unable to lvm

## 2020-10-30 ENCOUNTER — Telehealth: Payer: Self-pay

## 2020-10-30 NOTE — Telephone Encounter (Signed)
Attempted to contact patient # [phone] to discuss status of abscess and possible need to go to ED.  Message left with call back requested to this CM.   

## 2020-10-31 ENCOUNTER — Telehealth: Payer: Self-pay

## 2020-10-31 NOTE — Telephone Encounter (Signed)
Attempted to contact patient # [phone] to discuss status of abscess and possible need to go to ED.  Message left with call back requested to this CM.

## 2020-11-01 ENCOUNTER — Emergency Department (HOSPITAL_COMMUNITY)
Admission: EM | Admit: 2020-11-01 | Discharge: 2020-11-01 | Disposition: A | Discharge status: Home / Self Care | Payer: Self-pay | Attending: Emergency Medicine | Admitting: Emergency Medicine

## 2020-11-01 ENCOUNTER — Emergency Department (HOSPITAL_COMMUNITY): Payer: Self-pay

## 2020-11-01 ENCOUNTER — Encounter (HOSPITAL_COMMUNITY): Payer: Self-pay

## 2020-11-01 DIAGNOSIS — N611 Abscess of the breast and nipple: Secondary | ICD-10-CM | POA: Insufficient documentation

## 2020-11-01 DIAGNOSIS — Z21 Asymptomatic human immunodeficiency virus [HIV] infection status: Secondary | ICD-10-CM | POA: Insufficient documentation

## 2020-11-01 DIAGNOSIS — R001 Bradycardia, unspecified: Secondary | ICD-10-CM | POA: Insufficient documentation

## 2020-11-01 DIAGNOSIS — F17210 Nicotine dependence, cigarettes, uncomplicated: Secondary | ICD-10-CM | POA: Insufficient documentation

## 2020-11-01 DIAGNOSIS — I25110 Atherosclerotic heart disease of native coronary artery with unstable angina pectoris: Secondary | ICD-10-CM | POA: Insufficient documentation

## 2020-11-01 DIAGNOSIS — R1084 Generalized abdominal pain: Secondary | ICD-10-CM | POA: Insufficient documentation

## 2020-11-01 DIAGNOSIS — Z7982 Long term (current) use of aspirin: Secondary | ICD-10-CM | POA: Insufficient documentation

## 2020-11-01 DIAGNOSIS — Z955 Presence of coronary angioplasty implant and graft: Secondary | ICD-10-CM | POA: Insufficient documentation

## 2020-11-01 DIAGNOSIS — K219 Gastro-esophageal reflux disease without esophagitis: Secondary | ICD-10-CM | POA: Insufficient documentation

## 2020-11-01 LAB — COMPREHENSIVE METABOLIC PANEL
ALT: 13 U/L (ref 0–44)
AST: 18 U/L (ref 15–41)
Albumin: 3.5 g/dL (ref 3.5–5.0)
Alkaline Phosphatase: 68 U/L (ref 38–126)
Anion gap: 11 (ref 5–15)
BUN: 7 mg/dL (ref 6–20)
CO2: 23 mmol/L (ref 22–32)
Calcium: 9.4 mg/dL (ref 8.9–10.3)
Chloride: 106 mmol/L (ref 98–111)
Creatinine, Ser: 1 mg/dL (ref 0.61–1.24)
GFR, Estimated: >60 mL/min (ref >60)
Glucose, Bld: 125 mg/dL — ABNORMAL HIGH (ref 70–99)
Potassium: 3.8 mmol/L (ref 3.5–5.1)
Sodium: 140 mmol/L (ref 135–145)
Total Bilirubin: 0.8 mg/dL (ref 0.3–1.2)
Total Protein: 6.9 g/dL (ref 6.5–8.1)

## 2020-11-01 LAB — CBC WITH DIFFERENTIAL/PLATELET
Abs Immature Granulocytes: 0.02 10*3/uL (ref 0.00–0.07)
Basophils Absolute: 0.1 10*3/uL (ref 0.0–0.1)
Basophils Relative: 1 %
Eosinophils Absolute: 0.1 10*3/uL (ref 0.0–0.5)
Eosinophils Relative: 1 %
HCT: 44.9 % (ref 39.0–52.0)
Hemoglobin: 15.3 g/dL (ref 13.0–17.0)
Immature Granulocytes: 0 %
Lymphocytes Relative: 21 %
Lymphs Abs: 1.7 10*3/uL (ref 0.7–4.0)
MCH: 34.2 pg — ABNORMAL HIGH (ref 26.0–34.0)
MCHC: 34.1 g/dL (ref 30.0–36.0)
MCV: 100.2 fL — ABNORMAL HIGH (ref 80.0–100.0)
Monocytes Absolute: 0.6 10*3/uL (ref 0.1–1.0)
Monocytes Relative: 7 %
Neutro Abs: 5.7 10*3/uL (ref 1.7–7.7)
Neutrophils Relative %: 70 %
Platelets: 235 10*3/uL (ref 150–400)
RBC: 4.48 MIL/uL (ref 4.22–5.81)
RDW: 13.1 % (ref 11.5–15.5)
WBC: 8.1 10*3/uL (ref 4.0–10.5)
nRBC: 0 % (ref 0.0–0.2)

## 2020-11-01 LAB — URINALYSIS, ROUTINE W REFLEX MICROSCOPIC
Bilirubin Urine: NEGATIVE
Glucose, UA: NEGATIVE mg/dL
Hgb urine dipstick: NEGATIVE
Ketones, ur: 5 mg/dL — AB
Leukocytes,Ua: NEGATIVE
Nitrite: NEGATIVE
Protein, ur: NEGATIVE mg/dL
Specific Gravity, Urine: 1.013 (ref 1.005–1.030)
pH: 7 (ref 5.0–8.0)

## 2020-11-01 LAB — LIPASE, BLOOD: Lipase: 25 U/L (ref 11–51)

## 2020-11-01 LAB — TROPONIN I (HIGH SENSITIVITY)
Troponin I (High Sensitivity): 8 ng/L (ref <18)
Troponin I (High Sensitivity): 7 ng/L (ref <18)

## 2020-11-01 LAB — AEROBIC/ANAEROBIC CULTURE (SURGICAL/DEEP WOUND)

## 2020-11-01 MED ORDER — DOXYCYCLINE HYCLATE 100 MG PO CAPS: 100.0000 mg | ORAL_CAPSULE | Freq: Two times a day (BID) | ORAL | 0 refills | Status: AC

## 2020-11-01 MED ORDER — MORPHINE SULFATE (PF) 4 MG/ML IV SOLN
4.0000 mg | Freq: Once | INTRAVENOUS | Status: AC
  Administered 2020-11-01: 4 mg via INTRAVENOUS
  Filled 2020-11-01: qty 1

## 2020-11-01 MED ORDER — ONDANSETRON HCL 4 MG/2ML IJ SOLN
4.0000 mg | Freq: Once | INTRAMUSCULAR | Status: AC
  Administered 2020-11-01: 4 mg via INTRAVENOUS
  Filled 2020-11-01: qty 2

## 2020-11-01 MED ORDER — LIDOCAINE-EPINEPHRINE 1 %-1:100000 IJ SOLN
INTRAMUSCULAR | Status: AC
  Filled 2020-11-01: qty 1

## 2020-11-01 MED ORDER — DOXYCYCLINE HYCLATE 100 MG PO TABS
100.0000 mg | ORAL_TABLET | Freq: Once | ORAL | Status: AC
  Administered 2020-11-01: 100 mg via ORAL
  Filled 2020-11-01: qty 1

## 2020-11-01 MED ORDER — SODIUM CHLORIDE 0.9 % IV BOLUS
1000.0000 mL | Freq: Once | INTRAVENOUS | Status: AC
  Administered 2020-11-01: 1000 mL via INTRAVENOUS

## 2020-11-01 NOTE — Progress Notes (Signed)
Patient presents to the ED secondary to a right breast abscess.  This was aspirated by IR per protocol as he was unable to get into the breast center today or tomorrow for aspiration.  He is AF with a normal WBC.  No evidence of sepsis.  He is going to be discharged on oral abx therapy.  The breast center is currently arranging follow up for repeat US.  No needs for surgical intervention at this time per protocol.  He may follow up with [place] in our office as deemed necessary by the breast center.  He denies any family history of breast cancer in his family except for a great great grandmother.  No immediate family members with breast cancer.  He is stable for DC home.  Letha Cape 3:57 PM 11/01/2020

## 2020-11-01 NOTE — ED Provider Notes (Signed)
[facility] EMERGENCY DEPARTMENT Provider Note   CSN: [id number] Arrival date & time: 11/01/20  4098     History Chief Complaint  Patient presents with   Abdominal Pain    Gregory Grant is a 54 y.o. male with PMH/o HIV, GERD, who presents for evaluation of abdominal pain.  Reports that approximately 4 AM this morning, he was woken up by abdominal pain.  He states it hurts throughout but mostly in the epigastric region.  He describes it as a sharp pain.  He states he had several episodes of nausea/vomiting and diarrhea since onset.  No blood in emesis or stools.  He states that he has not noted any fevers, dysuria or hematuria.  While on EMS, he started having some chest pain.  He states is mostly around the wound to his chest.  He states a few weeks ago, he started developing an abscess to his right chest.  He had an I&D done at urgent care and was sent home.  He states he has been changing the bandage and been doing topical antibiotics.  He does not have any oral antibiotics.  He does not have any shortness of breath currently.  He denies any recent fevers, chills, cough.  He does have a history of HIV and states he is compliant with medications.  He follows with ID every 6 months. His last CD4 count was in August 2021 and was in the 900s.   The history is provided by the patient.       Past Medical History:  Diagnosis Date   Coughing 02/2015   GERD (gastroesophageal reflux disease)    HIV (human immunodeficiency virus infection) [facility])     Patient Active Problem List   Diagnosis Date Noted   Acute ST elevation myocardial infarction (STEMI) of inferolateral wall (HCC) 06/24/2019    Class: Hospitalized for   Coronary artery disease involving native coronary artery of native heart with unstable angina pectoris (HCC) 06/24/2019   Acute ST elevation myocardial infarction (STEMI) involving right coronary artery (HCC) 06/24/2019   Medication monitoring  encounter 06/21/2018   Alcohol use 03/30/2018   Right hip pain 03/30/2018   Hyperlipidemia with target low density lipoprotein (LDL) cholesterol less than 70 mg/dL 11/91/4782   Screening examination for venereal disease 12/22/2017   Healthcare maintenance 12/22/2017   Human immunodeficiency virus (HIV) disease (HCC) 04/13/2017   Tobacco abuse counseling 04/13/2017   Prediabetes 03/16/2015   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/14/2015    Past Surgical History:  Procedure Laterality Date   CORONARY STENT INTERVENTION N/A 06/24/2019   Procedure: CORONARY STENT INTERVENTION;  Surgeon: Marykay Lex, MD;  Location: [facility] INVASIVE CV LAB;  Service: Cardiovascular;  Laterality: N/A;   CORONARY/GRAFT ACUTE MI REVASCULARIZATION N/A 06/24/2019   Procedure: Coronary/Graft Acute MI Revascularization;  Surgeon: Marykay Lex, MD;  Location: [facility] INVASIVE CV LAB;  Service: Cardiovascular;  Laterality: N/A;   LEFT HEART CATH AND CORONARY ANGIOGRAPHY N/A 06/24/2019   Procedure: LEFT HEART CATH AND CORONARY ANGIOGRAPHY;  Surgeon: Marykay Lex, MD;  Location: [facility] INVASIVE CV LAB;  Service: Cardiovascular;  Laterality: N/A;       Family History  Problem Relation Age of Onset   Dementia Mother    Diabetes Father     Social History   Tobacco Use   Smoking status: Current Every Day Smoker    Packs/day: 1.00    Years: 30.00    Pack years: 30.00    Types: Cigarettes  Smokeless tobacco: Never Used  Substance Use Topics   Alcohol use: Yes    Alcohol/week: 2.0 standard drinks    Types: 2 Cans of beer per week   Drug use: No    Home Medications Prior to Admission medications   Medication Sig Start Date End Date Taking? Authorizing Provider  aspirin EC 81 MG tablet Take 1 tablet (81 mg total) by mouth daily. 10/11/20  Yes Fulp, Cammie, MD  atorvastatin (LIPITOR) 80 MG tablet Take 1 tablet (80 mg total) by mouth daily. 10/11/20  Yes Fulp, Cammie, MD  BIKTARVY 50-200-25 MG TABS  tablet TAKE 1 TABLET BY MOUTH DAILY 08/21/20  Yes Comer, Belia Hemanobert W, MD  famotidine (PEPCID) 20 MG tablet Take 1 tablet (20 mg total) by mouth 2 (two) times daily. To protect stomach 10/11/20  Yes Fulp, Cammie, MD  ibuprofen (ADVIL) 600 MG tablet Take 1 tablet (600 mg total) by mouth every 8 (eight) hours as needed. For pain; take after eating 10/11/20  Yes Fulp, Cammie, MD  losartan (COZAAR) 50 MG tablet Take 1 tablet (50 mg total) by mouth daily. To lower blood pressure 10/11/20  Yes Fulp, Cammie, MD  amoxicillin-clavulanate (AUGMENTIN) 500-125 MG tablet Take 1 tablet (500 mg total) by mouth 2 (two) times daily. Take after eating Patient not taking: Reported on 11/01/2020 10/11/20   Fulp, Hewitt Shortsammie, MD  doxycycline (VIBRAMYCIN) 100 MG capsule Take 1 capsule (100 mg total) by mouth 2 (two) times daily for 7 days. 11/01/20 11/08/20  Maxwell CaulLayden, Jacqualine Weichel A, PA-C    Allergies    Patient has no known allergies.  Review of Systems   Review of Systems  Constitutional: Negative for fever.  Respiratory: Negative for cough and shortness of breath.   Cardiovascular: Positive for chest pain.  Gastrointestinal: Positive for abdominal pain, nausea and vomiting.  Genitourinary: Negative for dysuria and hematuria.  Neurological: Negative for headaches.  All other systems reviewed and are negative.   Physical Exam Updated Vital Signs BP (!) 129/95    Pulse 93    Temp 97.9 F (36.6 C) (Oral)    Resp 18    Ht 5\' 6"  (1.676 m)    Wt 86.2 kg    SpO2 93%    BMI 30.67 kg/m   Physical Exam Vitals and nursing note reviewed.  Constitutional:      Appearance: Normal appearance. He is well-developed.  HENT:     Head: Normocephalic and atraumatic.  Eyes:     General: Lids are normal.     Conjunctiva/sclera: Conjunctivae normal.     Pupils: Pupils are equal, round, and reactive to light.  Cardiovascular:     Rate and Rhythm: Regular rhythm. Bradycardia present.     Pulses: Normal pulses.     Heart sounds: Normal  heart sounds. No murmur heard.  No friction rub. No gallop.   Pulmonary:     Effort: Pulmonary effort is normal.     Breath sounds: Normal breath sounds.  Chest:       Comments: He has erythema, induration noted around the right areola with a eraser size open wound that is actively draining purulent drainage. The area is tender.  Abdominal:     Palpations: Abdomen is soft. Abdomen is not rigid.     Tenderness: There is abdominal tenderness. There is no right CVA tenderness, left CVA tenderness or guarding.     Comments: Abdomen soft, nondistended.  Tenderness palpation noted diffusely, most prominent in the epigastric though.  No rigidity, guarding.  No CVA tenderness noted bilaterally.  Musculoskeletal:        General: Normal range of motion.     Cervical back: Full passive range of motion without pain.  Skin:    General: Skin is warm and dry.     Capillary Refill: Capillary refill takes less than 2 seconds.  Neurological:     Mental Status: He is alert and oriented to person, place, and time.  Psychiatric:        Speech: Speech normal.     ED Results / Procedures / Treatments   Labs (all labs ordered are listed, but only abnormal results are displayed) Labs Reviewed  COMPREHENSIVE METABOLIC PANEL - Abnormal; Notable for the following components:      Result Value   Glucose, Bld 125 (*)    All other components within normal limits  CBC WITH DIFFERENTIAL/PLATELET - Abnormal; Notable for the following components:   MCV 100.2 (*)    MCH 34.2 (*)    All other components within normal limits  URINALYSIS, ROUTINE W REFLEX MICROSCOPIC - Abnormal; Notable for the following components:   Ketones, ur 5 (*)    All other components within normal limits  AEROBIC/ANAEROBIC CULTURE (SURGICAL/DEEP WOUND)  AEROBIC/ANAEROBIC CULTURE (SURGICAL/DEEP WOUND)  LIPASE, BLOOD  TROPONIN I (HIGH SENSITIVITY)  TROPONIN I (HIGH SENSITIVITY)    EKG EKG Interpretation  Date/Time:  Thursday  November 01 2020 08:26:53 EST Ventricular Rate:  48 PR Interval:    QRS Duration: 84 QT Interval:  457 QTC Calculation: 409 R Axis:   -35 Text Interpretation: Sinus bradycardia Probable left atrial enlargement Left axis deviation Abnormal R-wave progression, early transition No STEMI Confirmed by Alvester Chou [phone]) on 11/01/2020 8:33:33 AM   Radiology DG Chest 2 View  Result Date: 11/01/2020 CLINICAL DATA:  Wound to chest. Patient has a wound to the right side of the chest from a knot that was recently biopsied. History of HIV. EXAM: CHEST - 2 VIEW COMPARISON:  None. FINDINGS: Cardiac silhouette is normal in size. No mediastinal or hilar masses or evidence of adenopathy. Clear lungs.  No pleural effusion or pneumothorax. Skeletal structures are intact. No visualized soft tissue mass or air. IMPRESSION: No active cardiopulmonary disease. Electronically Signed   By: Amie Portland M.D.   On: 11/01/2020 09:08    Procedures Procedures (including critical care time)  Medications Ordered in ED Medications  lidocaine-EPINEPHrine (XYLOCAINE W/EPI) 1 %-1:100000 (with pres) injection (has no administration in time range)  morphine 4 MG/ML injection 4 mg (4 mg Intravenous Given 11/01/20 0917)  ondansetron (ZOFRAN) injection 4 mg (4 mg Intravenous Given 11/01/20 0913)  sodium chloride 0.9 % bolus 1,000 mL (0 mLs Intravenous Stopped 11/01/20 1122)  doxycycline (VIBRA-TABS) tablet 100 mg (100 mg Oral Given 11/01/20 1600)    ED Course  I have reviewed the triage vital signs and the nursing notes.  Pertinent labs & imaging results that were available during my care of the patient were reviewed by me and considered in my medical decision making (see chart for details).  Clinical Course as of Nov 01 1605  Thu Nov 01, 2020  1331 This is a 54 year old male present emergency department multiple complaints,, specifically a persistent right breast abscess, that was percutaneously drained with needle  aspiration a few weeks ago, for which she completed a course of antibiotics, but continues to cause him pain.  He said it began by scraping his breast with a 2 x 4 piece of wood.  Despite antibiotics and  drainage, continues to have pain, swelling, and drainage from this area.  On exam he does have a region of induration and tenderness and fluctuance directly behind the right areola.  Bedside ultrasound shows a 1.5 x 1.5 cm hypoechoic collection behind the areola consistent with abscess.  Given the location of this abscess, the PA provider has been attempting to reach out to specialists regarding drainage.  It appears that the IR team will attempt to drain the abscess.  We will then put the patient on 7 days of doxycycline.   [MT]  1332 Otherwise the patient has been mildly hypertensive mildly bradycardic while here in the emergency department.  Is been sleeping for most of his stay.  Do not suspect these are acute abnormalities.  His work-up occluding troponins are unremarkable here today.  He has no signs or symptoms of sepsis.   [MT]    Clinical Course User Index [MT] Terald Sleeper, MD   MDM Rules/Calculators/A&P                          54 year old male who presents for evaluation of abdominal pain that began at 4 AM this morning.  Reports associated nausea/vomiting/diarrhea.  He states that he had some chest pain as well.  He has an abscess on his right breast area that had a needle aspiration done by urgent care a few weeks ago.  He states he was on some antibiotics at the time and finish them.  He states that he was post follow-up breast intermedius unable to get in with them yet.  He states that his continued drain and that the holes gotten bigger.  He states that he has some pain around that area and states that some of chest pain is from that area.  On initially arrival, he is afebrile nontoxic-appearing.  He is slightly bradycardic.  Vitals otherwise stable.  On exam, he has diffuse  abdominal tenderness.  No focal point.  Plan to check labs.  UA negative for any infectious etiology.  Troponin is negative.  Lipase is unremarkable.  CMP shows normal BUN and creatinine.  CBC shows no leukocytosis.  Bedside ultrasound done by ED attending showed a approximately 2 x 2.5 cm abscess that is directly behind the areola.  There is a small opening noted at the 12:00 region but the abscess pocket itself is directed behind the L areola.  At this time, given its location, do not feel that bedside I&D is appropriate.  Will call GEN surge.  I discussed with Leary Roca (Gen Surg PA) who recommends we call the breast center for evaluation to see if we get them in either today or tomorrow.  I discussed up with the breast center.  At this time, they are booked till December 03, 2020.  They would not be able to get him in until then.  Discussed with Dr. Grace Isaac (IR) about possible ultrasound-guided aspiration.  He will take the patient to the IR suite to do ultrasound-guided aspiration.  He is discussed with the breast center who will see patient in follow-up.  Patient will be brought back to the ED and to be discharged with doxycycline.  Updated patient on plan.  He is resting comfortably on bed with no signs of distress.  Repeat abdominal exam is benign.  He states his pain has improved.  We will plan to p.o. challenge.  At this time, given his reassuring lab work, improvement in pain and reassuring  exam, do not feel he needs a CT on pelvis is do not suspect surgical abdomen.  Discussed with Dr. Grace Isaac (IR).  He was able to drain 3 cc of purulent drainage.  He also noticed another small wound and expressed some dead tissue.  He said he had looked through his previous CT scans and he had had gynecomastia cysts before.  He recommended GEN surge evaluate patient in the ED.  He states that he has contacted breast center and they will plan to follow-up with him.  Discussed with GEN surge PA.  She will  evaluate patient in the ED.  General surgery has evaluated patient.  They recommend discharge home with antibiotics.  Reevaluation.  Patient resting comfortably.  He has not any vomiting and has been able to tolerate p.o. here in the ED.  We will send him home on doxycycline.  Patient instructed to follow-up with breast center as well as GEN surge.  Repeat abdominal exam is benign.  He denies any abdominal pain.  He is hemodynamically stable here in the ED.  Patient will be sent home with doxycycline.  Instructed to follow-up as directed. At this time, patient exhibits no emergent life-threatening condition that require further evaluation in ED. Patient had ample opportunity for questions and discussion. All patient's questions were answered with full understanding. Strict return precautions discussed. Patient expresses understanding and agreement to plan.   Portions of this note were generated with Scientist, clinical (histocompatibility and immunogenetics). Dictation errors may occur despite best attempts at proofreading.   Final Clinical Impression(s) / ED Diagnoses Final diagnoses:  Breast abscess  Generalized abdominal pain    Rx / DC Orders ED Discharge Orders         Ordered    doxycycline (VIBRAMYCIN) 100 MG capsule  2 times daily        11/01/20 1554           Rosana Hoes 11/01/20 1606    Terald Sleeper, MD 11/01/20 [id number]

## 2020-11-01 NOTE — Procedures (Signed)
Pre Procedure Dx: Right breast abscess Post Procedural Dx: Same  Technically successful US guided aspiration of approximately 0.5 cc of purulent fluid from subareolar right breast abscess -> aspirated fluid capped and sent to lab for analysis.   Small amount of purulent fluid was expressed from 2 small circular open wounds superior to the nipple during the US guided aspiration.  Following the US guided aspiration approximately 3 cc of necrotic phlegmonous looking tissue was expressed from the more inferior circular open wound above the nipple -> expressed tissue was placed in saline and submitted to the lab for analysis.   EBL: None No immediate complications.   Katherina Right, MD Pager #: [phone]

## 2020-11-01 NOTE — Discharge Instructions (Addendum)
Take antibiotics as directed. Please take all of your antibiotics until finished.  You can take Tylenol or Ibuprofen as directed for pain. You can alternate Tylenol and Ibuprofen every 4 hours. If you take Tylenol at 1pm, then you can take Ibuprofen at 5pm. Then you can take Tylenol again at 9pm.   Follow up with the breast center of [place]. Also follow up with the referred general surgery team.   Return to the Emergency Department immediately if you experience any worsening abdominal pain, fever, persistent nausea and vomiting, inability keep any food down, pain with urination, blood in your urine or any other worsening or concerning symptoms.

## 2020-11-01 NOTE — ED Triage Notes (Signed)
Pt comes from home via EMS reports abdominal pain woke him up around 0400. EMS reports nausea and abdominal pain with EMS, they report sudden onset of chest tightness. EMS reports wound to left chest that was I and D'd at urgent care. States "I had a knot there and they stuck a needle in it". Wound is currently dressed with guaze and masking tape as well as antibiotic ointment by pt. Pt also reports "slight headache".  EMS reports that pt has had similar abdominal pain aprox 1 year ago and treated in this ED for same.NoIV or meds given.

## 2020-11-01 NOTE — ED Notes (Signed)
Got patient into a gown on the monitor did ekg shown to the er doctor patient is resting with call bell in reach 

## 2020-11-06 LAB — AEROBIC/ANAEROBIC CULTURE W GRAM STAIN (SURGICAL/DEEP WOUND)

## 2020-11-07 ENCOUNTER — Telehealth: Payer: Self-pay | Admitting: *Deleted

## 2020-11-07 NOTE — Progress Notes (Signed)
ED Antimicrobial Stewardship Positive Culture Follow Up   Hisashi Amadon is an 54 y.o. male who presented to [facility] on 11/01/2020 with a chief complaint of abdominal pain and pain around chest wound (s/p recent breast abscess I&D).  Chief Complaint  Patient presents with  . Abdominal Pain    Recent Results (from the past 720 hour(s))  Aerobic/Anaerobic Culture (surgical/deep wound)     Status: None   Collection Time: 10/18/20  5:15 PM   Specimen: Abscess  Result Value Ref Range Status   Specimen Description ABSCESS  Final   Special Requests Normal  Final   Gram Stain   Final    ABUNDANT WBC PRESENT, PREDOMINANTLY PMN ABUNDANT GRAM POSITIVE COCCI MODERATE GRAM POSITIVE RODS Performed at [facility], [address] [place], [place] [address]    Culture   Final    MIXED ANAEROBIC FLORA PRESENT.  CALL LAB IF FURTHER IID REQUIRED.   Report Status 10/21/2020 FINAL  Final  Aerobic/Anaerobic Culture (surgical/deep wound)     Status: None   Collection Time: 11/01/20  3:29 PM   Specimen: Abscess  Result Value Ref Range Status   Specimen Description ABSCESS RIGHT BREAST  Final   Special Requests SYRINGE  Final   Gram Stain   Final    RARE WBC PRESENT, PREDOMINANTLY PMN RARE GRAM POSITIVE COCCI IN CLUSTERS    Culture   Final    RARE DIPHTHEROIDS(CORYNEBACTERIUM SPECIES) RARE PEPTOSTREPTOCOCCUS SPECIES Standardized susceptibility testing for this organism is not available. Performed at [facility], [address], [place], [place] [address]    Report Status 11/06/2020 FINAL  Final  Aerobic/Anaerobic Culture (surgical/deep wound)     Status: None   Collection Time: 11/01/20  3:29 PM   Specimen: Right; Tissue  Result Value Ref Range Status   Specimen Description RIGHT BREAST  Final   Special Requests NONE  Final   Culture   Final    TEST WILL BE CREDITED Performed at [facility], [address] [place], [place] [address]    Report Status 11/06/2020  FINAL  Final    [x]  Treated with doxycycline, organism resistant to prescribed antimicrobial  Stop doxycycline. Start Augmentin 875mg  by mouth twice daily for 7 days.   ED Provider: , PA-C   11/07/2020, 9:33 AM Clinical Pharmacist Monday - Friday phone -  [phone] Saturday - Sunday phone - [phone]

## 2020-11-07 NOTE — Telephone Encounter (Signed)
Post ED Visit - Positive Culture Follow-up: Unsuccessful Patient Follow-up  Culture assessed and recommendations reviewed by:  []  , Pharm.D. []  Enzo Bi, Pharm.D., BCPS AQ-ID []  , Pharm.D., BCPS []  Celedonio Miyamoto, Pharm.D., BCPS []  [place], Garvin Fila.D., BCPS, AAHIVP []  , Pharm.D., BCPS, AAHIVP []  Georgina Pillion, PharmD []  , PharmD, BCPS  Positive wound culture  []  Patient discharged without antimicrobial prescription and treatment is now indicated [x]  Organism is resistant to prescribed ED discharge antimicrobial []  Patient with positive blood cultures  Plan:  Stop Doxycycline.  Start Augmentin 875 mg PO q12hrs x 7 days, [place], PA-C  Unable to contact patient after 3 attempts, letter will be sent to address on file  [address] 11/07/2020, 10:39 AM

## 2020-11-08 ENCOUNTER — Telehealth: Payer: Self-pay | Admitting: *Deleted

## 2020-12-24 ENCOUNTER — Other Ambulatory Visit: Payer: Self-pay

## 2020-12-24 ENCOUNTER — Emergency Department (HOSPITAL_COMMUNITY)
Admission: EM | Admit: 2020-12-24 | Discharge: 2020-12-25 | Disposition: A | Discharge status: Home / Self Care | Payer: Self-pay | Attending: Emergency Medicine | Admitting: Emergency Medicine

## 2020-12-24 ENCOUNTER — Ambulatory Visit (HOSPITAL_COMMUNITY)
Admission: EM | Admit: 2020-12-24 | Discharge: 2020-12-24 | Disposition: A | Discharge status: Home / Self Care | Payer: Self-pay | Attending: Family Medicine | Admitting: Family Medicine

## 2020-12-24 ENCOUNTER — Encounter (HOSPITAL_COMMUNITY): Payer: Self-pay

## 2020-12-24 ENCOUNTER — Emergency Department (HOSPITAL_COMMUNITY): Payer: Self-pay

## 2020-12-24 DIAGNOSIS — K219 Gastro-esophageal reflux disease without esophagitis: Secondary | ICD-10-CM | POA: Insufficient documentation

## 2020-12-24 DIAGNOSIS — R079 Chest pain, unspecified: Secondary | ICD-10-CM

## 2020-12-24 DIAGNOSIS — Z8679 Personal history of other diseases of the circulatory system: Secondary | ICD-10-CM

## 2020-12-24 DIAGNOSIS — R112 Nausea with vomiting, unspecified: Secondary | ICD-10-CM | POA: Insufficient documentation

## 2020-12-24 DIAGNOSIS — Z20822 Contact with and (suspected) exposure to covid-19: Secondary | ICD-10-CM | POA: Insufficient documentation

## 2020-12-24 DIAGNOSIS — R0789 Other chest pain: Secondary | ICD-10-CM | POA: Insufficient documentation

## 2020-12-24 DIAGNOSIS — R1033 Periumbilical pain: Secondary | ICD-10-CM | POA: Insufficient documentation

## 2020-12-24 DIAGNOSIS — B20 Human immunodeficiency virus [HIV] disease: Secondary | ICD-10-CM | POA: Insufficient documentation

## 2020-12-24 DIAGNOSIS — I2510 Atherosclerotic heart disease of native coronary artery without angina pectoris: Secondary | ICD-10-CM | POA: Insufficient documentation

## 2020-12-24 DIAGNOSIS — Z9861 Coronary angioplasty status: Secondary | ICD-10-CM | POA: Insufficient documentation

## 2020-12-24 DIAGNOSIS — F17210 Nicotine dependence, cigarettes, uncomplicated: Secondary | ICD-10-CM | POA: Insufficient documentation

## 2020-12-24 DIAGNOSIS — Z79899 Other long term (current) drug therapy: Secondary | ICD-10-CM | POA: Insufficient documentation

## 2020-12-24 DIAGNOSIS — Z7982 Long term (current) use of aspirin: Secondary | ICD-10-CM | POA: Insufficient documentation

## 2020-12-24 LAB — CBC
HCT: 48.8 % (ref 39.0–52.0)
Hemoglobin: 16.8 g/dL (ref 13.0–17.0)
MCH: 34.3 pg — ABNORMAL HIGH (ref 26.0–34.0)
MCHC: 34.4 g/dL (ref 30.0–36.0)
MCV: 99.6 fL (ref 80.0–100.0)
Platelets: 196 10*3/uL (ref 150–400)
RBC: 4.9 MIL/uL (ref 4.22–5.81)
RDW: 13.7 % (ref 11.5–15.5)
WBC: 7.8 10*3/uL (ref 4.0–10.5)
nRBC: 0 % (ref 0.0–0.2)

## 2020-12-24 LAB — BASIC METABOLIC PANEL
Anion gap: 15 (ref 5–15)
BUN: 11 mg/dL (ref 6–20)
CO2: 22 mmol/L (ref 22–32)
Calcium: 9.4 mg/dL (ref 8.9–10.3)
Chloride: 96 mmol/L — ABNORMAL LOW (ref 98–111)
Creatinine, Ser: 1.32 mg/dL — ABNORMAL HIGH (ref 0.61–1.24)
GFR, Estimated: >60 mL/min (ref >60)
Glucose, Bld: 112 mg/dL — ABNORMAL HIGH (ref 70–99)
Potassium: 3.5 mmol/L (ref 3.5–5.1)
Sodium: 133 mmol/L — ABNORMAL LOW (ref 135–145)

## 2020-12-24 LAB — TROPONIN I (HIGH SENSITIVITY)
Troponin I (High Sensitivity): 9 ng/L (ref <18)
Troponin I (High Sensitivity): 8 ng/L (ref <18)

## 2020-12-24 MED ORDER — SODIUM CHLORIDE 0.9 % IV BOLUS
1000.0000 mL | Freq: Once | INTRAVENOUS | Status: AC
  Administered 2020-12-24: 1000 mL via INTRAVENOUS

## 2020-12-24 MED ORDER — ASPIRIN 325 MG PO TABS
325.0000 mg | ORAL_TABLET | Freq: Every day | ORAL | Status: DC
  Administered 2020-12-24: 325 mg via ORAL
  Filled 2020-12-24: qty 1

## 2020-12-24 NOTE — ED Triage Notes (Signed)
Pt presents with mid-sternum chest pain px4 days. Pt also reports his food gets stuck just below his throat when he eats. Also pt can not lay all the way down to sleep without balling a fist on top of his chest

## 2020-12-24 NOTE — ED Notes (Signed)
Patient is being discharged from the Urgent Care and sent to the Emergency Department via pov. Per Wendee Beavers, NP, patient is in need of higher level of care due to active CP 9/10. Patient is aware and verbalizes understanding of plan of care.  Vitals:   12/24/20 1048  BP: (!) 126/100  Pulse: (!) 118  Resp: 18  Temp: 98.3 F (36.8 C)  SpO2: 98%

## 2020-12-24 NOTE — ED Provider Notes (Signed)
[facility] EMERGENCY DEPARTMENT Provider Note   CSN: [id number] Arrival date & time: 12/24/20  1102     History Chief Complaint  Patient presents with   Chest Pain    Gregory Grant is a 56 y.o. male with a past medical history significant for HIV, GERD, CAD, hyperlipidemia who presents to the ED due to central, substernal, nonradiating chest pain x3 days.  Patient states symptoms started with abdominal pain around his umbilicus associated with numerous episodes of nonbloody, nonbilious emesis.  Patient states he is unable to tolerate any p.o.  He notes he woke up Saturday morning with chest pain that is worse with exertion. Denies shortness of breath. Notes chest pain feels similar to his past STEMI.  Denies sick contacts or known Covid exposures.  He has received both his Covid vaccines however, no booster shot.  Denies fever and chills.  Chart reviewed.  Patient had a STEMI in August 2020 with 2 DES placed to mid and distal RCA.  Patient denies history of blood clots, recent surgeries, hormonal treatments, and trauma.  He admits to taking a 6 hour round trip on Thursday prior to onset of symptoms. No previous abdominal operations. No treatment prior to arrival. Admits to daily tobacco use. Denies drug use.   History obtained from patient and past medical records. No interpreter used during encounter.       Past Medical History:  Diagnosis Date   Coughing 02/2015   GERD (gastroesophageal reflux disease)    HIV (human immunodeficiency virus infection) [facility])     Patient Active Problem List   Diagnosis Date Noted   Acute ST elevation myocardial infarction (STEMI) of inferolateral wall (HCC) 06/24/2019    Class: Hospitalized for   Coronary artery disease involving native coronary artery of native heart with unstable angina pectoris (HCC) 06/24/2019   Acute ST elevation myocardial infarction (STEMI) involving right coronary artery (HCC) 06/24/2019    Medication monitoring encounter 06/21/2018   Alcohol use 03/30/2018   Right hip pain 03/30/2018   Hyperlipidemia with target low density lipoprotein (LDL) cholesterol less than 70 mg/dL 83/15/1761   Screening examination for venereal disease 12/22/2017   Healthcare maintenance 12/22/2017   Human immunodeficiency virus (HIV) disease (HCC) 04/13/2017   Tobacco abuse counseling 04/13/2017   Prediabetes 03/16/2015   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/14/2015    Past Surgical History:  Procedure Laterality Date   CORONARY STENT INTERVENTION N/A 06/24/2019   Procedure: CORONARY STENT INTERVENTION;  Surgeon: Marykay Lex, MD;  Location: [facility] INVASIVE CV LAB;  Service: Cardiovascular;  Laterality: N/A;   CORONARY/GRAFT ACUTE MI REVASCULARIZATION N/A 06/24/2019   Procedure: Coronary/Graft Acute MI Revascularization;  Surgeon: Marykay Lex, MD;  Location: [facility] INVASIVE CV LAB;  Service: Cardiovascular;  Laterality: N/A;   LEFT HEART CATH AND CORONARY ANGIOGRAPHY N/A 06/24/2019   Procedure: LEFT HEART CATH AND CORONARY ANGIOGRAPHY;  Surgeon: Marykay Lex, MD;  Location: [facility] INVASIVE CV LAB;  Service: Cardiovascular;  Laterality: N/A;       Family History  Problem Relation Age of Onset   Dementia Mother    Diabetes Father     Social History   Tobacco Use   Smoking status: Current Every Day Smoker    Packs/day: 1.00    Years: 30.00    Pack years: 30.00    Types: Cigarettes   Smokeless tobacco: Never Used  Substance Use Topics   Alcohol use: Yes    Alcohol/week: 2.0 standard drinks    Types:  2 Cans of beer per week   Drug use: No    Home Medications Prior to Admission medications   Medication Sig Start Date End Date Taking? Authorizing Provider  amoxicillin-clavulanate (AUGMENTIN) 500-125 MG tablet Take 1 tablet (500 mg total) by mouth 2 (two) times daily. Take after eating Patient not taking: Reported on 11/01/2020 10/11/20   Cain Saupe, MD  aspirin EC 81 MG  tablet Take 1 tablet (81 mg total) by mouth daily. 10/11/20   Fulp, Cammie, MD  atorvastatin (LIPITOR) 80 MG tablet Take 1 tablet (80 mg total) by mouth daily. 10/11/20   Fulp, Hewitt Shorts, MD  BIKTARVY 50-200-25 MG TABS tablet TAKE 1 TABLET BY MOUTH DAILY 08/21/20   Comer, Belia Heman, MD  famotidine (PEPCID) 20 MG tablet Take 1 tablet (20 mg total) by mouth 2 (two) times daily. To protect stomach 10/11/20   Fulp, Cammie, MD  ibuprofen (ADVIL) 600 MG tablet Take 1 tablet (600 mg total) by mouth every 8 (eight) hours as needed. For pain; take after eating 10/11/20   Fulp, Cammie, MD  losartan (COZAAR) 50 MG tablet Take 1 tablet (50 mg total) by mouth daily. To lower blood pressure 10/11/20   Fulp, Cammie, MD    Allergies    Patient has no known allergies.  Review of Systems   Review of Systems  Constitutional: Negative for chills and fever.  Respiratory: Negative for cough and shortness of breath.   Cardiovascular: Positive for chest pain. Negative for leg swelling.  Gastrointestinal: Positive for abdominal pain, nausea and vomiting. Negative for diarrhea.  Genitourinary: Negative for dysuria.  Musculoskeletal: Negative for back pain.  Neurological: Negative for headaches.  All other systems reviewed and are negative.   Physical Exam Updated Vital Signs BP 115/82    Pulse (!) 107    Temp 98.9 F (37.2 C) (Oral)    Resp 18    SpO2 97%   Physical Exam Vitals and nursing note reviewed.  Constitutional:      General: He is not in acute distress.    Appearance: He is not ill-appearing.  HENT:     Head: Normocephalic.  Eyes:     Pupils: Pupils are equal, round, and reactive to light.  Cardiovascular:     Rate and Rhythm: Normal rate and regular rhythm.     Pulses: Normal pulses.     Heart sounds: Normal heart sounds. No murmur heard. No friction rub. No gallop.   Pulmonary:     Effort: Pulmonary effort is normal.     Breath sounds: Normal breath sounds.  Chest:     Comments:  Reproducible anterior chest wall tenderness.  No crepitus or deformity. Abdominal:     General: Abdomen is flat. Bowel sounds are normal. There is no distension.     Palpations: Abdomen is soft.     Tenderness: There is no abdominal tenderness. There is no guarding or rebound.     Comments: Abdomen soft, nondistended, nontender to palpation in all quadrants without guarding or peritoneal signs. No rebound.   Musculoskeletal:     Cervical back: Neck supple.     Comments: No lower extremity edema. Negative homan sign bilaterally.  Skin:    General: Skin is warm and dry.  Neurological:     General: No focal deficit present.     Mental Status: He is alert.  Psychiatric:        Mood and Affect: Mood normal.        Behavior: Behavior normal.  ED Results / Procedures / Treatments   Labs (all labs ordered are listed, but only abnormal results are displayed) Labs Reviewed  BASIC METABOLIC PANEL - Abnormal; Notable for the following components:      Result Value   Sodium 133 (*)    Chloride 96 (*)    Glucose, Bld 112 (*)    Creatinine, Ser 1.32 (*)    All other components within normal limits  CBC - Abnormal; Notable for the following components:   MCH 34.3 (*)    All other components within normal limits  D-DIMER, QUANTITATIVE (NOT AT [facility])  HEPATIC FUNCTION PANEL  LIPASE, BLOOD  TROPONIN I (HIGH SENSITIVITY)  TROPONIN I (HIGH SENSITIVITY)    EKG None  Radiology No results found.  Procedures Procedures (including critical care time)  Medications Ordered in ED Medications  sodium chloride 0.9 % bolus 1,000 mL (has no administration in time range)    ED Course  I have reviewed the triage vital signs and the nursing notes.  Pertinent labs & imaging results that were available during my care of the patient were reviewed by me and considered in my medical decision making (see chart for details).    MDM Rules/Calculators/A&P                         56-year-old male  with a history of STEMI in July 2020 with placement of 2 DES to right RCA presents to the ED due to chest pain and nonbloody, nonbilious emesis x3 days.  States pain feels similar to his past STEMI.  Pain worse with exertion.  Unfortunately, patient waited over 12 hours prior to my initial evaluation.  Patient in no acute distress and nontoxic-appearing.  Patient mildly tachycardic throughout his wait time. Recently took a 6 hour round trip on Thursday. Denies history of PE/DVT.  Physical exam reassuring.  Abdomen soft, nondistended, nontender.  Low suspicion for acute abdomen.  Reproducible anterior chest wall tenderness without crepitus or deformity.  No lower extremity edema.  Negative Homan sign bilaterally.  Troponin, CBC, BMP ordered at triage.  Hepatic function, lipase, and UA added due to numerous episodes of nonbloody, nonbilious emesis associated with abdominal pain.  Covid test ordered to rule out Covid infection.  D-dimer due tachycardia to rule out PE.  ASA 325 given after initial evaluation due to ongoing chest pain.  Patient notes he has not taken his ASA 81 mg today.  CBC reassuring with no leukocytosis and normal hemoglobin.  Delta troponin flat.  Low suspicion for ACS.  BMP significant for hyponatremia 133, mild hyperglycemia at 112, elevated creatinine at 1.32.  No anion gap.  Doubt DKA.  IV fluids given here in the ED. EKG personally reviewed which demonstrates sinus tachycardia with no signs of acute ischemia.  Chest x-ray personally reviewed which is negative for signs of pneumonia, pneumothorax, or widened mediastinum.  Patient signed out to Dr. Judd Lien pending labs.  Final Clinical Impression(s) / ED Diagnoses Final diagnoses:  None    Rx / DC Orders ED Discharge Orders    None       Jesusita Oka 12/24/20 2345    Geoffery Lyons, MD 12/25/20 0236

## 2020-12-24 NOTE — ED Triage Notes (Signed)
Pt presents with chest pain x 4 days. Pt states he ate dinner last night and the chest pain became stronger. He states he feels the food is stuck in his chest. Pt denies numbness and tingling.

## 2020-12-25 DIAGNOSIS — R079 Chest pain, unspecified: Secondary | ICD-10-CM

## 2020-12-25 LAB — HEPATIC FUNCTION PANEL
ALT: 16 U/L (ref 0–44)
AST: 17 U/L (ref 15–41)
Albumin: 3.7 g/dL (ref 3.5–5.0)
Alkaline Phosphatase: 64 U/L (ref 38–126)
Bilirubin, Direct: 0.1 mg/dL (ref 0.0–0.2)
Indirect Bilirubin: 1 mg/dL — ABNORMAL HIGH (ref 0.3–0.9)
Total Bilirubin: 1.1 mg/dL (ref 0.3–1.2)
Total Protein: 6.7 g/dL (ref 6.5–8.1)

## 2020-12-25 LAB — SARS CORONAVIRUS 2 (TAT 6-24 HRS): SARS Coronavirus 2: NEGATIVE

## 2020-12-25 LAB — LIPASE, BLOOD: Lipase: 26 U/L (ref 11–51)

## 2020-12-25 MED ORDER — NITROGLYCERIN 0.4 MG SL SUBL: 0.4000 mg | SUBLINGUAL_TABLET | SUBLINGUAL | 0 refills | Status: DC | PRN

## 2020-12-25 MED ORDER — ATORVASTATIN CALCIUM 80 MG PO TABS: 80.0000 mg | ORAL_TABLET | Freq: Every day | ORAL | 1 refills | Status: DC

## 2020-12-25 NOTE — Discharge Instructions (Addendum)
Begin taking atorvastatin as previously prescribed.  This medication has been refilled here today for you.  Sublingual nitroglycerin: Place 1 tablet under the tongue every 5 minutes x 3 if you experience additional chest pain.  If this does not help your pain, return to the emergency department to be reevaluated.  You need to follow-up with the cardiologist in the next few days.  The contact information for the [facility] cardiology clinic has been provided on this discharge summary for you to call and make these arrangements.  Return to the emergency department in the meantime if your symptoms significantly worsen or change.

## 2020-12-25 NOTE — Consult Note (Signed)
[facility] Consult Note   Primary Physician: Patient, No Pcp Per Primary Cardiologist:  Bryan Lemma  Reason for Consultation: Chest pain  HPI:    Gregory Grant is a 55 year old gentleman with a past medical history significant for CAD (with inferior STEMI and PCI of the RCA in July 2020), well-controlled HIV, hyperlipidemia, GERD and tobacco use, who presents with complaints of intermittent chest pain over the past weekend. There was no associated diaphoresis or radiation of the pain.  He however does describe recent abdominal pain and nausea.  In the ED the electrocardiogram was unremarkable and did not show any ischemic changes.  High-sensitivity troponins were negative x2.  It appears that the patient was lost to follow-up after his hospitalization for an MI in 2020.  He has not seen a cardiologist since then.     Home Medications Prior to Admission medications   Medication Sig Start Date End Date Taking? Authorizing Provider  aspirin EC 81 MG tablet Take 1 tablet (81 mg total) by mouth daily. 10/11/20  Yes Fulp, Cammie, MD  atorvastatin (LIPITOR) 80 MG tablet Take 1 tablet (80 mg total) by mouth daily. 12/25/20  Yes Delo, Riley Lam, MD  BIKTARVY 50-200-25 MG TABS tablet TAKE 1 TABLET BY MOUTH DAILY 08/21/20  Yes Comer, Belia Heman, MD  nitroGLYCERIN (NITROSTAT) 0.4 MG SL tablet Place 1 tablet (0.4 mg total) under the tongue every 5 (five) minutes as needed for chest pain. 12/25/20  Yes Delo, Riley Lam, MD  amoxicillin-clavulanate (AUGMENTIN) 500-125 MG tablet Take 1 tablet (500 mg total) by mouth 2 (two) times daily. Take after eating Patient not taking: No sig reported 10/11/20   Cain Saupe, MD    Past Medical History: Past Medical History:  Diagnosis Date   Coughing 02/2015   GERD (gastroesophageal reflux disease)    HIV (human immunodeficiency virus infection) (HCC)     Past Surgical History: Past Surgical History:  Procedure Laterality Date   CORONARY STENT  INTERVENTION N/A 06/24/2019   Procedure: CORONARY STENT INTERVENTION;  Surgeon: Marykay Lex, MD;  Location: [facility] INVASIVE CV LAB;  Service: Cardiovascular;  Laterality: N/A;   CORONARY/GRAFT ACUTE MI REVASCULARIZATION N/A 06/24/2019   Procedure: Coronary/Graft Acute MI Revascularization;  Surgeon: Marykay Lex, MD;  Location: [facility] INVASIVE CV LAB;  Service: Cardiovascular;  Laterality: N/A;   LEFT HEART CATH AND CORONARY ANGIOGRAPHY N/A 06/24/2019   Procedure: LEFT HEART CATH AND CORONARY ANGIOGRAPHY;  Surgeon: Marykay Lex, MD;  Location: [facility] INVASIVE CV LAB;  Service: Cardiovascular;  Laterality: N/A;    Family History: Family History  Problem Relation Age of Onset   Dementia Mother    Diabetes Father     Social History: Social History   Socioeconomic History   Marital status: Legally Separated    Spouse name: Not on file   Number of children: Not on file   Years of education: Not on file   Highest education level: Not on file  Occupational History   Not on file  Tobacco Use   Smoking status: Current Every Day Smoker    Packs/day: 1.00    Years: 30.00    Pack years: 30.00    Types: Cigarettes   Smokeless tobacco: Never Used  Substance and Sexual Activity   Alcohol use: Yes    Alcohol/week: 2.0 standard drinks    Types: 2 Cans of beer per week   Drug use: No   Sexual activity: Yes    Comment: declined condoms  Other  Topics Concern   Not on file  Social History Narrative   Not on file   Social Determinants of Health   Financial Resource Strain: Not on file  Food Insecurity: Not on file  Transportation Needs: Not on file  Physical Activity: Not on file  Stress: Not on file  Social Connections: Not on file    Allergies:  No Known Allergies   Review of Systems: [y] = yes, [ ]  = no   General: Weight gain [ ] ; Weight loss [ ] ; Anorexia [ ] ; Fatigue [ ] ; Fever [ ] ; Chills [ ] ; Weakness [ ]   Cardiac: Chest pain/pressure [Y]; Resting SOB [ ] ; Exertional SOB  [ ] ; Orthopnea [ ] ; Pedal Edema [ ] ; Palpitations [ ] ; Syncope [ ] ; Presyncope [ ] ; Paroxysmal nocturnal dyspnea[ ]   Pulmonary: Cough [ ] ; Wheezing[ ] ; Hemoptysis[ ] ; Sputum [ ] ; Snoring [ ]   GI: Vomiting[ ] ; Dysphagia[ ] ; Melena[ ] ; Hematochezia [ ] ; Heartburn[ ] ; Abdominal pain [Y]; Constipation [ ] ; Diarrhea [ ] ; BRBPR [ ]   GU: Hematuria[ ] ; Dysuria [ ] ; Nocturia[ ]   Vascular: Pain in legs with walking [ ] ; Pain in feet with lying flat [ ] ; Non-healing sores [ ] ; Stroke [ ] ; TIA [ ] ; Slurred speech [ ] ;  Neuro: Headaches[ ] ; Vertigo[ ] ; Seizures[ ] ; Paresthesias[ ] ;Blurred vision [ ] ; Diplopia [ ] ; Vision changes [ ]   Ortho/Skin: Arthritis [ ] ; Joint pain [ ] ; Muscle pain [ ] ; Joint swelling [ ] ; Back Pain [ ] ; Rash [ ]   Psych: Depression[ ] ; Anxiety[ ]   Heme: Bleeding problems [ ] ; Clotting disorders [ ] ; Anemia [ ]   Endocrine: Diabetes [ ] ; Thyroid dysfunction[ ]      Objective:    Vital Signs:   Temp:  [98.3 F (36.8 C)-98.9 F (37.2 C)] 98.9 F (37.2 C) (01/10 1622) Pulse Rate:  [69-118] 69 (01/11 0230) Resp:  [14-24] 24 (01/11 0230) BP: (115-132)/(82-100) 115/82 (01/10 2110) SpO2:  [95 %-100 %] 95 % (01/11 0230)    Weight change: There were no vitals filed for this visit.  Intake/Output:  No intake or output data in the 24 hours ending 12/25/20 0236    Physical Exam    General:  Well appearing. No resp difficulty HEENT: normal Neck: supple. JVP . Carotids 2+ bilat; no bruits. No lymphadenopathy or thyromegaly appreciated. Cor: PMI nondisplaced. Regular rate & rhythm. No rubs, gallops or murmurs.  Lungs: clear to auscultation bilaterally.  Chest tenderness on exam. Abdomen: soft, nontender, nondistended. No hepatosplenomegaly. No bruits or masses. Good bowel sounds. Extremities: no cyanosis, clubbing, rash, edema Neuro: alert & orientedx3, cranial nerves grossly intact. moves all 4 extremities w/o difficulty. Affect pleasant    EKG    ECG from 12/24/2020, that I  reviewed personally, shows sinus tachycardia, rate 113 bpm, and no ST-T changes  Labs   Basic Metabolic Panel: Recent Labs  Lab 12/24/20 1132  NA 133*  K 3.5  CL 96*  CO2 22  GLUCOSE 112*  BUN 11  CREATININE 1.32*  CALCIUM 9.4    Liver Function Tests: Recent Labs  Lab 12/24/20 2239  AST 17  ALT 16  ALKPHOS 64  BILITOT 1.1  PROT 6.7  ALBUMIN 3.7   Recent Labs  Lab 12/24/20 2239  LIPASE 26   No results for input(s): AMMONIA in the last 168 hours.  CBC: Recent Labs  Lab 12/24/20 1132  WBC 7.8  HGB 16.8  HCT 48.8  MCV 99.6  PLT 196  Cardiac Enzymes: No results for input(s): CKTOTAL, CKMB, CKMBINDEX, TROPONINI in the last 168 hours.  BNP: BNP (last 3 results) No results for input(s): BNP in the last 8760 hours.  ProBNP (last 3 results) No results for input(s): PROBNP in the last 8760 hours.   CBG: No results for input(s): GLUCAP in the last 168 hours.  Coagulation Studies: No results for input(s): LABPROT, INR in the last 72 hours.   Imaging   DG Chest Portable 1 View  Result Date: 12/24/2020 CLINICAL DATA:  Chest pain EXAM: PORTABLE CHEST 1 VIEW COMPARISON:  11/01/2020 FINDINGS: The heart size and mediastinal contours are within normal limits. Both lungs are clear. The visualized skeletal structures are unremarkable. IMPRESSION: Negative. Electronically Signed   By: Charlett Nose M.D.   On: 12/24/2020 23:18     Medications:     Current Medications:  aspirin  325 mg Oral Daily    Infusions:      Assessment/Plan   1. intermittent chest pain The patient presents to the hospital with complaints of abdominal pain and nausea.  He also has had intermittent chest pain for the past few days.  In the hospital his ECG does not show any ischemic changes.  The high-sensitivity troponins are negative x2.  He still continues to smoke daily.  Patient should be on daily aspirin (81 mg).  In addition he should take atorvastatin 20-40 mg daily.  He  will also be given a prescription for sublingual nitroglycerin should his chest pain recur.  Patient is to be set up for a follow-up appointment in the cardiology clinic for further investigation of his symptoms.     Lonie Peak, MD  12/25/2020, 2:36 AM  Cardiology Overnight Team Please contact [facility] for night-coverage after hours (4p -7a ) and weekends on [email or web address]

## 2021-01-14 ENCOUNTER — Ambulatory Visit: Payer: Self-pay

## 2021-01-14 ENCOUNTER — Other Ambulatory Visit: Payer: Self-pay

## 2021-01-14 DIAGNOSIS — B20 Human immunodeficiency virus [HIV] disease: Secondary | ICD-10-CM

## 2021-01-15 LAB — T-HELPER CELL (CD4) - (RCID CLINIC ONLY)
CD4 % Helper T Cell: 54 % (ref 33–65)
CD4 T Cell Abs: 1129 /uL (ref 400–1790)

## 2021-01-16 ENCOUNTER — Encounter: Payer: Self-pay | Admitting: Internal Medicine

## 2021-01-17 LAB — HIV-1 RNA QUANT-NO REFLEX-BLD
HIV 1 RNA Quant: <20 Copies/mL
HIV-1 RNA Quant, Log: <1.3 Log cps/mL

## 2021-01-28 ENCOUNTER — Encounter: Payer: Self-pay | Admitting: Internal Medicine

## 2021-02-12 ENCOUNTER — Ambulatory Visit (INDEPENDENT_AMBULATORY_CARE_PROVIDER_SITE_OTHER): Payer: Self-pay | Admitting: Internal Medicine

## 2021-02-12 ENCOUNTER — Other Ambulatory Visit: Payer: Self-pay

## 2021-02-12 ENCOUNTER — Encounter: Payer: Self-pay | Admitting: Internal Medicine

## 2021-02-12 VITALS — Ht 68.0 in | Wt 185.0 lb

## 2021-02-12 DIAGNOSIS — B20 Human immunodeficiency virus [HIV] disease: Secondary | ICD-10-CM

## 2021-02-12 DIAGNOSIS — Z113 Encounter for screening for infections with a predominantly sexual mode of transmission: Secondary | ICD-10-CM

## 2021-02-12 DIAGNOSIS — E785 Hyperlipidemia, unspecified: Secondary | ICD-10-CM

## 2021-02-12 DIAGNOSIS — Z716 Tobacco abuse counseling: Secondary | ICD-10-CM

## 2021-02-12 DIAGNOSIS — I25110 Atherosclerotic heart disease of native coronary artery with unstable angina pectoris: Secondary | ICD-10-CM

## 2021-02-12 NOTE — Assessment & Plan Note (Signed)
I again encouraged him to take the appropriate medications and get back into care with cardiology.

## 2021-02-12 NOTE — Progress Notes (Signed)
   Subjective:    Patient ID: Ransome Helwig, male    DOB: 07/20/1966, 55 y.o.   MRN: [id number]  HPI Here for follow up for HIV He continues on Biktarvy and no missed doses. CD4 remains robust at 1129 and viral load < 20.  Was seen in the ED for chest pain but no further chest pain since.  He did not follow up with cardiology and continues to be reluctant to take other medications or stop smoking.  No complaints to me today.     Review of Systems  Constitutional: Negative for fatigue.  Gastrointestinal: Negative for diarrhea and nausea.  Skin: Negative for rash.       Objective:   Physical Exam Eyes:     General: No scleral icterus. Cardiovascular:     Rate and Rhythm: Regular rhythm.  Pulmonary:     Effort: No respiratory distress.  Neurological:     General: No focal deficit present.     Mental Status: He is alert.  Psychiatric:        Mood and Affect: Mood normal.    SH: + tobacco       Assessment & Plan:

## 2021-02-12 NOTE — Assessment & Plan Note (Signed)
I again encouraged smoking cessation. He is precontemplative.

## 2021-02-12 NOTE — Assessment & Plan Note (Signed)
He continues to do well and no changes.  Continue with biktarvy and rtc in 6 months.

## 2021-02-13 ENCOUNTER — Emergency Department (HOSPITAL_COMMUNITY): Payer: Self-pay

## 2021-02-13 ENCOUNTER — Emergency Department (HOSPITAL_COMMUNITY)
Admission: EM | Admit: 2021-02-13 | Discharge: 2021-02-14 | Disposition: A | Discharge status: Home / Self Care | Payer: Self-pay | Attending: Emergency Medicine | Admitting: Emergency Medicine

## 2021-02-13 ENCOUNTER — Encounter (HOSPITAL_COMMUNITY): Payer: Self-pay | Admitting: Emergency Medicine

## 2021-02-13 DIAGNOSIS — R197 Diarrhea, unspecified: Secondary | ICD-10-CM | POA: Insufficient documentation

## 2021-02-13 DIAGNOSIS — Z20822 Contact with and (suspected) exposure to covid-19: Secondary | ICD-10-CM | POA: Insufficient documentation

## 2021-02-13 DIAGNOSIS — R112 Nausea with vomiting, unspecified: Secondary | ICD-10-CM | POA: Insufficient documentation

## 2021-02-13 DIAGNOSIS — Z7982 Long term (current) use of aspirin: Secondary | ICD-10-CM | POA: Insufficient documentation

## 2021-02-13 DIAGNOSIS — Z955 Presence of coronary angioplasty implant and graft: Secondary | ICD-10-CM | POA: Insufficient documentation

## 2021-02-13 DIAGNOSIS — I25110 Atherosclerotic heart disease of native coronary artery with unstable angina pectoris: Secondary | ICD-10-CM | POA: Insufficient documentation

## 2021-02-13 DIAGNOSIS — F17210 Nicotine dependence, cigarettes, uncomplicated: Secondary | ICD-10-CM | POA: Insufficient documentation

## 2021-02-13 DIAGNOSIS — B20 Human immunodeficiency virus [HIV] disease: Secondary | ICD-10-CM | POA: Insufficient documentation

## 2021-02-13 DIAGNOSIS — R0789 Other chest pain: Secondary | ICD-10-CM | POA: Insufficient documentation

## 2021-02-13 LAB — BASIC METABOLIC PANEL
Anion gap: 13 (ref 5–15)
BUN: 10 mg/dL (ref 6–20)
CO2: 21 mmol/L — ABNORMAL LOW (ref 22–32)
Calcium: 9.7 mg/dL (ref 8.9–10.3)
Chloride: 105 mmol/L (ref 98–111)
Creatinine, Ser: 0.98 mg/dL (ref 0.61–1.24)
GFR, Estimated: >60 mL/min (ref >60)
Glucose, Bld: 117 mg/dL — ABNORMAL HIGH (ref 70–99)
Potassium: 3.7 mmol/L (ref 3.5–5.1)
Sodium: 139 mmol/L (ref 135–145)

## 2021-02-13 LAB — TROPONIN I (HIGH SENSITIVITY)
Troponin I (High Sensitivity): 7 ng/L (ref <18)
Troponin I (High Sensitivity): 7 ng/L (ref <18)

## 2021-02-13 LAB — CBC
HCT: 45.8 % (ref 39.0–52.0)
Hemoglobin: 15.9 g/dL (ref 13.0–17.0)
MCH: 34.6 pg — ABNORMAL HIGH (ref 26.0–34.0)
MCHC: 34.7 g/dL (ref 30.0–36.0)
MCV: 99.8 fL (ref 80.0–100.0)
Platelets: 206 10*3/uL (ref 150–400)
RBC: 4.59 MIL/uL (ref 4.22–5.81)
RDW: 14.3 % (ref 11.5–15.5)
WBC: 7.4 10*3/uL (ref 4.0–10.5)
nRBC: 0 % (ref 0.0–0.2)

## 2021-02-13 LAB — SARS CORONAVIRUS 2 (TAT 6-24 HRS): SARS Coronavirus 2: NEGATIVE

## 2021-02-13 MED ORDER — ONDANSETRON HCL 4 MG/2ML IJ SOLN
4.0000 mg | Freq: Once | INTRAMUSCULAR | Status: AC
  Administered 2021-02-13: 4 mg via INTRAVENOUS
  Filled 2021-02-13: qty 2

## 2021-02-13 MED ORDER — ONDANSETRON 4 MG PO TBDP: 4.0000 mg | ORAL_TABLET | Freq: Three times a day (TID) | ORAL | 0 refills | Status: DC | PRN

## 2021-02-13 MED ORDER — MORPHINE SULFATE (PF) 4 MG/ML IV SOLN
4.0000 mg | Freq: Once | INTRAVENOUS | Status: AC
Start: 2021-02-13 — End: 2021-02-13
  Administered 2021-02-13: 4 mg via INTRAVENOUS
  Filled 2021-02-13: qty 1

## 2021-02-13 MED ORDER — SODIUM CHLORIDE 0.9 % IV BOLUS
500.0000 mL | Freq: Once | INTRAVENOUS | Status: AC
  Administered 2021-02-13: 500 mL via INTRAVENOUS

## 2021-02-13 NOTE — ED Triage Notes (Signed)
Patient complains of chest pain and shortness of breath that started at 0800 this morning. Patient had similar episode in the past and was diagnosed with acid reflux but has run out of that medication. Patient alert, oriented, and in no apparent distress at this time.

## 2021-02-13 NOTE — ED Notes (Signed)
Patient called for triage, patient in restroom.

## 2021-02-13 NOTE — Discharge Instructions (Signed)
Nausea, Vomiting, and Diarrhea ° °Hand washing: Wash your hands throughout the day, but especially before and after touching the face, using the restroom, sneezing, coughing, or touching surfaces that have been coughed or sneezed upon. °Hydration: Symptoms will be intensified and complicated by dehydration. Dehydration can also extend the duration of symptoms. Drink plenty of fluids and get plenty of rest. You should be drinking at least half a liter of water an hour to stay hydrated. Electrolyte drinks (ex. Gatorade, Powerade, Pedialyte) are also encouraged. You should be drinking enough fluids to make your urine light yellow, almost clear. If this is not the case, you are not drinking enough water. °Please note that some of the treatments indicated below will not be effective if you are not adequately hydrated. °Diet: Please concentrate on hydration, however, you may introduce food slowly.  Start with a clear liquid diet, progressed to a full liquid diet, and then bland solids as you are able. °Pain or fever: Ibuprofen, Naproxen, or Tylenol for pain or fever.  °Nausea/vomiting: Use the ondansetron (generic for Zofran) for nausea or vomiting.  °This medication may not prevent all vomiting or nausea, but can help facilitate better hydration. °Things that can help with nausea/vomiting also include peppermint/menthol candies, vitamin B12, and ginger. °Diarrhea: May use medications such as loperamide (Imodium) or Bismuth subsalicylate (Pepto-Bismol). °Follow-up: Follow-up with a primary care provider on this matter. °Return: Return should you develop a fever, bloody diarrhea, increased abdominal pain, uncontrolled vomiting, or any other major concerns. ° °For prescription assistance, may try using prescription discount sites or apps, such as goodrx.com °

## 2021-02-13 NOTE — ED Provider Notes (Signed)
[facility] EMERGENCY DEPARTMENT Provider Note   CSN: [id number] Arrival date & time: 02/13/21  1247     History Chief Complaint  Patient presents with  . Chest Pain    Gregory Grant is a 55 y.o. male.  HPI      Gregory Grant is a 55 y.o. male, with a history of GERD, HIV, presenting to the ED with chest pain beginning last night. Endorses pain in the center of the chest, constant since onset, sharp, nonradiating.  Worse with palpation of the region.  Also endorses nausea, nonbloody nonbilious vomiting, diarrhea beginning last night.  Denies fever, abdominal pain, back pain, dizziness, syncope, shortness of breath, hematochezia/melena, or any other complaints.  Last HIV quant was undetectable and last CD4 count was 1129, both sampled on January 14, 2021.  Past Medical History:  Diagnosis Date  . Coughing 02/2015  . GERD (gastroesophageal reflux disease)   . HIV (human immunodeficiency virus infection) [facility])     Patient Active Problem List   Diagnosis Date Noted  . Acute ST elevation myocardial infarction (STEMI) of inferolateral wall (HCC) 06/24/2019    Class: Hospitalized for  . Coronary artery disease involving native coronary artery of native heart with unstable angina pectoris (HCC) 06/24/2019  . Acute ST elevation myocardial infarction (STEMI) involving right coronary artery (HCC) 06/24/2019  . Medication monitoring encounter 06/21/2018  . Alcohol use 03/30/2018  . Right hip pain 03/30/2018  . Hyperlipidemia with target low density lipoprotein (LDL) cholesterol less than 70 mg/dL 70/62/3762  . Screening examination for venereal disease 12/22/2017  . Healthcare maintenance 12/22/2017  . Human immunodeficiency virus (HIV) disease (HCC) 04/13/2017  . Tobacco abuse counseling 04/13/2017  . Prediabetes 03/16/2015  . 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/14/2015    Past Surgical History:  Procedure Laterality Date  . CORONARY STENT INTERVENTION  N/A 06/24/2019   Procedure: CORONARY STENT INTERVENTION;  Surgeon: Marykay Lex, MD;  Location: [facility] INVASIVE CV LAB;  Service: Cardiovascular;  Laterality: N/A;  . CORONARY/GRAFT ACUTE MI REVASCULARIZATION N/A 06/24/2019   Procedure: Coronary/Graft Acute MI Revascularization;  Surgeon: Marykay Lex, MD;  Location: [facility] INVASIVE CV LAB;  Service: Cardiovascular;  Laterality: N/A;  . LEFT HEART CATH AND CORONARY ANGIOGRAPHY N/A 06/24/2019   Procedure: LEFT HEART CATH AND CORONARY ANGIOGRAPHY;  Surgeon: Marykay Lex, MD;  Location: [facility] INVASIVE CV LAB;  Service: Cardiovascular;  Laterality: N/A;       Family History  Problem Relation Age of Onset  . Dementia Mother   . Diabetes Father     Social History   Tobacco Use  . Smoking status: Current Every Day Smoker    Packs/day: 1.00    Years: 30.00    Pack years: 30.00    Types: Cigarettes  . Smokeless tobacco: Never Used  Substance Use Topics  . Alcohol use: Yes    Alcohol/week: 14.0 standard drinks    Types: 14 Cans of beer per week  . Drug use: No    Home Medications Prior to Admission medications   Medication Sig Start Date End Date Taking? Authorizing Provider  ondansetron (ZOFRAN ODT) 4 MG disintegrating tablet Take 1 tablet (4 mg total) by mouth every 8 (eight) hours as needed for nausea or vomiting. 02/13/21  Yes Elyssa Pendelton C, PA-C  aspirin EC 81 MG tablet Take 1 tablet (81 mg total) by mouth daily. Patient not taking: Reported on 02/12/2021 10/11/20   Fulp, Hewitt Shorts, MD  atorvastatin (LIPITOR) 80 MG tablet Take 1  tablet (80 mg total) by mouth daily. 12/25/20   Geoffery Lyons, MD  BIKTARVY 50-200-25 MG TABS tablet TAKE 1 TABLET BY MOUTH DAILY 08/21/20   Comer, Belia Heman, MD  nitroGLYCERIN (NITROSTAT) 0.4 MG SL tablet Place 1 tablet (0.4 mg total) under the tongue every 5 (five) minutes as needed for chest pain. 12/25/20   Geoffery Lyons, MD    Allergies    Patient has no known allergies.  Review of Systems   Review of Systems   Constitutional: Negative for fever.  Respiratory: Negative for shortness of breath.   Cardiovascular: Positive for chest pain. Negative for leg swelling.  Gastrointestinal: Positive for diarrhea, nausea and vomiting. Negative for abdominal pain and blood in stool.  Genitourinary: Negative for dysuria.  Musculoskeletal: Negative for neck pain and neck stiffness.  Neurological: Negative for dizziness, syncope, weakness and headaches.  All other systems reviewed and are negative.   Physical Exam Updated Vital Signs BP (!) 172/92 (BP Location: Right Arm)   Pulse (!) 51   Temp 97.9 F (36.6 C) (Oral)   Resp 16   SpO2 97%   Physical Exam Vitals and nursing note reviewed.  Constitutional:      General: He is not in acute distress.    Appearance: He is well-developed. He is not diaphoretic.  HENT:     Head: Normocephalic and atraumatic.     Mouth/Throat:     Mouth: Mucous membranes are moist.     Pharynx: Oropharynx is clear.  Eyes:     Conjunctiva/sclera: Conjunctivae normal.  Cardiovascular:     Rate and Rhythm: Normal rate and regular rhythm.     Pulses: Normal pulses.          Radial pulses are 2+ on the right side and 2+ on the left side.       Posterior tibial pulses are 2+ on the right side and 2+ on the left side.     Heart sounds: Normal heart sounds.     Comments: Tactile temperature in the extremities appropriate and equal bilaterally. Pulmonary:     Effort: Pulmonary effort is normal. No respiratory distress.     Breath sounds: Normal breath sounds.  Chest:     Chest wall: Tenderness present.    Abdominal:     Palpations: Abdomen is soft.     Tenderness: There is no abdominal tenderness. There is no guarding.  Musculoskeletal:     Cervical back: Normal range of motion and neck supple.     Right lower leg: No edema.     Left lower leg: No edema.  Skin:    General: Skin is warm and dry.  Neurological:     Mental Status: He is alert.  Psychiatric:         Mood and Affect: Mood and affect normal.        Speech: Speech normal.        Behavior: Behavior normal.     ED Results / Procedures / Treatments   Labs (all labs ordered are listed, but only abnormal results are displayed) Labs Reviewed  BASIC METABOLIC PANEL - Abnormal; Notable for the following components:      Result Value   CO2 21 (*)    Glucose, Bld 117 (*)    All other components within normal limits  CBC - Abnormal; Notable for the following components:   MCH 34.6 (*)    All other components within normal limits  SARS CORONAVIRUS 2 (TAT 6-24 HRS)  TROPONIN  I (HIGH SENSITIVITY)  TROPONIN I (HIGH SENSITIVITY)    EKG EKG Interpretation  Date/Time:  Wednesday February 13 2021 13:03:17 EST Ventricular Rate:  52 PR Interval:  152 QRS Duration: 74 QT Interval:  446 QTC Calculation: 414 R Axis:   -42 Text Interpretation: Sinus bradycardia Left axis deviation Abnormal ECG Twaves more peaked anteriorly than prevous. rate significantly slower than previous but QRS morhology  unchaged from previous Confirmed by Arby Barrette [phone]) on 02/13/2021 5:58:47 PM   Radiology DG Chest 2 View  Result Date: 02/13/2021 CLINICAL DATA:  Chest pain. EXAM: CHEST - 2 VIEW COMPARISON:  December 24, 2020. FINDINGS: The heart size and mediastinal contours are within normal limits. Both lungs are clear. No pneumothorax or pleural effusion is noted. The visualized skeletal structures are unremarkable. IMPRESSION: No active cardiopulmonary disease. Electronically Signed   By: Lupita Raider M.D.   On: 02/13/2021 13:43    Procedures Procedures   Medications Ordered in ED Medications  morphine 4 MG/ML injection 4 mg (4 mg Intravenous Given 02/13/21 1905)  ondansetron (ZOFRAN) injection 4 mg (4 mg Intravenous Given 02/13/21 1905)  sodium chloride 0.9 % bolus 500 mL (0 mLs Intravenous Stopped 02/13/21 2006)    ED Course  I have reviewed the triage vital signs and the nursing notes.  Pertinent labs &  imaging results that were available during my care of the patient were reviewed by me and considered in my medical decision making (see chart for details).    MDM Rules/Calculators/A&P                          Patient presents complaining of nausea, vomiting, diarrhea.  Also complains of chest discomfort, reproducible on palpation. Patient is nontoxic appearing, afebrile, not tachycardic, not tachypneic, not hypotensive, maintains excellent SPO2 on room air, and is in no apparent distress.   I have reviewed the patient's chart to obtain more information.   I reviewed and interpreted the patient's labs and radiological studies.  Noted some peaked to the patient's T waves, however, these have been noted on EKGs in the past for this patient.  No potassium abnormality noted. Low suspicion for ACS. HEART score is 3, indicating low risk for a cardiac event.  EKG without evidence of acute ischemia or pathologic/symptomatic arrhythmia.  Delta troponins negative. Wells criteria score is 0, indicating low risk for PE.   Dissection was considered, but thought less likely base on: History and description of the pain are not suggestive, patient is not ill-appearing, lack of risk factors, equal bilateral pulses, lack of neurologic deficits, no widened mediastinum on chest x-ray. Tolerating PO prior to discharge. The patient was given instructions for home care as well as return precautions. Patient voices understanding of these instructions, accepts the plan, and is comfortable with discharge.    Findings and plan of care discussed with attending physician, Arby Barrette, MD.   Vitals:   02/13/21 1302 02/13/21 1535 02/13/21 1855 02/13/21 2332  BP: (!) 180/105 (!) 172/92 (!) 173/81 130/89  Pulse: (!) 51 (!) 51 80 96  Resp: 19 16 16 20   Temp: 97.9 F (36.6 C)   98.6 F (37 C)  TempSrc: Oral     SpO2: 100% 97% 98% 95%     Final Clinical Impression(s) / ED Diagnoses Final diagnoses:  Atypical  chest pain  Nausea, vomiting, and diarrhea    Rx / DC Orders ED Discharge Orders  Ordered    ondansetron (ZOFRAN ODT) 4 MG disintegrating tablet  Every 8 hours PRN        02/13/21 2319           Anselm PancoastJoy, Psalm Schappell C, PA-C 02/13/21 2348    Arby BarrettePfeiffer, Marcy, MD 02/14/21 1541

## 2021-02-20 ENCOUNTER — Other Ambulatory Visit: Payer: Self-pay | Admitting: Internal Medicine

## 2021-02-20 DIAGNOSIS — B20 Human immunodeficiency virus [HIV] disease: Secondary | ICD-10-CM

## 2021-03-30 ENCOUNTER — Emergency Department (HOSPITAL_COMMUNITY)
Admission: EM | Admit: 2021-03-30 | Discharge: 2021-03-31 | Disposition: A | Discharge status: Home / Self Care | Payer: Self-pay | Attending: Emergency Medicine | Admitting: Emergency Medicine

## 2021-03-30 ENCOUNTER — Other Ambulatory Visit: Payer: Self-pay

## 2021-03-30 ENCOUNTER — Encounter (HOSPITAL_COMMUNITY): Payer: Self-pay | Admitting: Emergency Medicine

## 2021-03-30 ENCOUNTER — Emergency Department (HOSPITAL_COMMUNITY): Payer: Self-pay

## 2021-03-30 DIAGNOSIS — F17210 Nicotine dependence, cigarettes, uncomplicated: Secondary | ICD-10-CM | POA: Insufficient documentation

## 2021-03-30 DIAGNOSIS — R0602 Shortness of breath: Secondary | ICD-10-CM | POA: Insufficient documentation

## 2021-03-30 DIAGNOSIS — B20 Human immunodeficiency virus [HIV] disease: Secondary | ICD-10-CM | POA: Insufficient documentation

## 2021-03-30 DIAGNOSIS — R0789 Other chest pain: Secondary | ICD-10-CM

## 2021-03-30 DIAGNOSIS — K219 Gastro-esophageal reflux disease without esophagitis: Secondary | ICD-10-CM | POA: Insufficient documentation

## 2021-03-30 DIAGNOSIS — R1110 Vomiting, unspecified: Secondary | ICD-10-CM | POA: Insufficient documentation

## 2021-03-30 DIAGNOSIS — Z7982 Long term (current) use of aspirin: Secondary | ICD-10-CM | POA: Insufficient documentation

## 2021-03-30 LAB — CBC
HCT: 46.8 % (ref 39.0–52.0)
Hemoglobin: 15.9 g/dL (ref 13.0–17.0)
MCH: 33.7 pg (ref 26.0–34.0)
MCHC: 34 g/dL (ref 30.0–36.0)
MCV: 99.2 fL (ref 80.0–100.0)
Platelets: 191 10*3/uL (ref 150–400)
RBC: 4.72 MIL/uL (ref 4.22–5.81)
RDW: 14 % (ref 11.5–15.5)
WBC: 10.7 10*3/uL — ABNORMAL HIGH (ref 4.0–10.5)
nRBC: 0 % (ref 0.0–0.2)

## 2021-03-30 LAB — COMPREHENSIVE METABOLIC PANEL
ALT: 20 U/L (ref 0–44)
AST: 33 U/L (ref 15–41)
Albumin: 3.8 g/dL (ref 3.5–5.0)
Alkaline Phosphatase: 73 U/L (ref 38–126)
Anion gap: 12 (ref 5–15)
BUN: 8 mg/dL (ref 6–20)
CO2: 27 mmol/L (ref 22–32)
Calcium: 9 mg/dL (ref 8.9–10.3)
Chloride: 94 mmol/L — ABNORMAL LOW (ref 98–111)
Creatinine, Ser: 1.08 mg/dL (ref 0.61–1.24)
GFR, Estimated: >60 mL/min (ref >60)
Glucose, Bld: 126 mg/dL — ABNORMAL HIGH (ref 70–99)
Potassium: 3.5 mmol/L (ref 3.5–5.1)
Sodium: 133 mmol/L — ABNORMAL LOW (ref 135–145)
Total Bilirubin: 1.2 mg/dL (ref 0.3–1.2)
Total Protein: 6.4 g/dL — ABNORMAL LOW (ref 6.5–8.1)

## 2021-03-30 LAB — LIPASE, BLOOD: Lipase: 29 U/L (ref 11–51)

## 2021-03-30 LAB — TROPONIN I (HIGH SENSITIVITY): Troponin I (High Sensitivity): 9 ng/L (ref <18)

## 2021-03-30 NOTE — ED Triage Notes (Signed)
Emergency Medicine Provider Triage Evaluation Note  Wyman Meschke , a 55 y.o. male  was evaluated in triage.  Pt complains of chest pain since this morning.  Reports central chest pain.  Significantly improved after vomiting x1.  Was having some shortness of breath earlier.  No cough.  Review of Systems  Positive: Chest pain, vomiting Negative: Cough  Physical Exam  BP 133/83   Pulse 68   Temp 98.5 F (36.9 C) (Oral)   Resp 18   SpO2 97%  Gen:   Awake, no distress   HEENT:  Atraumatic  Resp:  Normal effort  Cardiac:  Normal rate  Abd:   Nondistended, nontender  MSK:   Moves extremities without difficulty  Neuro:  Speech clear   Medical Decision Making  Medically screening exam initiated at 6:22 PM.  Appropriate orders placed.  Montrelle Eddings was informed that the remainder of the evaluation will be completed by another provider, this initial triage assessment does not replace that evaluation, and the importance of remaining in the ED until their evaluation is complete.  Clinical Impression  Will obtain chest pain labs and add abdominal labs.   Dietrich Pates, PA-C 03/30/21 1824

## 2021-03-30 NOTE — ED Triage Notes (Signed)
Pt to triage via [facility] from his brother's home.  Reports chest pain since this morning around 8am.  Reports cough with brown phlegm and vomiting.  20 g L FA.  NTG and ASA given by EMS with some improvement.

## 2021-03-30 NOTE — ED Notes (Signed)
Patient agitated over wait time, cursing at staff. Security present, encouraged patient to keep the profanity down.

## 2021-03-31 LAB — TROPONIN I (HIGH SENSITIVITY): Troponin I (High Sensitivity): 12 ng/L (ref <18)

## 2021-03-31 MED ORDER — PANTOPRAZOLE SODIUM 20 MG PO TBEC: 20.0000 mg | DELAYED_RELEASE_TABLET | Freq: Every day | ORAL | 0 refills | Status: DC

## 2021-03-31 NOTE — ED Notes (Signed)
E-signature pad unavailable at time of pt discharge. This RN discussed discharge materials with pt and answered all pt questions. Pt stated understanding of discharge material. ? ?

## 2021-03-31 NOTE — ED Provider Notes (Signed)
[facility] EMERGENCY DEPARTMENT Provider Note  CSN: [id number] Arrival date & time: 03/30/21 1809    History Chief Complaint  Patient presents with  . Chest Pain    HPI  Gregory Grant is a 55 y.o. male with history of GERD, HIV and CAD s/p cath in 2020 presents for evaluation of chest pain started Saturday morning when he was having an episode of vomiting. He had another episode of vomiting and chest pain a short while later. Mild SOB, but no cough or fever. He presented to the ED later that afternoon and had a screening exam done in triage. While awaiting an ED exam room his symptoms have resolved. He no longer feels nauseated and chest pain has gone. He has been able to drink some water in the waiting room without difficult. He was seen for similar symptoms in March and in January. ED workups neg. States he is still waiting on someone from Cardiology clinic to call him for an appointment but he has not attempted to contact them himself. States he has never taken anything for his GERD or seen GI.    Past Medical History:  Diagnosis Date  . Coughing 02/2015  . GERD (gastroesophageal reflux disease)   . HIV (human immunodeficiency virus infection) (HCC)     Past Surgical History:  Procedure Laterality Date  . CORONARY STENT INTERVENTION N/A 06/24/2019   Procedure: CORONARY STENT INTERVENTION;  Surgeon: Marykay Lex, MD;  Location: [facility] INVASIVE CV LAB;  Service: Cardiovascular;  Laterality: N/A;  . CORONARY/GRAFT ACUTE MI REVASCULARIZATION N/A 06/24/2019   Procedure: Coronary/Graft Acute MI Revascularization;  Surgeon: Marykay Lex, MD;  Location: [facility] INVASIVE CV LAB;  Service: Cardiovascular;  Laterality: N/A;  . LEFT HEART CATH AND CORONARY ANGIOGRAPHY N/A 06/24/2019   Procedure: LEFT HEART CATH AND CORONARY ANGIOGRAPHY;  Surgeon: Marykay Lex, MD;  Location: [facility] INVASIVE CV LAB;  Service: Cardiovascular;  Laterality: N/A;    Family History  Problem Relation Age of  Onset  . Dementia Mother   . Diabetes Father     Social History   Tobacco Use  . Smoking status: Current Every Day Smoker    Packs/day: 1.00    Years: 30.00    Pack years: 30.00    Types: Cigarettes  . Smokeless tobacco: Never Used  Substance Use Topics  . Alcohol use: Yes    Alcohol/week: 14.0 standard drinks    Types: 14 Cans of beer per week  . Drug use: No     Home Medications Prior to Admission medications   Medication Sig Start Date End Date Taking? Authorizing Provider  aspirin EC 81 MG tablet Take 1 tablet (81 mg total) by mouth daily. 10/11/20  Yes Fulp, Cammie, MD  atorvastatin (LIPITOR) 80 MG tablet Take 1 tablet (80 mg total) by mouth daily. 12/25/20  Yes Delo, Riley Lam, MD  BIKTARVY 50-200-25 MG TABS tablet TAKE 1 TABLET BY MOUTH DAILY Patient taking differently: Take 1 tablet by mouth daily. 02/20/21  Yes Comer, Belia Heman, MD  nitroGLYCERIN (NITROSTAT) 0.4 MG SL tablet Place 1 tablet (0.4 mg total) under the tongue every 5 (five) minutes as needed for chest pain. 12/25/20  Yes Delo, Riley Lam, MD  ondansetron (ZOFRAN ODT) 4 MG disintegrating tablet Take 1 tablet (4 mg total) by mouth every 8 (eight) hours as needed for nausea or vomiting. 02/13/21  Yes Joy, Shawn C, PA-C  pantoprazole (PROTONIX) 20 MG tablet Take 1 tablet (20 mg total) by mouth daily. 03/31/21  Yes Pollyann Savoy, MD  famotidine (PEPCID) 20 MG tablet Take 1 tablet (20 mg total) by mouth 2 (two) times daily. To protect stomach 10/11/20 12/25/20  Fulp, Cammie, MD  losartan (COZAAR) 50 MG tablet Take 1 tablet (50 mg total) by mouth daily. To lower blood pressure 10/11/20 12/25/20  Cain Saupe, MD     Allergies    Patient has no known allergies.   Review of Systems   Review of Systems A comprehensive review of systems was completed and negative except as noted in HPI.    Physical Exam BP (!) 137/92   Pulse 70   Temp 98.6 F (37 C) (Oral)   Resp 17   SpO2 98%   Physical Exam Vitals and nursing  note reviewed.  Constitutional:      Appearance: Normal appearance.  HENT:     Head: Normocephalic and atraumatic.     Nose: Nose normal.     Mouth/Throat:     Mouth: Mucous membranes are moist.  Eyes:     Extraocular Movements: Extraocular movements intact.     Conjunctiva/sclera: Conjunctivae normal.  Cardiovascular:     Rate and Rhythm: Normal rate.  Pulmonary:     Effort: Pulmonary effort is normal.     Breath sounds: Normal breath sounds.  Chest:     Chest wall: No tenderness.  Abdominal:     General: Abdomen is flat.     Palpations: Abdomen is soft.     Tenderness: There is no abdominal tenderness.  Musculoskeletal:        General: No swelling. Normal range of motion.     Cervical back: Neck supple.  Skin:    General: Skin is warm and dry.  Neurological:     General: No focal deficit present.     Mental Status: He is alert.  Psychiatric:        Mood and Affect: Mood normal.      ED Results / Procedures / Treatments   Labs (all labs ordered are listed, but only abnormal results are displayed) Labs Reviewed  CBC - Abnormal; Notable for the following components:      Result Value   WBC 10.7 (*)    All other components within normal limits  COMPREHENSIVE METABOLIC PANEL - Abnormal; Notable for the following components:   Sodium 133 (*)    Chloride 94 (*)    Glucose, Bld 126 (*)    Total Protein 6.4 (*)    All other components within normal limits  LIPASE, BLOOD  TROPONIN I (HIGH SENSITIVITY)  TROPONIN I (HIGH SENSITIVITY)    EKG EKG Interpretation  Date/Time:  Saturday March 30 2021 18:20:29 EDT Ventricular Rate:  66 PR Interval:  138 QRS Duration: 72 QT Interval:  448 QTC Calculation: 469 R Axis:   -48 Text Interpretation: Normal sinus rhythm with sinus arrhythmia Left axis deviation T wave abnormality, consider inferior ischemia Abnormal ECG When compared with ECG of 02/13/2021, No significant change was found Confirmed by Dione Booze ([id number]) on  03/31/2021 12:16:35 AM   Radiology DG Chest 2 View  Result Date: 03/30/2021 CLINICAL DATA:  Chest pain EXAM: CHEST - 2 VIEW COMPARISON:  02/13/2021 FINDINGS: Heart and mediastinal contours are within normal limits. No focal opacities or effusions. No acute bony abnormality. Aortic atherosclerosis. IMPRESSION: No active cardiopulmonary disease. Electronically Signed   By: Charlett Nose M.D.   On: 03/30/2021 18:59    Procedures Procedures  Medications Ordered in the ED Medications - No data  to display   MDM Rules/Calculators/A&P MDM Patient with history of CAD here with atypical chest pain precipitated by vomiting. He is symptom free now and wants to go home. His labs done in triage are unremarkable including Trop x 2 with 10 hour delta. He was encouraged to follow up with his PCP as well as Cardiology and GI. Will start Protonix and advised to RTED for any other concerns.  ED Course  I have reviewed the triage vital signs and the nursing notes.  Pertinent labs & imaging results that were available during my care of the patient were reviewed by me and considered in my medical decision making (see chart for details).     Final Clinical Impression(s) / ED Diagnoses Final diagnoses:  Atypical chest pain  Gastroesophageal reflux disease without esophagitis    Rx / DC Orders ED Discharge Orders         Ordered    pantoprazole (PROTONIX) 20 MG tablet  Daily        03/31/21 0551           Pollyann Savoy, MD 03/31/21 [phone]

## 2021-06-12 ENCOUNTER — Other Ambulatory Visit: Payer: Self-pay | Admitting: Family

## 2021-06-12 DIAGNOSIS — B20 Human immunodeficiency virus [HIV] disease: Secondary | ICD-10-CM

## 2021-06-12 NOTE — Progress Notes (Unsigned)
mb

## 2021-06-20 ENCOUNTER — Other Ambulatory Visit: Payer: Self-pay

## 2021-06-20 ENCOUNTER — Other Ambulatory Visit: Payer: Self-pay | Admitting: Internal Medicine

## 2021-06-20 DIAGNOSIS — B20 Human immunodeficiency virus [HIV] disease: Secondary | ICD-10-CM

## 2021-06-20 MED ORDER — BIKTARVY 50-200-25 MG PO TABS: 1.0000 | ORAL_TABLET | Freq: Every day | ORAL | 5 refills | Status: DC

## 2021-08-14 ENCOUNTER — Other Ambulatory Visit: Payer: Self-pay

## 2021-08-14 ENCOUNTER — Encounter: Payer: Self-pay | Admitting: Internal Medicine

## 2021-08-14 DIAGNOSIS — Z113 Encounter for screening for infections with a predominantly sexual mode of transmission: Secondary | ICD-10-CM

## 2021-08-14 DIAGNOSIS — B20 Human immunodeficiency virus [HIV] disease: Secondary | ICD-10-CM

## 2021-08-14 DIAGNOSIS — E785 Hyperlipidemia, unspecified: Secondary | ICD-10-CM

## 2021-08-15 ENCOUNTER — Other Ambulatory Visit: Payer: Self-pay

## 2021-08-15 LAB — T-HELPER CELL (CD4) - (RCID CLINIC ONLY)
CD4 % Helper T Cell: 53 % (ref 33–65)
CD4 T Cell Abs: 1203 /uL (ref 400–1790)

## 2021-08-15 LAB — URINE CYTOLOGY ANCILLARY ONLY
Chlamydia: NEGATIVE
Comment: NEGATIVE
Comment: NORMAL
Neisseria Gonorrhea: NEGATIVE

## 2021-08-16 LAB — LIPID PANEL
Cholesterol: 142 mg/dL (ref <200)
HDL: 34 mg/dL — ABNORMAL LOW (ref >=40)
LDL Cholesterol (Calc): 80 mg/dL (calc)
Non-HDL Cholesterol (Calc): 108 mg/dL (calc) (ref <130)
Total CHOL/HDL Ratio: 4.2 (calc) (ref <5.0)
Triglycerides: 189 mg/dL — ABNORMAL HIGH (ref <150)

## 2021-08-16 LAB — CBC WITH DIFFERENTIAL/PLATELET
Absolute Monocytes: 678 cells/uL (ref 200–950)
Basophils Absolute: 79 cells/uL (ref 0–200)
Basophils Relative: 0.9 %
Eosinophils Absolute: 273 cells/uL (ref 15–500)
Eosinophils Relative: 3.1 %
HCT: 44.3 % (ref 38.5–50.0)
Hemoglobin: 15 g/dL (ref 13.2–17.1)
Lymphs Abs: 2596 cells/uL (ref 850–3900)
MCH: 34.6 pg — ABNORMAL HIGH (ref 27.0–33.0)
MCHC: 33.9 g/dL (ref 32.0–36.0)
MCV: 102.1 fL — ABNORMAL HIGH (ref 80.0–100.0)
MPV: 11.7 fL (ref 7.5–12.5)
Monocytes Relative: 7.7 %
Neutro Abs: 5174 cells/uL (ref 1500–7800)
Neutrophils Relative %: 58.8 %
Platelets: 185 10*3/uL (ref 140–400)
RBC: 4.34 10*6/uL (ref 4.20–5.80)
RDW: 13.6 % (ref 11.0–15.0)
Total Lymphocyte: 29.5 %
WBC: 8.8 10*3/uL (ref 3.8–10.8)

## 2021-08-16 LAB — RPR: RPR Ser Ql: NONREACTIVE

## 2021-08-16 LAB — COMPLETE METABOLIC PANEL WITH GFR
AG Ratio: 1.7 (calc) (ref 1.0–2.5)
ALT: 9 U/L (ref 9–46)
AST: 13 U/L (ref 10–35)
Albumin: 3.9 g/dL (ref 3.6–5.1)
Alkaline phosphatase (APISO): 65 U/L (ref 35–144)
BUN: 14 mg/dL (ref 7–25)
CO2: 23 mmol/L (ref 20–32)
Calcium: 9.1 mg/dL (ref 8.6–10.3)
Chloride: 107 mmol/L (ref 98–110)
Creat: 1.2 mg/dL (ref 0.70–1.30)
Globulin: 2.3 g/dL (calc) (ref 1.9–3.7)
Glucose, Bld: 94 mg/dL (ref 65–99)
Potassium: 3.7 mmol/L (ref 3.5–5.3)
Sodium: 139 mmol/L (ref 135–146)
Total Bilirubin: 0.4 mg/dL (ref 0.2–1.2)
Total Protein: 6.2 g/dL (ref 6.1–8.1)
eGFR: 72 mL/min/{1.73_m2} (ref >=60)

## 2021-08-16 LAB — HIV-1 RNA QUANT-NO REFLEX-BLD
HIV 1 RNA Quant: <20 Copies/mL — ABNORMAL HIGH
HIV-1 RNA Quant, Log: <1.3 Log cps/mL — ABNORMAL HIGH

## 2021-08-18 ENCOUNTER — Other Ambulatory Visit: Payer: Self-pay | Admitting: Internal Medicine

## 2021-08-18 DIAGNOSIS — B20 Human immunodeficiency virus [HIV] disease: Secondary | ICD-10-CM

## 2021-08-28 ENCOUNTER — Ambulatory Visit (INDEPENDENT_AMBULATORY_CARE_PROVIDER_SITE_OTHER): Payer: Self-pay | Admitting: Internal Medicine

## 2021-08-28 ENCOUNTER — Encounter: Payer: Self-pay | Admitting: Internal Medicine

## 2021-08-28 ENCOUNTER — Other Ambulatory Visit: Payer: Self-pay

## 2021-08-28 VITALS — BP 153/95 | HR 79 | Resp 16 | Ht 68.0 in | Wt 163.0 lb

## 2021-08-28 DIAGNOSIS — B20 Human immunodeficiency virus [HIV] disease: Secondary | ICD-10-CM

## 2021-08-28 DIAGNOSIS — Z5181 Encounter for therapeutic drug level monitoring: Secondary | ICD-10-CM

## 2021-08-28 DIAGNOSIS — Z716 Tobacco abuse counseling: Secondary | ICD-10-CM

## 2021-08-28 DIAGNOSIS — Z113 Encounter for screening for infections with a predominantly sexual mode of transmission: Secondary | ICD-10-CM

## 2021-08-28 NOTE — Assessment & Plan Note (Signed)
He continues to do well and no indication for any changes.  Continues Biktarvy and rtc in 6 months.

## 2021-08-28 NOTE — Assessment & Plan Note (Signed)
Screened negative 

## 2021-08-28 NOTE — Assessment & Plan Note (Signed)
Counseled to quit and precontemplative

## 2021-08-28 NOTE — Progress Notes (Signed)
   Subjective:    Patient ID: Gregory Grant, male    DOB: 1965-12-26, 55 y.o.   MRN: [id number]  HPI Here for routine follow up of HIV Continues on Biktarvy with no missed doses.  CD4 1203, viral load < 20.  No problems getting, taking or tolerating the medication.  No complaints today.     Review of Systems  Constitutional:  Negative for fatigue.  Gastrointestinal:  Negative for diarrhea and nausea.  Skin:  Negative for rash.      Objective:   Physical Exam Eyes:     General: No scleral icterus. Pulmonary:     Effort: Pulmonary effort is normal.  Skin:    Findings: No rash.  Neurological:     Mental Status: He is alert.  Psychiatric:        Mood and Affect: Mood normal.   SH: + tobacco       Assessment & Plan:

## 2021-08-28 NOTE — Assessment & Plan Note (Signed)
Creat, LFTs wnl.  

## 2021-08-29 ENCOUNTER — Encounter: Payer: Self-pay | Admitting: Internal Medicine

## 2021-10-18 ENCOUNTER — Telehealth: Payer: Self-pay

## 2021-10-18 NOTE — Telephone Encounter (Signed)
Patient currently working with [facility], [facility] and RW renewed by [facility]. Patient due for flu vaccination. Patient accepts appointment for nurse visit. Viral load currently suppressed. Patient doing well on current ART.  Valarie Cones

## 2021-10-21 ENCOUNTER — Ambulatory Visit (INDEPENDENT_AMBULATORY_CARE_PROVIDER_SITE_OTHER): Payer: Self-pay

## 2021-10-21 ENCOUNTER — Other Ambulatory Visit: Payer: Self-pay

## 2021-10-21 DIAGNOSIS — Z23 Encounter for immunization: Secondary | ICD-10-CM

## 2022-02-09 ENCOUNTER — Other Ambulatory Visit: Payer: Self-pay | Admitting: Family

## 2022-02-09 DIAGNOSIS — B20 Human immunodeficiency virus [HIV] disease: Secondary | ICD-10-CM

## 2022-02-12 ENCOUNTER — Other Ambulatory Visit: Payer: Self-pay | Admitting: Internal Medicine

## 2022-02-12 ENCOUNTER — Other Ambulatory Visit: Payer: Self-pay

## 2022-02-12 DIAGNOSIS — B20 Human immunodeficiency virus [HIV] disease: Secondary | ICD-10-CM

## 2022-02-15 LAB — HIV-1 RNA QUANT-NO REFLEX-BLD
HIV 1 RNA Quant: NOT DETECTED Copies/mL
HIV-1 RNA Quant, Log: NOT DETECTED Log cps/mL

## 2022-02-15 LAB — T-HELPER CELLS (CD4) COUNT (NOT AT ARMC)
Absolute CD4: 1110 cells/uL (ref 490–1740)
CD4 T Helper %: 54 % (ref 30–61)
Total lymphocyte count: 2058 cells/uL (ref 850–3900)

## 2022-02-26 ENCOUNTER — Encounter: Payer: Self-pay | Admitting: Internal Medicine

## 2022-03-04 ENCOUNTER — Encounter: Payer: Self-pay | Admitting: Internal Medicine

## 2022-03-13 ENCOUNTER — Encounter: Payer: Self-pay | Admitting: Internal Medicine

## 2022-03-13 ENCOUNTER — Other Ambulatory Visit: Payer: Self-pay

## 2022-03-13 ENCOUNTER — Other Ambulatory Visit: Payer: Self-pay | Admitting: Family

## 2022-03-13 ENCOUNTER — Ambulatory Visit: Payer: Self-pay

## 2022-03-13 ENCOUNTER — Ambulatory Visit (INDEPENDENT_AMBULATORY_CARE_PROVIDER_SITE_OTHER): Payer: Self-pay | Admitting: Internal Medicine

## 2022-03-13 VITALS — BP 141/89 | HR 91 | Temp 98.2°F | Resp 16 | Wt 165.0 lb

## 2022-03-13 DIAGNOSIS — Z716 Tobacco abuse counseling: Secondary | ICD-10-CM

## 2022-03-13 DIAGNOSIS — E785 Hyperlipidemia, unspecified: Secondary | ICD-10-CM

## 2022-03-13 DIAGNOSIS — Z23 Encounter for immunization: Secondary | ICD-10-CM

## 2022-03-13 DIAGNOSIS — Z113 Encounter for screening for infections with a predominantly sexual mode of transmission: Secondary | ICD-10-CM

## 2022-03-13 DIAGNOSIS — B20 Human immunodeficiency virus [HIV] disease: Secondary | ICD-10-CM

## 2022-03-13 MED ORDER — BIKTARVY 50-200-25 MG PO TABS: 1.0000 | ORAL_TABLET | Freq: Every day | ORAL | 11 refills | Status: DC

## 2022-03-13 NOTE — Assessment & Plan Note (Signed)
I discussed Prevnar-20 and given today ?

## 2022-03-13 NOTE — Addendum Note (Signed)
Addended by: Marcell Anger on: 03/13/2022 02:18 PM ? ? Modules accepted: Orders ? ?

## 2022-03-13 NOTE — Addendum Note (Signed)
Addended by: Gardiner Barefoot on: 03/13/2022 10:26 AM ? ? Modules accepted: Orders ? ?

## 2022-03-13 NOTE — Progress Notes (Signed)
? ?  Subjective:  ? ? Patient ID: Gregory Grant, male    DOB: 01/14/1966, 55 y.o.   MRN: [id number] ? ?HPI ?Here for follow up of HIV ?He continues on [place] and has had no missed doses.  No issues with getting, taking or tolerating his medication.  No new issues.   ? ? ?Review of Systems  ?Constitutional:  Negative for fatigue.  ?Gastrointestinal:  Negative for diarrhea and nausea.  ?Skin:  Negative for rash.  ? ?   ?Objective:  ? Physical Exam ?Eyes:  ?   General: No scleral icterus. ?Pulmonary:  ?   Effort: Pulmonary effort is normal.  ?Neurological:  ?   General: No focal deficit present.  ?   Mental Status: He is alert.  ?Psychiatric:     ?   Mood and Affect: Mood normal.  ? ?SH: + tobacco ? ? ? ?   ?Assessment & Plan:  ? ? ?

## 2022-03-13 NOTE — Assessment & Plan Note (Signed)
He continues to do well on Biktarvy and no concerns.  Labs reassuring.   ?Follow up in 6 months.  ?

## 2022-03-13 NOTE — Assessment & Plan Note (Signed)
Counseled today on quitting ?

## 2022-03-17 ENCOUNTER — Other Ambulatory Visit: Payer: Self-pay | Admitting: Pharmacist

## 2022-03-17 DIAGNOSIS — B20 Human immunodeficiency virus [HIV] disease: Secondary | ICD-10-CM

## 2022-03-17 MED ORDER — BIKTARVY 50-200-25 MG PO TABS: 1.0000 | ORAL_TABLET | Freq: Every day | ORAL | 0 refills | Status: AC

## 2022-03-17 NOTE — Progress Notes (Signed)
Medication Samples have been provided to the patient. ? ?Drug name: Biktarvy        ?Strength: 50/200/25 mg       ?Qty: 2 bottles (14 tablets)   ?LOT: [id number]   ?Exp.Date: 09/15/23 ? ?Dosing instructions: Take one tablet by mouth once daily ? ?The patient has been instructed regarding the correct time, dose, and frequency of taking this medication, including desired effects and most common side effects.  ? ?Lakyn Mantione L. Lillyann Ahart, PharmD, BCIDP, AAHIVP, CPP ?Clinical Pharmacist Practitioner ?Infectious Diseases Clinical Pharmacist ?[facility] ?11/26/2020, 10:07 AM ? ?

## 2022-09-11 ENCOUNTER — Ambulatory Visit (INDEPENDENT_AMBULATORY_CARE_PROVIDER_SITE_OTHER): Payer: Self-pay | Admitting: Internal Medicine

## 2022-09-11 ENCOUNTER — Other Ambulatory Visit: Payer: Self-pay

## 2022-09-11 ENCOUNTER — Encounter: Payer: Self-pay | Admitting: Internal Medicine

## 2022-09-11 VITALS — BP 120/89 | HR 97 | Resp 16 | Ht 68.0 in | Wt 164.5 lb

## 2022-09-11 DIAGNOSIS — M25551 Pain in right hip: Secondary | ICD-10-CM

## 2022-09-11 DIAGNOSIS — Z716 Tobacco abuse counseling: Secondary | ICD-10-CM

## 2022-09-11 DIAGNOSIS — Z113 Encounter for screening for infections with a predominantly sexual mode of transmission: Secondary | ICD-10-CM

## 2022-09-11 DIAGNOSIS — B20 Human immunodeficiency virus [HIV] disease: Secondary | ICD-10-CM

## 2022-09-11 DIAGNOSIS — Z23 Encounter for immunization: Secondary | ICD-10-CM

## 2022-09-11 DIAGNOSIS — E785 Hyperlipidemia, unspecified: Secondary | ICD-10-CM

## 2022-09-11 NOTE — Assessment & Plan Note (Signed)
Will refer him to orthopedist for his ongoing hip pain

## 2022-09-11 NOTE — Assessment & Plan Note (Signed)
Will screen 

## 2022-09-11 NOTE — Assessment & Plan Note (Signed)
He continues to do well with Biktarvy and has remained suppressed.  No changes indicated and will check his labs today.  Follow up in 6 months.
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
## 2022-09-11 NOTE — Assessment & Plan Note (Signed)
counseled

## 2022-09-12 LAB — URINE CYTOLOGY ANCILLARY ONLY
Chlamydia: NEGATIVE
Comment: NEGATIVE
Comment: NORMAL
Neisseria Gonorrhea: NEGATIVE

## 2022-09-12 LAB — T-HELPER CELL (CD4) - (RCID CLINIC ONLY)
CD4 % Helper T Cell: 53 % (ref 33–65)
CD4 T Cell Abs: 1147 /uL (ref 400–1790)

## 2022-09-16 LAB — COMPLETE METABOLIC PANEL WITH GFR
AG Ratio: 1.5 (calc) (ref 1.0–2.5)
ALT: 11 U/L (ref 9–46)
AST: 12 U/L (ref 10–35)
Albumin: 4.4 g/dL (ref 3.6–5.1)
Alkaline phosphatase (APISO): 83 U/L (ref 35–144)
BUN: 12 mg/dL (ref 7–25)
CO2: 25 mmol/L (ref 20–32)
Calcium: 9.8 mg/dL (ref 8.6–10.3)
Chloride: 106 mmol/L (ref 98–110)
Creat: 1.26 mg/dL (ref 0.70–1.30)
Globulin: 2.9 g/dL (calc) (ref 1.9–3.7)
Glucose, Bld: 93 mg/dL (ref 65–99)
Potassium: 4.5 mmol/L (ref 3.5–5.3)
Sodium: 138 mmol/L (ref 135–146)
Total Bilirubin: 0.5 mg/dL (ref 0.2–1.2)
Total Protein: 7.3 g/dL (ref 6.1–8.1)
eGFR: 68 mL/min/{1.73_m2} (ref >=60)

## 2022-09-16 LAB — CBC WITH DIFFERENTIAL/PLATELET
Absolute Monocytes: 679 cells/uL (ref 200–950)
Basophils Absolute: 51 cells/uL (ref 0–200)
Basophils Relative: 0.7 %
Eosinophils Absolute: 102 cells/uL (ref 15–500)
Eosinophils Relative: 1.4 %
HCT: 48.6 % (ref 38.5–50.0)
Hemoglobin: 17.1 g/dL (ref 13.2–17.1)
Lymphs Abs: 2438 cells/uL (ref 850–3900)
MCH: 35 pg — ABNORMAL HIGH (ref 27.0–33.0)
MCHC: 35.2 g/dL (ref 32.0–36.0)
MCV: 99.4 fL (ref 80.0–100.0)
MPV: 11 fL (ref 7.5–12.5)
Monocytes Relative: 9.3 %
Neutro Abs: 4030 cells/uL (ref 1500–7800)
Neutrophils Relative %: 55.2 %
Platelets: 211 10*3/uL (ref 140–400)
RBC: 4.89 10*6/uL (ref 4.20–5.80)
RDW: 13.4 % (ref 11.0–15.0)
Total Lymphocyte: 33.4 %
WBC: 7.3 10*3/uL (ref 3.8–10.8)

## 2022-09-16 LAB — LIPID PANEL
Cholesterol: 190 mg/dL (ref <200)
HDL: 42 mg/dL (ref >=40)
LDL Cholesterol (Calc): 119 mg/dL (calc) — ABNORMAL HIGH
Non-HDL Cholesterol (Calc): 148 mg/dL (calc) — ABNORMAL HIGH (ref <130)
Total CHOL/HDL Ratio: 4.5 (calc) (ref <5.0)
Triglycerides: 171 mg/dL — ABNORMAL HIGH (ref <150)

## 2022-09-16 LAB — HIV-1 RNA QUANT-NO REFLEX-BLD
HIV 1 RNA Quant: NOT DETECTED Copies/mL
HIV-1 RNA Quant, Log: NOT DETECTED Log cps/mL

## 2022-09-16 LAB — RPR: RPR Ser Ql: NONREACTIVE

## 2022-09-29 ENCOUNTER — Ambulatory Visit: Payer: Self-pay | Admitting: Orthopaedic Surgery

## 2022-11-27 ENCOUNTER — Encounter (HOSPITAL_COMMUNITY): Payer: Self-pay

## 2022-11-27 ENCOUNTER — Other Ambulatory Visit: Payer: Self-pay

## 2022-11-27 ENCOUNTER — Emergency Department (HOSPITAL_COMMUNITY): Payer: Commercial Managed Care - HMO

## 2022-11-27 ENCOUNTER — Emergency Department (HOSPITAL_COMMUNITY)
Admission: EM | Admit: 2022-11-27 | Discharge: 2022-11-27 | Disposition: A | Discharge status: Home / Self Care | Payer: Commercial Managed Care - HMO | Attending: Emergency Medicine | Admitting: Emergency Medicine

## 2022-11-27 DIAGNOSIS — W19XXXA Unspecified fall, initial encounter: Secondary | ICD-10-CM | POA: Insufficient documentation

## 2022-11-27 DIAGNOSIS — Y92481 Parking lot as the place of occurrence of the external cause: Secondary | ICD-10-CM | POA: Diagnosis not present

## 2022-11-27 DIAGNOSIS — S299XXA Unspecified injury of thorax, initial encounter: Secondary | ICD-10-CM | POA: Diagnosis present

## 2022-11-27 DIAGNOSIS — S2241XA Multiple fractures of ribs, right side, initial encounter for closed fracture: Secondary | ICD-10-CM | POA: Diagnosis not present

## 2022-11-27 MED ORDER — OXYCODONE-ACETAMINOPHEN 5-325 MG PO TABS: 1.0000 | ORAL_TABLET | Freq: Four times a day (QID) | ORAL | 0 refills | Status: DC | PRN

## 2022-11-27 MED ORDER — OXYCODONE-ACETAMINOPHEN 5-325 MG PO TABS
1.0000 | ORAL_TABLET | Freq: Once | ORAL | Status: AC
  Administered 2022-11-27: 1 via ORAL
  Filled 2022-11-27: qty 1

## 2022-11-27 NOTE — Discharge Instructions (Addendum)
It was a pleasure taking care of you today!  Your xray showed concern for fractures (breaks in the bones) to your right 10th and 11th ribs. You will be sent a prescription for Percocet, take as needed for pain. You may take over the counter 600 mg ibuprofen every 6 hours and alternate with the percocet. You may place ice or heat to the affected area up to 15 minutes at a time.  Ensure to place a barrier between your skin and the ice/heat.  Use the incentive spirometer as directed.  You may follow-up with your care team as needed.  Return to the emergency department if you experience increasing/worsening symptoms.

## 2022-11-27 NOTE — ED Triage Notes (Signed)
Arrives EMS from home after a fall 2 days ago in the parking lot falling on a parking stop block.   C/o right rib pain.

## 2022-11-27 NOTE — ED Provider Notes (Signed)
[facility]-EMERGENCY DEPT Provider Note   CSN: [id number] Arrival date & time: 11/27/22  0454     History  Chief Complaint  Patient presents with   Marletta Lor    Gregory Grant is a 55 y.o. male who presents to the emergency department brought in by EMS with concerns for fall onset 2 days ago.  Patient notes that Gregory Grant had a mechanical fall due to his shoestrings which caused him to trip and fall landing on a parking stop block.  Has associated right rib pain.  Notes that his pain is worse with inspiration.  Has tried pain meds at home for his symptoms.  Denies chest pain, shortness of breath, hitting his head, LOC, nausea, vomiting.  The history is provided by the patient. No language interpreter was used.       Home Medications Prior to Admission medications   Medication Sig Start Date End Date Taking? Authorizing Provider  oxyCODONE-acetaminophen (PERCOCET/ROXICET) 5-325 MG tablet Take 1 tablet by mouth every 6 (six) hours as needed for severe pain. 11/27/22  Yes Paije Goodhart A, PA-C  bictegravir-emtricitabine-tenofovir AF (BIKTARVY) 50-200-25 MG TABS tablet Take 1 tablet by mouth daily. 03/13/22   Gardiner Barefoot, MD  famotidine (PEPCID) 20 MG tablet Take 1 tablet (20 mg total) by mouth 2 (two) times daily. To protect stomach 10/11/20 12/25/20  Fulp, Cammie, MD  losartan (COZAAR) 50 MG tablet Take 1 tablet (50 mg total) by mouth daily. To lower blood pressure 10/11/20 12/25/20  Cain Saupe, MD      Allergies    Patient has no known allergies.    Review of Systems   Review of Systems  All other systems reviewed and are negative.   Physical Exam Updated Vital Signs BP (!) 140/112 (BP Location: Right Arm)   Pulse (!) 102   Temp 97.6 F (36.4 C) (Oral)   Resp 18   Ht 5\' 8"  (1.727 m)   Wt 74.8 kg   SpO2 92%   BMI 25.09 kg/m  Physical Exam Vitals and nursing note reviewed.  Constitutional:      General: Gregory Grant is not in acute distress.    Appearance: Gregory Grant  is not diaphoretic.  HENT:     Head: Normocephalic and atraumatic.     Mouth/Throat:     Pharynx: No oropharyngeal exudate.  Eyes:     General: No scleral icterus.    Conjunctiva/sclera: Conjunctivae normal.  Cardiovascular:     Rate and Rhythm: Normal rate and regular rhythm.     Pulses: Normal pulses.     Heart sounds: Normal heart sounds.  Pulmonary:     Effort: Pulmonary effort is normal. No respiratory distress.     Breath sounds: Normal breath sounds. No wheezing.  Chest:     Chest wall: Tenderness present. No deformity or crepitus.     Comments: Tenderness to palpation noted to right posterior lateral ribs without overlying skin changes or obvious crepitus noted on exam. Abdominal:     General: Bowel sounds are normal.     Palpations: Abdomen is soft. There is no mass.     Tenderness: There is no abdominal tenderness. There is no guarding or rebound.  Musculoskeletal:        General: Normal range of motion.     Cervical back: Normal range of motion and neck supple.     Comments: No spinal tenderness to palpation.  Skin:    General: Skin is warm and dry.  Neurological:  Mental Status: Gregory Grant is alert.  Psychiatric:        Behavior: Behavior normal.     ED Results / Procedures / Treatments   Labs (all labs ordered are listed, but only abnormal results are displayed) Labs Reviewed - No data to display  EKG None  Radiology DG Ribs Unilateral W/Chest Right  Result Date: 11/27/2022 CLINICAL DATA:  Fall with right ribcage trauma. EXAM: RIGHT RIBS AND CHEST - 3+ VIEW COMPARISON:  PA Lat 03/30/2021 FINDINGS: There are mildly displaced acute fractures of the posterolateral right tenth and eleventh ribs. There is no other appreciable displaced rib fracture. No pneumothorax is seen. The sulci are sharp. There is linear atelectasis in the right lung base. The lungs are slightly emphysematous and otherwise clear. The cardiac size is normal. There is calcification of the  transverse aorta. Stable mediastinum. Slight thoracic levoscoliosis. Mild thoracic spondylosis. Normal bone mineralization. IMPRESSION: 1. Mildly displaced acute fractures of the posterolateral right tenth and eleventh ribs. No pneumothorax. 2. COPD. 3. Aortic atherosclerosis. Electronically Signed   By: Almira Bar M.D.   On: 11/27/2022 05:26    Procedures Procedures    Medications Ordered in ED Medications  oxyCODONE-acetaminophen (PERCOCET/ROXICET) 5-325 MG per tablet 1 tablet (1 tablet Oral Given 11/27/22 0912)    ED Course/ Medical Decision Making/ A&P Clinical Course as of 11/27/22 1001  Thu Nov 27, 2022  0954 Patient reevaluated and noted improvement of symptoms with treatment regimen in the ED.  Discussed with patient discharge treatment plan.  Patient appears safe for discharge at this time. [SB]    Clinical Course User Index [SB] Lorrine Killilea A, PA-C                           Medical Decision Making Amount and/or Complexity of Data Reviewed Radiology: ordered.  Risk Prescription drug management.   Pt with fall occurring 2 days ago. Denies hitting their head, LOC, headache, vision changes. Vital signs pt afebrile, slightly tachycardic. On exam, patient with Tenderness to palpation noted to right posterior lateral ribs without overlying skin changes or obvious crepitus noted on exam. No spinal TTP. No acute cardiovascular, respiratory, exam findings. Differential diagnosis includes fracture, dislocation, contusion, PTX.    Imaging: I ordered imaging studies including right rib unilateral xray I independently visualized and interpreted imaging which showed:  1. Mildly displaced acute fractures of the posterolateral right  tenth and eleventh ribs. No pneumothorax.  2. COPD.  3. Aortic atherosclerosis.   I agree with the radiologist interpretation  Medications:  I ordered medication including percocet, warm compress for pain management Reevaluation of the patient  after these medicines and interventions, I reevaluated the patient and found that they have improved I have reviewed the patients home medicines and have made adjustments as needed    Disposition: Presentation suspicious for rib fracture. Doubt contusion or dislocation at this time. Doubt PTX at this time.  After consideration of the diagnostic results and the patients response to treatment, I feel that the patient would benefit from Discharge home.  PDMP reviewed, pt sent with a short course of Percocet. Incentive spirometer given today.  Patient instructed on its use. Supportive care and strict return precautions discussed with patient. Appears safe for discharge at this time. Follow up as indicated in discharge paperwork.   This chart was dictated using voice recognition software, Dragon. Despite the best efforts of this provider to proofread and correct errors, errors may still occur  which can change documentation meaning.   Final Clinical Impression(s) / ED Diagnoses Final diagnoses:  Fall, initial encounter  Closed fracture of multiple ribs of right side, initial encounter    Rx / DC Orders ED Discharge Orders          Ordered    oxyCODONE-acetaminophen (PERCOCET/ROXICET) 5-325 MG tablet  Every 6 hours PRN        11/27/22 0958              Eyleen Rawlinson A, PA-C 11/27/22 1005    Terrilee Files, MD 11/27/22 2043

## 2023-02-24 ENCOUNTER — Telehealth: Payer: Self-pay

## 2023-02-24 ENCOUNTER — Other Ambulatory Visit: Payer: Self-pay | Admitting: Internal Medicine

## 2023-02-24 DIAGNOSIS — B20 Human immunodeficiency virus [HIV] disease: Secondary | ICD-10-CM

## 2023-02-24 NOTE — Telephone Encounter (Signed)
Patient called requesting refill on Bik. States that he went to pharmacy and was told they did not have active prescription and recommended he call office for refill. Refill sent today. Requested patient follow up with [facility] in an hour. Will call if he continues to have issues getting medication filled. Leatrice Jewels, RMA

## 2023-03-12 ENCOUNTER — Other Ambulatory Visit: Payer: Self-pay

## 2023-03-12 ENCOUNTER — Ambulatory Visit (INDEPENDENT_AMBULATORY_CARE_PROVIDER_SITE_OTHER): Payer: Commercial Managed Care - HMO | Admitting: Internal Medicine

## 2023-03-12 ENCOUNTER — Encounter: Payer: Self-pay | Admitting: Internal Medicine

## 2023-03-12 VITALS — BP 135/90 | HR 75 | Temp 97.5°F | Wt 170.0 lb

## 2023-03-12 DIAGNOSIS — Z5181 Encounter for therapeutic drug level monitoring: Secondary | ICD-10-CM

## 2023-03-12 DIAGNOSIS — B20 Human immunodeficiency virus [HIV] disease: Secondary | ICD-10-CM

## 2023-03-12 DIAGNOSIS — E785 Hyperlipidemia, unspecified: Secondary | ICD-10-CM | POA: Diagnosis not present

## 2023-03-12 NOTE — Assessment & Plan Note (Signed)
Statin treatment is indicated with history of CAD.  Recommended he continues to follow up with his PCP

## 2023-03-12 NOTE — Progress Notes (Signed)
   Subjective:    Patient ID: Gregory Grant, male    DOB: [birth year], 57 y.o.   MRN: [id number]  HPI Here for follow up of HIV Gregory Grant remains on biktarvy with no missed doses. Recently fell and has two broken ribs.  No issues getting or taking his medication.    Review of Systems  Constitutional:  Negative for fatigue.  Gastrointestinal:  Negative for diarrhea.  Skin:  Negative for rash.       Objective:   Physical Exam Eyes:     General: No scleral icterus. Pulmonary:     Effort: Pulmonary effort is normal.  Skin:    Findings: No rash.  Neurological:     Mental Status: He is alert.   SH: + tobacco        Assessment & Plan:

## 2023-03-12 NOTE — Assessment & Plan Note (Signed)
Creat, LFTs, wnl.   

## 2023-03-12 NOTE — Assessment & Plan Note (Signed)
He is doing well, no concerns.  Labs today and follow up in 6 months.

## 2023-03-14 LAB — T-HELPER CELLS (CD4) COUNT (NOT AT ARMC)
Absolute CD4: 1525 cells/uL (ref 490–1740)
CD4 T Helper %: 55 % (ref 30–61)
Total lymphocyte count: 2776 cells/uL (ref 850–3900)

## 2023-03-14 LAB — HIV-1 RNA QUANT-NO REFLEX-BLD
HIV 1 RNA Quant: NOT DETECTED Copies/mL
HIV-1 RNA Quant, Log: NOT DETECTED Log cps/mL

## 2023-04-21 ENCOUNTER — Other Ambulatory Visit: Payer: Self-pay | Admitting: Internal Medicine

## 2023-04-21 DIAGNOSIS — B20 Human immunodeficiency virus [HIV] disease: Secondary | ICD-10-CM

## 2023-05-27 ENCOUNTER — Other Ambulatory Visit: Payer: Self-pay

## 2023-05-27 ENCOUNTER — Emergency Department (HOSPITAL_COMMUNITY): Payer: Commercial Managed Care - HMO

## 2023-05-27 ENCOUNTER — Emergency Department (HOSPITAL_COMMUNITY)
Admission: EM | Admit: 2023-05-27 | Discharge: 2023-05-27 | Disposition: A | Discharge status: Home / Self Care | Payer: Commercial Managed Care - HMO | Attending: Emergency Medicine | Admitting: Emergency Medicine

## 2023-05-27 ENCOUNTER — Encounter (HOSPITAL_COMMUNITY): Payer: Self-pay

## 2023-05-27 DIAGNOSIS — Z955 Presence of coronary angioplasty implant and graft: Secondary | ICD-10-CM | POA: Insufficient documentation

## 2023-05-27 DIAGNOSIS — Z21 Asymptomatic human immunodeficiency virus [HIV] infection status: Secondary | ICD-10-CM | POA: Diagnosis not present

## 2023-05-27 DIAGNOSIS — R61 Generalized hyperhidrosis: Secondary | ICD-10-CM | POA: Insufficient documentation

## 2023-05-27 DIAGNOSIS — R079 Chest pain, unspecified: Secondary | ICD-10-CM

## 2023-05-27 DIAGNOSIS — K2900 Acute gastritis without bleeding: Secondary | ICD-10-CM | POA: Insufficient documentation

## 2023-05-27 DIAGNOSIS — F17200 Nicotine dependence, unspecified, uncomplicated: Secondary | ICD-10-CM | POA: Insufficient documentation

## 2023-05-27 DIAGNOSIS — I2510 Atherosclerotic heart disease of native coronary artery without angina pectoris: Secondary | ICD-10-CM | POA: Diagnosis not present

## 2023-05-27 DIAGNOSIS — R112 Nausea with vomiting, unspecified: Secondary | ICD-10-CM | POA: Diagnosis present

## 2023-05-27 LAB — HEPATIC FUNCTION PANEL
ALT: 15 U/L (ref 0–44)
AST: 19 U/L (ref 15–41)
Albumin: 3.6 g/dL (ref 3.5–5.0)
Alkaline Phosphatase: 62 U/L (ref 38–126)
Bilirubin, Direct: 0.1 mg/dL (ref 0.0–0.2)
Indirect Bilirubin: 0.7 mg/dL (ref 0.3–0.9)
Total Bilirubin: 0.8 mg/dL (ref 0.3–1.2)
Total Protein: 6.7 g/dL (ref 6.5–8.1)

## 2023-05-27 LAB — BASIC METABOLIC PANEL
Anion gap: 13 (ref 5–15)
BUN: 9 mg/dL (ref 6–20)
CO2: 18 mmol/L — ABNORMAL LOW (ref 22–32)
Calcium: 9.3 mg/dL (ref 8.9–10.3)
Chloride: 104 mmol/L (ref 98–111)
Creatinine, Ser: 0.88 mg/dL (ref 0.61–1.24)
GFR, Estimated: >60 mL/min (ref >60)
Glucose, Bld: 103 mg/dL — ABNORMAL HIGH (ref 70–99)
Potassium: 3.9 mmol/L (ref 3.5–5.1)
Sodium: 135 mmol/L (ref 135–145)

## 2023-05-27 LAB — TROPONIN I (HIGH SENSITIVITY)
Troponin I (High Sensitivity): 8 ng/L (ref <18)
Troponin I (High Sensitivity): 9 ng/L (ref <18)

## 2023-05-27 LAB — CBC
HCT: 46.3 % (ref 39.0–52.0)
Hemoglobin: 15.8 g/dL (ref 13.0–17.0)
MCH: 33.9 pg (ref 26.0–34.0)
MCHC: 34.1 g/dL (ref 30.0–36.0)
MCV: 99.4 fL (ref 80.0–100.0)
Platelets: 201 10*3/uL (ref 150–400)
RBC: 4.66 MIL/uL (ref 4.22–5.81)
RDW: 14.5 % (ref 11.5–15.5)
WBC: 7.3 10*3/uL (ref 4.0–10.5)
nRBC: 0 % (ref 0.0–0.2)

## 2023-05-27 LAB — LIPASE, BLOOD: Lipase: 32 U/L (ref 11–51)

## 2023-05-27 MED ORDER — PANTOPRAZOLE SODIUM 40 MG IV SOLR
40.0000 mg | Freq: Once | INTRAVENOUS | Status: AC
  Administered 2023-05-27: 40 mg via INTRAVENOUS
  Filled 2023-05-27: qty 10

## 2023-05-27 MED ORDER — FENTANYL CITRATE PF 50 MCG/ML IJ SOSY
50.0000 ug | PREFILLED_SYRINGE | Freq: Once | INTRAMUSCULAR | Status: AC
  Administered 2023-05-27: 50 ug via INTRAVENOUS
  Filled 2023-05-27: qty 1

## 2023-05-27 MED ORDER — ONDANSETRON HCL 4 MG/2ML IJ SOLN
4.0000 mg | Freq: Once | INTRAMUSCULAR | Status: AC
  Administered 2023-05-27: 4 mg via INTRAVENOUS
  Filled 2023-05-27: qty 2

## 2023-05-27 MED ORDER — ALUM & MAG HYDROXIDE-SIMETH 200-200-20 MG/5ML PO SUSP
30.0000 mL | Freq: Once | ORAL | Status: AC
  Administered 2023-05-27: 30 mL via ORAL
  Filled 2023-05-27: qty 30

## 2023-05-27 MED ORDER — PANTOPRAZOLE SODIUM 20 MG PO TBEC: 20.0000 mg | DELAYED_RELEASE_TABLET | Freq: Every day | ORAL | 0 refills | Status: DC

## 2023-05-27 MED ORDER — SODIUM CHLORIDE 0.9 % IV BOLUS
500.0000 mL | Freq: Once | INTRAVENOUS | Status: AC
  Administered 2023-05-27: 500 mL via INTRAVENOUS

## 2023-05-27 MED ORDER — IOHEXOL 350 MG/ML SOLN
75.0000 mL | Freq: Once | INTRAVENOUS | Status: AC | PRN
  Administered 2023-05-27: 75 mL via INTRAVENOUS

## 2023-05-27 NOTE — ED Triage Notes (Signed)
Chest pain that radiates down left arm causing hand to go numb.  Started this am.  Described as stabbing,  Excessive belching, hiccups and vomiting.

## 2023-05-27 NOTE — ED Notes (Signed)
PO challenge completed pt tolerated well with no issues. MD notified

## 2023-05-27 NOTE — ED Provider Notes (Signed)
EMERGENCY DEPARTMENT AT [facility] Provider Note   CSN: [id number] Arrival date & time: 05/27/23  1458     History  Chief Complaint  Patient presents with   Abdominal Pain    Rex Sullo is a 57 y.o. male.  Patient is a 57 year old male with a history of HIV on Biktarvy, hyperlipidemia, coronary artery disease status post stent placement in 2020.  He is a daily smoker and he drinks alcohol daily although he has not had any today.  He reports chest pain that started about 8:00 this morning.  He says it is in the center of his chest.  He denies any radiation to me although he told the triage nurse that was radiating down his left arm.  He is also had some hiccups and associated nausea and vomiting.  He has some pain across his upper abdomen as well.  No associated back pain.  He said it did get worse after walking although it does seem to be also worse after vomiting.  No associated shortness of breath.  He did get diaphoretic with the vomiting but otherwise no diaphoresis.  He did get some medicine in triage what sounds like ODT Zofran and said he was feeling better but now he is feeling nauseated again.  He feels like he is going to throw up.  He has had some associated hiccups that had resolved with the medication but now have started to come back again.       Home Medications Prior to Admission medications   Medication Sig Start Date End Date Taking? Authorizing Provider  pantoprazole (PROTONIX) 20 MG tablet Take 1 tablet (20 mg total) by mouth daily. 05/27/23  Yes Rolan Bucco, MD  bictegravir-emtricitabine-tenofovir AF (BIKTARVY) 50-200-25 MG TABS tablet TAKE 1 TABLET BY MOUTH DAILY 04/21/23   Comer, Belia Heman, MD  famotidine (PEPCID) 20 MG tablet Take 1 tablet (20 mg total) by mouth 2 (two) times daily. To protect stomach 10/11/20 12/25/20  Fulp, Cammie, MD  losartan (COZAAR) 50 MG tablet Take 1 tablet (50 mg total) by mouth daily. To lower blood pressure  10/11/20 12/25/20  Cain Saupe, MD      Allergies    Patient has no known allergies.    Review of Systems   Review of Systems  Constitutional:  Positive for diaphoresis. Negative for chills, fatigue and fever.  HENT:  Negative for congestion, rhinorrhea and sneezing.   Eyes: Negative.   Respiratory:  Negative for cough, chest tightness and shortness of breath.   Cardiovascular:  Positive for chest pain. Negative for leg swelling.  Gastrointestinal:  Positive for nausea and vomiting. Negative for abdominal pain, blood in stool and diarrhea.  Genitourinary:  Negative for difficulty urinating, flank pain, frequency and hematuria.  Musculoskeletal:  Negative for arthralgias and back pain.  Skin:  Negative for rash.  Neurological:  Negative for dizziness, speech difficulty, weakness, numbness and headaches.    Physical Exam Updated Vital Signs BP 131/84   Pulse 60   Temp 98.2 F (36.8 C) (Oral)   Resp 16   Ht 5\' 8"  (1.727 m)   Wt 77.1 kg   SpO2 100%   BMI 25.85 kg/m  Physical Exam Constitutional:      Appearance: He is well-developed. He is diaphoretic.     Comments: Vomiting  HENT:     Head: Normocephalic and atraumatic.  Eyes:     Pupils: Pupils are equal, round, and reactive to light.  Cardiovascular:  Rate and Rhythm: Normal rate and regular rhythm.     Heart sounds: Normal heart sounds.  Pulmonary:     Effort: Pulmonary effort is normal. No respiratory distress.     Breath sounds: Normal breath sounds. No wheezing or rales.  Chest:     Chest wall: No tenderness.  Abdominal:     General: Bowel sounds are normal.     Palpations: Abdomen is soft.     Tenderness: There is abdominal tenderness in the epigastric area. There is no guarding or rebound.  Musculoskeletal:        General: Normal range of motion.     Cervical back: Normal range of motion and neck supple.  Lymphadenopathy:     Cervical: No cervical adenopathy.  Skin:    General: Skin is warm.      Findings: No rash.     Comments: No edema or calf tenderness  Neurological:     Mental Status: He is alert and oriented to person, place, and time.     ED Results / Procedures / Treatments   Labs (all labs ordered are listed, but only abnormal results are displayed) Labs Reviewed  BASIC METABOLIC PANEL - Abnormal; Notable for the following components:      Result Value   CO2 18 (*)    Glucose, Bld 103 (*)    All other components within normal limits  CBC  LIPASE, BLOOD  HEPATIC FUNCTION PANEL  TROPONIN I (HIGH SENSITIVITY)  TROPONIN I (HIGH SENSITIVITY)    EKG EKG Interpretation  Date/Time:  Wednesday May 27 2023 16:12:01 EDT Ventricular Rate:  71 PR Interval:  148 QRS Duration: 70 QT Interval:  384 QTC Calculation: 417 R Axis:   -59 Text Interpretation: Normal sinus rhythm with sinus arrhythmia Left axis deviation Abnormal ECG When compared with ECG of 27-May-2023 14:57, PREVIOUS ECG IS PRESENT since last tracing no significant change Confirmed by Rolan Bucco [phone]) on 05/27/2023 4:28:26 PM  Radiology CT ABDOMEN PELVIS W CONTRAST  Result Date: 05/27/2023 CLINICAL DATA:  Abdominal pain with excessive belching, hiccups and vomiting. EXAM: CT ABDOMEN AND PELVIS WITH CONTRAST TECHNIQUE: Multidetector CT imaging of the abdomen and pelvis was performed using the standard protocol following bolus administration of intravenous contrast. RADIATION DOSE REDUCTION: This exam was performed according to the departmental dose-optimization program which includes automated exposure control, adjustment of the mA and/or kV according to patient size and/or use of iterative reconstruction technique. CONTRAST:  75mL OMNIPAQUE IOHEXOL 350 MG/ML SOLN COMPARISON:  None Available. FINDINGS: Lower chest: No acute abnormality. Hepatobiliary: There is diffuse fatty infiltration of the liver parenchyma. No focal liver abnormality is seen. No gallstones, gallbladder wall thickening, or biliary  dilatation. Pancreas: Unremarkable. No pancreatic ductal dilatation or surrounding inflammatory changes. Spleen: Normal in size without focal abnormality. Adrenals/Urinary Tract: Mild, diffuse bilateral adrenal gland enlargement is noted, left slightly greater than right. Kidneys are normal, without renal calculi, focal lesion, or hydronephrosis. The urinary bladder is poorly distended and subsequently limited in evaluation. Mild urinary bladder wall thickening is noted. Stomach/Bowel: Stomach is within normal limits. Appendix appears normal (axial CT images 48 through 53, CT series 3). No evidence of bowel wall thickening, distention, or inflammatory changes. Noninflamed diverticula are seen throughout the proximal and mid sigmoid colon. Vascular/Lymphatic: Aortic atherosclerosis. No enlarged abdominal or pelvic lymph nodes. Reproductive: Prostate gland is mildly enlarged. Other: No abdominal wall hernia or abnormality. No abdominopelvic ascites. Musculoskeletal: No acute or significant osseous findings. IMPRESSION: 1. Hepatic steatosis. 2. Sigmoid  diverticulosis. 3. Aortic atherosclerosis. Aortic Atherosclerosis (ICD10-I70.0). Electronically Signed   By: Aram Candela M.D.   On: 05/27/2023 18:25   DG Chest 2 View  Result Date: 05/27/2023 CLINICAL DATA:  Mediastinal chest pain EXAM: CHEST - 2 VIEW COMPARISON:  11/27/2022 FINDINGS: Cardiac and mediastinal contours are within normal limits. No focal pulmonary opacity. No pleural effusion or pneumothorax. No acute osseous abnormality. IMPRESSION: No acute cardiopulmonary process. Electronically Signed   By: Wiliam Ke M.D.   On: 05/27/2023 16:10    Procedures Procedures    Medications Ordered in ED Medications  alum & mag hydroxide-simeth (MAALOX/MYLANTA) 200-200-20 MG/5ML suspension 30 mL (has no administration in time range)  ondansetron (ZOFRAN) injection 4 mg (4 mg Intravenous Given 05/27/23 1705)  pantoprazole (PROTONIX) injection 40 mg (40 mg  Intravenous Given 05/27/23 1707)  sodium chloride 0.9 % bolus 500 mL (0 mLs Intravenous Stopped 05/27/23 1804)  fentaNYL (SUBLIMAZE) injection 50 mcg (50 mcg Intravenous Given 05/27/23 1706)  iohexol (OMNIPAQUE) 350 MG/ML injection 75 mL (75 mLs Intravenous Contrast Given 05/27/23 1807)    ED Course/ Medical Decision Making/ A&P                             Medical Decision Making Amount and/or Complexity of Data Reviewed Labs: ordered. Radiology: ordered.  Risk OTC drugs. Prescription drug management.   Patient is a 57 year old male who presents with pain across his upper abdomen going up into his chest.  He is a daily drinker but has not had any alcohol today.  No signs of withdrawal.  He had some associated vomiting.  No shortness of breath.  His pain was reproducible across his upper abdomen.  His labs do not reveal any evidence of pancreatitis.  His hemoglobin is stable.  His troponins are negative.  His EKG does not show any ischemic changes.  Chest x-ray two-view was performed.  This was interpreted by me and confirmed by the radiologist to show no acute abnormalities.  No pulmonary edema.  No pneumonia.  No pneumothorax.  No free air.  He had a CT scan of his abdomen pelvis which shows no acute abnormality.  He was given IV fluids and some Protonix as well as 1 dose of fentanyl.  He is feeling much better after this.  His hiccups are resolved.  His vomiting has resolved.  His pain has resolved.  He tolerates p.o. fluids without vomiting.  I suspect his symptoms are coming from gastritis.  Will start him on Protonix.  He was advised that he needs to follow-up with a cardiologist given that he has not had good follow-up since he had the stent placement and since that he had some chest pain even though it seems to be more associate with gastritis, it would be prudent to follow-up with cardiology.  He was discharged home in good condition.  Return precautions were given.  Final Clinical  Impression(s) / ED Diagnoses Final diagnoses:  Acute gastritis without hemorrhage, unspecified gastritis type  Chest pain, unspecified type    Rx / DC Orders ED Discharge Orders          Ordered    pantoprazole (PROTONIX) 20 MG tablet  Daily        05/27/23 1959              Rolan Bucco, MD 05/27/23 2002

## 2023-05-27 NOTE — Discharge Instructions (Addendum)
You need to make an appointment to follow-up with the cardiology group listed above.  Return to the emergency room if you have any worsening symptoms.

## 2023-05-28 NOTE — Progress Notes (Signed)
The ASCVD Risk score (Arnett DK, et al., 2019) failed to calculate for the following reasons:   The patient has a prior MI or stroke diagnosis  Sandie Ano, RN

## 2023-08-02 ENCOUNTER — Encounter: Payer: Self-pay | Admitting: Cardiovascular Disease

## 2023-08-02 NOTE — Progress Notes (Unsigned)
  Cardiology Office Note:  .   Date:  08/03/2023  ID:  Gregory Grant, DOB 19-Feb-1966, MRN [id number] PCP: Pcp, No  [facility] Providers Cardiologist:  None    History of Present Illness: .   Gregory Grant is a 57 y.o. male with hx of cigarette smoking , HIV Hx of acute gastritis,  Recent episodes of CP ( 2 months ago )   Was last seen by Dr Herbie Baltimore in July 2020 for an acute Inf. Wall STEMI.  Had stenting of the prox and mid RCA Had moderate - severe disease in the LCx that was treated medically to be treated in a staged procedure several weeks later   He never showed up for his office visit   He has had some cp on occasion Last episode was 2 month ago , exertional CP  Ran out of his NTG years ago  Not much exercise   Smokes 1/2 ppd ( down from 1 PPD)   Off ASA due to GI bleeding in June  Is off all his meds except is HIV meds.    Plan  Will start metoprolol 25 BID Rosuvastatin 10 mg a day Refll NTG 0.4 mg SL PRN   No ASA due to recent GI bleeding  Lipids and alt in 3 months  Office visit with APP in 6 months           ROS:   Studies Reviewed: .         Risk Assessment/Calculations:             Physical Exam:   VS:  BP 118/72   Pulse 99   Ht 5\' 8"  (1.727 m)   Wt 166 lb 3.2 oz (75.4 kg)   SpO2 98%   BMI 25.27 kg/m    Wt Readings from Last 3 Encounters:  08/03/23 166 lb 3.2 oz (75.4 kg)  05/27/23 170 lb (77.1 kg)  03/12/23 170 lb (77.1 kg)    GEN: Well nourished, well developed in no acute distress NECK: No JVD; No carotid bruits CARDIAC: RRR, no murmurs, rubs, gallops RESPIRATORY:  Clear to auscultation without rales, wheezing or rhonchi  ABDOMEN: Soft, non-tender, non-distended EXTREMITIES:  No edema; No deformity   ASSESSMENT AND PLAN: .   1.  History of coronary artery disease/status post stenting: Patient had 1 episode of chest pain approximately 2 months ago.  Has not had any since that time.  His recent course has also  been complicated by recent acute GI bleed.  He is off all of his medications.  I do not think that we can restart aspirin because of his recent GI bleed.  Will start metoprolol 25 mg twice a day.  Will also start rosuvastatin 10 mg a day.  Will check lipids and ALT in 3 months.  Refilled nitroglycerin 0.4 mg sublingual as needed.  Advised smoking cessation    Follow-up with an APP in 6 months.   2.HIV :  follow up with ID            Dispo: 1 year    Signed, Kristeen Miss, MD

## 2023-08-03 ENCOUNTER — Encounter: Payer: Self-pay | Admitting: Cardiovascular Disease

## 2023-08-03 ENCOUNTER — Ambulatory Visit: Payer: Commercial Managed Care - HMO | Attending: Cardiovascular Disease | Admitting: Cardiovascular Disease

## 2023-08-03 VITALS — BP 118/72 | HR 99 | Ht 68.0 in | Wt 166.2 lb

## 2023-08-03 DIAGNOSIS — Z79899 Other long term (current) drug therapy: Secondary | ICD-10-CM

## 2023-08-03 DIAGNOSIS — E785 Hyperlipidemia, unspecified: Secondary | ICD-10-CM

## 2023-08-03 DIAGNOSIS — I25110 Atherosclerotic heart disease of native coronary artery with unstable angina pectoris: Secondary | ICD-10-CM

## 2023-08-03 MED ORDER — NITROGLYCERIN 0.4 MG SL SUBL
0.4000 mg | SUBLINGUAL_TABLET | SUBLINGUAL | 4 refills | Status: DC | PRN
Start: 2023-08-03 — End: 2024-07-11

## 2023-08-03 MED ORDER — ROSUVASTATIN CALCIUM 10 MG PO TABS
10.0000 mg | ORAL_TABLET | Freq: Every day | ORAL | 3 refills | Status: DC
Start: 2023-08-03 — End: 2024-04-13

## 2023-08-03 MED ORDER — METOPROLOL TARTRATE 25 MG PO TABS
25.0000 mg | ORAL_TABLET | Freq: Two times a day (BID) | ORAL | 2 refills | Status: DC
Start: 2023-08-03 — End: 2024-07-11

## 2023-08-03 NOTE — Patient Instructions (Signed)
Medication Instructions:   START TAKING METOPROLOL TARTRATE 25 MG BY MOUTH TWICE DAILY  START TAKING ROSUVASTATIN (CRESTOR) 10 MG BY MOUTH DAILY  START USING NITROGLYCERIN 0.4 MG SUBLINGUAL (UNDER THE TONGUE) --PLACE ONE TABLET UNDER THE TONGUE EVERY 5 MINS AS NEEDED FOR CHEST PAIN--IF YOU ARE GETTING TO THE 3RD TABLET WITH NO CHEST PAIN RELIEF, PLEASE CALL 911  *If you need a refill on your cardiac medications before your next appointment, please call your pharmacy*   Lab Work:  3 MONTHS HERE IN THE OFFICE--LIPIDS AND ALT  If you have labs (blood work) drawn today and your tests are completely normal, you will receive your results only by: MyChart Message (if you have MyChart) OR A paper copy in the mail If you have any lab test that is abnormal or we need to change your treatment, we will call you to review the results.    Follow-Up: At [facility], you and your health needs are our priority.  As part of our continuing mission to provide you with exceptional heart care, we have created designated Provider Care Teams.  These Care Teams include your primary Cardiologist (physician) and Advanced Practice Providers (APPs -  Physician Assistants and Nurse Practitioners) who all work together to provide you with the care you need, when you need it.  We recommend signing up for the patient portal called "MyChart".  Sign up information is provided on this After Visit Summary.  MyChart is used to connect with patients for Virtual Visits (Telemedicine).  Patients are able to view lab/test results, encounter notes, upcoming appointments, etc.  Non-urgent messages can be sent to your provider as well.   To learn more about what you can do with MyChart, go to [email or web address].    Your next appointment:   6 month(s)  Provider:    WITH AN EXTENDER IN THE OFFICE

## 2023-08-25 ENCOUNTER — Other Ambulatory Visit: Payer: Commercial Managed Care - HMO

## 2023-08-25 ENCOUNTER — Other Ambulatory Visit (HOSPITAL_COMMUNITY)
Admission: RE | Admit: 2023-08-25 | Discharge: 2023-08-25 | Disposition: A | Discharge status: Home / Self Care | Payer: Commercial Managed Care - HMO | Source: Ambulatory Visit | Attending: Internal Medicine | Admitting: Internal Medicine

## 2023-08-25 ENCOUNTER — Other Ambulatory Visit: Payer: Self-pay

## 2023-08-25 DIAGNOSIS — Z113 Encounter for screening for infections with a predominantly sexual mode of transmission: Secondary | ICD-10-CM | POA: Insufficient documentation

## 2023-08-25 DIAGNOSIS — B20 Human immunodeficiency virus [HIV] disease: Secondary | ICD-10-CM | POA: Diagnosis present

## 2023-08-26 LAB — URINE CYTOLOGY ANCILLARY ONLY
Chlamydia: NEGATIVE
Comment: NEGATIVE
Comment: NORMAL
Neisseria Gonorrhea: NEGATIVE

## 2023-08-27 LAB — COMPLETE METABOLIC PANEL WITH GFR
AG Ratio: 1.5 (calc) (ref 1.0–2.5)
ALT: 8 U/L — ABNORMAL LOW (ref 9–46)
AST: 13 U/L (ref 10–35)
Albumin: 4.5 g/dL (ref 3.6–5.1)
Alkaline phosphatase (APISO): 83 U/L (ref 35–144)
BUN: 8 mg/dL (ref 7–25)
CO2: 21 mmol/L (ref 20–32)
Calcium: 10 mg/dL (ref 8.6–10.3)
Chloride: 101 mmol/L (ref 98–110)
Creat: 1.15 mg/dL (ref 0.70–1.30)
Globulin: 3 g/dL (ref 1.9–3.7)
Glucose, Bld: 82 mg/dL (ref 65–99)
Potassium: 3.8 mmol/L (ref 3.5–5.3)
Sodium: 137 mmol/L (ref 135–146)
Total Bilirubin: 0.5 mg/dL (ref 0.2–1.2)
Total Protein: 7.5 g/dL (ref 6.1–8.1)
eGFR: 75 mL/min/{1.73_m2} (ref >=60)

## 2023-08-27 LAB — CBC WITH DIFFERENTIAL/PLATELET
Absolute Monocytes: 533 {cells}/uL (ref 200–950)
Basophils Absolute: 28 {cells}/uL (ref 0–200)
Basophils Relative: 0.4 %
Eosinophils Absolute: 142 {cells}/uL (ref 15–500)
Eosinophils Relative: 2 %
HCT: 48.9 % (ref 38.5–50.0)
Hemoglobin: 17.2 g/dL — ABNORMAL HIGH (ref 13.2–17.1)
Lymphs Abs: 2194 {cells}/uL (ref 850–3900)
MCH: 34.6 pg — ABNORMAL HIGH (ref 27.0–33.0)
MCHC: 35.2 g/dL (ref 32.0–36.0)
MCV: 98.4 fL (ref 80.0–100.0)
MPV: 11.3 fL (ref 7.5–12.5)
Monocytes Relative: 7.5 %
Neutro Abs: 4203 {cells}/uL (ref 1500–7800)
Neutrophils Relative %: 59.2 %
Platelets: 160 10*3/uL (ref 140–400)
RBC: 4.97 10*6/uL (ref 4.20–5.80)
RDW: 13 % (ref 11.0–15.0)
Total Lymphocyte: 30.9 %
WBC: 7.1 10*3/uL (ref 3.8–10.8)

## 2023-08-27 LAB — HIV-1 RNA QUANT-NO REFLEX-BLD
HIV 1 RNA Quant: <20 {copies}/mL — ABNORMAL HIGH
HIV-1 RNA Quant, Log: <1.3 {Log_copies}/mL — ABNORMAL HIGH

## 2023-08-27 LAB — RPR: RPR Ser Ql: NONREACTIVE

## 2023-08-27 LAB — T-HELPER CELL (CD4) - (RCID CLINIC ONLY)
CD4 % Helper T Cell: 52 % (ref 33–65)
CD4 T Cell Abs: 1005 /uL (ref 400–1790)

## 2023-09-04 ENCOUNTER — Ambulatory Visit (INDEPENDENT_AMBULATORY_CARE_PROVIDER_SITE_OTHER): Payer: Commercial Managed Care - HMO | Admitting: Internal Medicine

## 2023-09-04 ENCOUNTER — Encounter: Payer: Self-pay | Admitting: Internal Medicine

## 2023-09-04 ENCOUNTER — Other Ambulatory Visit: Payer: Self-pay

## 2023-09-04 VITALS — BP 156/90 | HR 76 | Temp 98.3°F | Ht 68.0 in | Wt 163.0 lb

## 2023-09-04 DIAGNOSIS — B20 Human immunodeficiency virus [HIV] disease: Secondary | ICD-10-CM

## 2023-09-04 DIAGNOSIS — I739 Peripheral vascular disease, unspecified: Secondary | ICD-10-CM | POA: Insufficient documentation

## 2023-09-04 DIAGNOSIS — Z23 Encounter for immunization: Secondary | ICD-10-CM | POA: Diagnosis not present

## 2023-09-04 DIAGNOSIS — Z113 Encounter for screening for infections with a predominantly sexual mode of transmission: Secondary | ICD-10-CM

## 2023-09-04 DIAGNOSIS — Z716 Tobacco abuse counseling: Secondary | ICD-10-CM | POA: Diagnosis not present

## 2023-09-04 MED ORDER — BIKTARVY 50-200-25 MG PO TABS: 1.0000 | ORAL_TABLET | Freq: Every day | ORAL | 11 refills | Status: DC

## 2023-09-04 NOTE — Assessment & Plan Note (Signed)
He describes what sounds like claudication of both extremities.  Mainly with activity but also while sleeping so concern for claudication at rest.  Will refer him to vascular surgery.  We discussed the need for smoking cessation.

## 2023-09-04 NOTE — Assessment & Plan Note (Signed)
Screened negative

## 2023-09-04 NOTE — Assessment & Plan Note (Signed)
Discussed the need for him to quit.

## 2023-09-04 NOTE — Progress Notes (Signed)
Subjective:    Patient ID: Gregory Grant, male    DOB: [birth year], 57 y.o.   MRN: [id number]  HPI [name] is here for follow up of HIV He continues on biktarvy and denies any missed doses.  No issues with getting or taking his medication.  He is having issues with both his legs - signficant pain in calves with acitivity.  Feels like burning.  Sometimes wakes him from sleep.     Review of Systems  Constitutional:  Negative for fatigue.  Gastrointestinal:  Negative for diarrhea.  Skin:  Negative for rash.       Objective:   Physical Exam Eyes:     General: No scleral icterus. Pulmonary:     Effort: Pulmonary effort is normal.  Neurological:     Mental Status: He is alert.   SH: + tobacco        Assessment & Plan:

## 2023-09-04 NOTE — Assessment & Plan Note (Signed)
He continues to do well on Biktarvy and no changes indicated. Refills provided. Labs reviewed with him He can follow up in 6 months.

## 2023-09-15 ENCOUNTER — Other Ambulatory Visit: Payer: Self-pay | Admitting: *Deleted

## 2023-09-15 DIAGNOSIS — I739 Peripheral vascular disease, unspecified: Secondary | ICD-10-CM

## 2023-09-28 NOTE — Progress Notes (Unsigned)
[facility] [place]  ASSESSMENT / PLAN: Gregory Grant is a 57 y.o. 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  Patient counseled patients with asymptomatic peripheral arterial disease or claudication have a 1-2% risk of developing chronic limb threatening ischemia, but a 15-30% risk of mortality in the next 5 years. Intervention should only be considered for medically optimized patients with disabling symptoms.   Recommend:  Abstinence from all tobacco products. Blood glucose control with goal A1c < 7%. Blood pressure control with goal blood pressure < 140/90 mmHg. Lipid reduction therapy with goal LDL-C <100 mg/dL  Aspirin 81mg  PO QD.  Atorvastatin 40-80mg  PO QD (or other "high intensity" statin therapy). Daily walking to and past the point of discomfort. Patient counseled to keep a log of exercise distance.  Patient counseled he should not undergo intervention unless he has quit smoking. Will see again in 2-3 months after trial of medical therapy.  CHIEF COMPLAINT: right leg pain with walking  HISTORY OF PRESENT ILLNESS: Gregory Grant is a 57 y.o. male who presents to clinic for evaluation of pain in the right leg with walking.  The patient gives a fairly classic history of claudication.  Reports cramping discomfort begins in his calf at about a block.  The pain continues until he rests.  The pain is limiting his ability to do the things he likes to do, but does not limit him from doing necessary things like grocery shopping.  He does not describe rest pain.  He has no ulcers about his feet.  He has well-controlled HIV disease and currently has an undetectable viral load.  VASCULAR SURGICAL HISTORY: none  VASCULAR RISK FACTORS: Negative history of stroke / transient ischemic attack. Positive history of coronary artery disease. + history of PCI.  Negative history of diabetes mellitus.  Positive  history of smoking. + actively smoking. Negative history of hypertension.  Negative history of chronic kidney disease.   Negative history of chronic obstructive pulmonary disease.  FUNCTIONAL STATUS: ECOG performance status: (1) Restricted in physically strenuous activity, ambulatory and able to do work of light nature Ambulatory status: Ambulatory within the community with limits  CAREY 1 AND 3 YEAR INDEX Male (2pts) 75-79 or 80-84 (2pts) >84 (3pts) Dependence in toileting (1pt) Partial or full dependence in dressing (1pt) History of malignant neoplasm (2pts) CHF (3pts) COPD (1pts) CKD (3pts)  0-3 pts 6% 1 year mortality ; 21% 3 year mortality 4-5 pts 12% 1 year mortality ; 36% 3 year mortality >5 pts 21% 1 year mortality; 54% 3 year mortality   Past Medical History:  Diagnosis Date   Coughing 02/2015   GERD (gastroesophageal reflux disease)    HIV (human immunodeficiency virus infection) (HCC)     Past Surgical History:  Procedure Laterality Date   CORONARY STENT INTERVENTION N/A 06/24/2019   Procedure: CORONARY STENT INTERVENTION;  Surgeon: Marykay Lex, MD;  Location: [facility] INVASIVE CV LAB;  Service: Cardiovascular;  Laterality: N/A;   CORONARY/GRAFT ACUTE MI REVASCULARIZATION N/A 06/24/2019   Procedure: Coronary/Graft Acute MI Revascularization;  Surgeon: Marykay Lex, MD;  [facility] INVAS[facility] CV LAB;  Service: Cardiovascular;  Laterality: N/A;   LEFT HEART CATH AND CORONARY ANGIOGRAPHY N/A 06/24/2019   Procedure: LEFT HEART CATH AND CORONARY ANGIOGRAPHY;  Surgeon: Marykay Lex, MD;  Location: [facility] INVASIVE CV LAB;  Service: Cardiovascular;  Laterality: N/A;    Family History  Problem Relation Age of Onset   Dementia Mother  Diabetes Father     Social History   Socioeconomic History   Marital status: Legally Separated    Spouse name: Not on file   Number of children: Not on file   Years of education: Not on file   Highest education level: Not on file   Occupational History   Not on file  Tobacco Use   Smoking status: Every Day    Current packs/day: 0.50    Average packs/day: 0.5 packs/day for 30.0 years (15.0 ttl pk-yrs)    Types: Cigarettes   Smokeless tobacco: Never  Vaping Use   Vaping status: Never Used  Substance and Sexual Activity   Alcohol use: Yes    Alcohol/week: 1.0 standard drink of alcohol    Types: 1 Cans of beer per week   Drug use: No   Sexual activity: Yes    Comment: declined condoms  Other Topics Concern   Not on file  Social History Narrative   Not on file   Social Determinants of Health   Financial Resource Strain: Not on file  Food Insecurity: Not on file  Transportation Needs: Not on file  Physical Activity: Not on file  Stress: Not on file  Social Connections: Not on file  Intimate Partner Violence: Not on file    No Known Allergies  Current Outpatient Medications  Medication Sig Dispense Refill   acetaminophen (TYLENOL) 500 MG tablet Take 500 mg by mouth every 6 (six) hours as needed for mild pain or moderate pain.     bictegravir-emtricitabine-tenofovir AF (BIKTARVY) 50-200-25 MG TABS tablet Take 1 tablet by mouth daily. 30 tablet 11   metoprolol tartrate (LOPRESSOR) 25 MG tablet Take 1 tablet (25 mg total) by mouth 2 (two) times daily. 180 tablet 2   nitroGLYCERIN (NITROSTAT) 0.4 MG SL tablet Place 1 tablet (0.4 mg total) under the tongue every 5 (five) minutes as needed for chest pain. 25 tablet 4   rosuvastatin (CRESTOR) 10 MG tablet Take 1 tablet (10 mg total) by mouth daily. 90 tablet 3   No current facility-administered medications for this visit.    PHYSICAL EXAM Vitals:   09/29/23 1008  BP: (!) 142/97  Pulse: 65  Temp: 98.2 F (36.8 C)  TempSrc: Temporal  SpO2: 99%  Weight: 163 lb 6.4 oz (74.1 kg)  Height: 5\' 8"  (1.727 m)    Chronically ill appearing. No distress Regular rate and rhythm Unlabored breathing No pedal pulses  PERTINENT LABORATORY AND RADIOLOGIC  DATA  Most recent CBC    Latest Ref Rng & Units 08/25/2023    9:55 AM 05/27/2023    3:22 PM 09/11/2022   10:58 AM  CBC  WBC 3.8 - 10.8 Thousand/uL 7.1  7.3  7.3   Hemoglobin 13.2 - 17.1 g/dL 16.1  09.6  04.5   Hematocrit 38.5 - 50.0 % 48.9  46.3  48.6   Platelets 140 - 400 Thousand/uL 160  201  211      Most recent CMP    Latest Ref Rng & Units 08/25/2023    9:55 AM 05/27/2023    5:07 PM 05/27/2023    3:22 PM  CMP  Glucose 65 - 99 mg/dL 82   409   BUN 7 - 25 mg/dL 8   9   Creatinine 8.11 - 1.30 mg/dL 9.14   7.82   Sodium 956 - 146 mmol/L 137   135   Potassium 3.5 - 5.3 mmol/L 3.8   3.9   Chloride 98 - 110 mmol/L  101   104   CO2 20 - 32 mmol/L 21   18   Calcium 8.6 - 10.3 mg/dL 86.5   9.3   Total Protein 6.1 - 8.1 g/dL 7.5  6.7    Total Bilirubin 0.2 - 1.2 mg/dL 0.5  0.8    Alkaline Phos 38 - 126 U/L  62    AST 10 - 35 U/L 13  19    ALT 9 - 46 U/L 8  15      Renal function CrCl cannot be calculated (Patient's most recent lab result is older than the maximum 21 days allowed.).  Hgb A1c MFr Bld (%)  Date Value  06/24/2019 5.5    LDL Cholesterol (Calc)  Date Value Ref Range Status  09/11/2022 119 (H) mg/dL (calc) Final    Comment:    Reference range: <100 . Desirable range <100 mg/dL for primary prevention;   <70 mg/dL for patients with CHD or diabetic patients  with > or = 2 CHD risk factors. Marland Kitchen LDL-C is now calculated using the Martin-Hopkins  calculation, which is a validated novel method providing  better accuracy than the Friedewald equation in the  estimation of LDL-C.  Horald Pollen et al. Lenox Ahr. 7846;962(95): 2061-2068  (http://education.[facility].com/faq/FAQ164)      +-------+-----------+-----------+------------+------------+  ABI/TBIToday's ABIToday's TBIPrevious ABIPrevious TBI  +-------+-----------+-----------+------------+------------+  Right 0.66       0.42                                  +-------+-----------+-----------+------------+------------+  Left  0.89       0.54                                 +-------+-----------+-----------+------------+------------+       Rande Brunt. Lenell Antu, MD FACS [facility] Phone Number: [phone] 09/28/2023 8:12 AM   Total time spent on preparing this encounter including chart review, data review, collecting history, examining the patient, coordinating care for this new patient, 60 minutes.  Portions of this report may have been transcribed using voice recognition software.  Every effort has been made to ensure accuracy; however, inadvertent computerized transcription errors may still be present.

## 2023-09-29 ENCOUNTER — Ambulatory Visit (INDEPENDENT_AMBULATORY_CARE_PROVIDER_SITE_OTHER): Payer: Managed Care, Other (non HMO) | Admitting: Vascular Surgery

## 2023-09-29 ENCOUNTER — Encounter: Payer: Self-pay | Admitting: Vascular Surgery

## 2023-09-29 ENCOUNTER — Ambulatory Visit (HOSPITAL_COMMUNITY)
Admission: RE | Admit: 2023-09-29 | Discharge: 2023-09-29 | Disposition: A | Discharge status: Home / Self Care | Payer: Managed Care, Other (non HMO) | Source: Ambulatory Visit | Attending: Vascular Surgery | Admitting: Vascular Surgery

## 2023-09-29 VITALS — BP 142/97 | HR 65 | Temp 98.2°F | Ht 68.0 in | Wt 163.4 lb

## 2023-09-29 DIAGNOSIS — I739 Peripheral vascular disease, unspecified: Secondary | ICD-10-CM | POA: Insufficient documentation

## 2023-09-29 DIAGNOSIS — I70211 Atherosclerosis of native arteries of extremities with intermittent claudication, right leg: Secondary | ICD-10-CM

## 2023-09-30 LAB — VAS US ABI WITH/WO TBI
Left ABI: 0.89
Right ABI: 0.66

## 2023-10-23 ENCOUNTER — Other Ambulatory Visit: Payer: Self-pay

## 2023-10-23 DIAGNOSIS — I739 Peripheral vascular disease, unspecified: Secondary | ICD-10-CM

## 2023-11-03 ENCOUNTER — Ambulatory Visit: Payer: Managed Care, Other (non HMO) | Attending: Cardiovascular Disease

## 2023-11-03 DIAGNOSIS — E785 Hyperlipidemia, unspecified: Secondary | ICD-10-CM

## 2023-11-03 DIAGNOSIS — I25110 Atherosclerotic heart disease of native coronary artery with unstable angina pectoris: Secondary | ICD-10-CM

## 2023-11-03 DIAGNOSIS — Z79899 Other long term (current) drug therapy: Secondary | ICD-10-CM

## 2023-11-04 ENCOUNTER — Telehealth: Payer: Self-pay

## 2023-11-04 DIAGNOSIS — Z79899 Other long term (current) drug therapy: Secondary | ICD-10-CM

## 2023-11-04 DIAGNOSIS — E785 Hyperlipidemia, unspecified: Secondary | ICD-10-CM

## 2023-11-04 LAB — LIPID PANEL
Chol/HDL Ratio: 4 ratio (ref 0.0–5.0)
Cholesterol, Total: 139 mg/dL (ref 100–199)
HDL: 35 mg/dL — ABNORMAL LOW (ref >39)
LDL Chol Calc (NIH): 82 mg/dL (ref 0–99)
Triglycerides: 120 mg/dL (ref 0–149)
VLDL Cholesterol Cal: 22 mg/dL (ref 5–40)

## 2023-11-04 LAB — ALT: ALT: 8 [IU]/L (ref 0–44)

## 2023-11-04 MED ORDER — EZETIMIBE 10 MG PO TABS
10.0000 mg | ORAL_TABLET | Freq: Every day | ORAL | 3 refills | Status: DC
Start: 2023-11-04 — End: 2024-04-12

## 2023-11-04 NOTE — Telephone Encounter (Signed)
Called and spoke with patient who agrees to plan. Zetia sent to pharmacy on file. Labs entered and released and pt states he will come in Feb 2025 to have drawn.

## 2023-11-04 NOTE — Telephone Encounter (Signed)
-----   Message from Kristeen Miss sent at 11/04/2023  9:36 AM EST ----- Hx of CAD / CABG LDL is 82, his LDL goal is < 70 Continue rosuvastatin Add Zetia 10 mg a day Check lipids , ALT in 3 months

## 2023-12-29 ENCOUNTER — Ambulatory Visit: Payer: Managed Care, Other (non HMO) | Admitting: Vascular Surgery

## 2023-12-29 ENCOUNTER — Encounter (HOSPITAL_COMMUNITY): Payer: Managed Care, Other (non HMO)

## 2024-02-15 NOTE — Progress Notes (Deleted)
 [facility] OF [place]  ASSESSMENT / PLAN: Gregory Grant is a 58 y.o. 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  Patient counseled patients with asymptomatic peripheral arterial disease or claudication have a 1-2% risk of developing chronic limb threatening ischemia, but a 15-30% risk of mortality in the next 5 years. Intervention should only be considered for medically optimized patients with disabling symptoms.   Recommend:  Abstinence from all tobacco products. Blood glucose control with goal A1c < 7%. Blood pressure control with goal blood pressure < 140/90 mmHg. Lipid reduction therapy with goal LDL-C <100 mg/dL  Aspirin 81mg  PO QD.  Atorvastatin 40-80mg  PO QD (or other "high intensity" statin therapy). Daily walking to and past the point of discomfort. Patient counseled to keep a log of exercise distance.  Patient counseled he should not undergo intervention unless he has quit smoking. Will see again in 2-3 months after trial of medical therapy.  CHIEF COMPLAINT: right leg pain with walking  HISTORY OF PRESENT ILLNESS: Gregory Grant is a 58 y.o. male who presents to clinic for evaluation of pain in the right leg with walking.  The patient gives a fairly classic history of claudication.  Reports cramping discomfort begins in his calf at about a block.  The pain continues until he rests.  The pain is limiting his ability to do the things he likes to do, but does not limit him from doing necessary things like grocery shopping.  He does not describe rest pain.  He has no ulcers about his feet.  He has well-controlled HIV disease and currently has an undetectable viral load.  VASCULAR SURGICAL HISTORY: none  VASCULAR RISK FACTORS: Negative history of stroke / transient ischemic attack. Positive history of coronary artery disease. + history of PCI.  Negative history of diabetes mellitus.  Positive  history of smoking. + actively smoking. Negative history of hypertension.  Negative history of chronic kidney disease.   Negative history of chronic obstructive pulmonary disease.  FUNCTIONAL STATUS: ECOG performance status: (1) Restricted in physically strenuous activity, ambulatory and able to do work of light nature Ambulatory status: Ambulatory within the community with limits  CAREY 1 AND 3 YEAR INDEX Male (2pts) 75-79 or 80-84 (2pts) >84 (3pts) Dependence in toileting (1pt) Partial or full dependence in dressing (1pt) History of malignant neoplasm (2pts) CHF (3pts) COPD (1pts) CKD (3pts)  0-3 pts 6% 1 year mortality ; 21% 3 year mortality 4-5 pts 12% 1 year mortality ; 36% 3 year mortality >5 pts 21% 1 year mortality; 54% 3 year mortality   Past Medical History:  Diagnosis Date   Coughing 02/2015   GERD (gastroesophageal reflux disease)    HIV (human immunodeficiency virus infection) (HCC)    Peripheral arterial disease (HCC)     Past Surgical History:  Procedure Laterality Date   CORONARY STENT INTERVENTION N/A 06/24/2019   Procedure: CORONARY STENT INTERVENTION;  Surgeon: Marykay Lex, MD;  Location: [facility] INVASIVE CV LAB;  Service: Cardiovascular;  Laterality: N/A;   CORONARY/GRAFT ACUTE MI REVASCULARIZATION N/A 06/24/2019   Procedure: Coronary/Graft Acute MI Revascularization;  Surgeon: Marykay Lex, MD;  Location: [facility] INVASIVE CV LAB;  Service: Cardiovascular;  Laterality: N/A;   LEFT HEART CATH AND CORONARY ANGIOGRAPHY N/A 06/24/2019   Procedure: LEFT HEART CATH AND CORONARY ANGIOGRAPHY;  Surgeon: Marykay Lex, MD;  Location: [facility] INVASIVE CV LAB;  Service: Cardiovascular;  Laterality: N/A;    Family History  Problem Relation Age  of Onset   Dementia Mother    Diabetes Father     Social History   Socioeconomic History   Marital status: Legally Separated    Spouse name: Not on file   Number of children: Not on file   Years of education: Not on file    Highest education level: Not on file  Occupational History   Not on file  Tobacco Use   Smoking status: Every Day    Current packs/day: 0.50    Average packs/day: 0.5 packs/day for 30.0 years (15.0 ttl pk-yrs)    Types: Cigarettes   Smokeless tobacco: Never  Vaping Use   Vaping status: Never Used  Substance and Sexual Activity   Alcohol use: Yes    Alcohol/week: 1.0 standard drink of alcohol    Types: 1 Cans of beer per week   Drug use: No   Sexual activity: Yes    Comment: declined condoms  Other Topics Concern   Not on file  Social History Narrative   Not on file   Social Drivers of Health   Financial Resource Strain: Not on file  Food Insecurity: Not on file  Transportation Needs: Not on file  Physical Activity: Not on file  Stress: Not on file  Social Connections: Not on file  Intimate Partner Violence: Not on file    Not on File  Current Outpatient Medications  Medication Sig Dispense Refill   acetaminophen (TYLENOL) 500 MG tablet Take 500 mg by mouth every 6 (six) hours as needed for mild pain or moderate pain.     bictegravir-emtricitabine-tenofovir AF (BIKTARVY) 50-200-25 MG TABS tablet Take 1 tablet by mouth daily. 30 tablet 11   ezetimibe (ZETIA) 10 MG tablet Take 1 tablet (10 mg total) by mouth daily. 90 tablet 3   metoprolol tartrate (LOPRESSOR) 25 MG tablet Take 1 tablet (25 mg total) by mouth 2 (two) times daily. 180 tablet 2   nitroGLYCERIN (NITROSTAT) 0.4 MG SL tablet Place 1 tablet (0.4 mg total) under the tongue every 5 (five) minutes as needed for chest pain. 25 tablet 4   rosuvastatin (CRESTOR) 10 MG tablet Take 1 tablet (10 mg total) by mouth daily. 90 tablet 3   No current facility-administered medications for this visit.    PHYSICAL EXAM There were no vitals filed for this visit.   Chronically ill appearing. No distress Regular rate and rhythm Unlabored breathing No pedal pulses  PERTINENT LABORATORY AND RADIOLOGIC DATA  Most  recent CBC    Latest Ref Rng & Units 08/25/2023    9:55 AM 05/27/2023    3:22 PM 09/11/2022   10:58 AM  CBC  WBC 3.8 - 10.8 Thousand/uL 7.1  7.3  7.3   Hemoglobin 13.2 - 17.1 g/dL 78.2  95.6  21.3   Hematocrit 38.5 - 50.0 % 48.9  46.3  48.6   Platelets 140 - 400 Thousand/uL 160  201  211      Most recent CMP    Latest Ref Rng & Units 11/03/2023    9:29 AM 08/25/2023    9:55 AM 05/27/2023    5:07 PM  CMP  Glucose 65 - 99 mg/dL  82    BUN 7 - 25 mg/dL  8    Creatinine 0.86 - 1.30 mg/dL  5.78    Sodium 469 - 629 mmol/L  137    Potassium 3.5 - 5.3 mmol/L  3.8    Chloride 98 - 110 mmol/L  101    CO2 20 -  32 mmol/L  21    Calcium 8.6 - 10.3 mg/dL  16.1    Total Protein 6.1 - 8.1 g/dL  7.5  6.7   Total Bilirubin 0.2 - 1.2 mg/dL  0.5  0.8   Alkaline Phos 38 - 126 U/L   62   AST 10 - 35 U/L  13  19   ALT 0 - 44 IU/L 8  8  15      Renal function CrCl cannot be calculated (Patient's most recent lab result is older than the maximum 21 days allowed.).  Hgb A1c MFr Bld (%)  Date Value  06/24/2019 5.5    LDL Cholesterol (Calc)  Date Value Ref Range Status  09/11/2022 119 (H) mg/dL (calc) Final    Comment:    Reference range: <100 . Desirable range <100 mg/dL for primary prevention;   <70 mg/dL for patients with CHD or diabetic patients  with > or = 2 CHD risk factors. Marland Kitchen LDL-C is now calculated using the Martin-Hopkins  calculation, which is a validated novel method providing  better accuracy than the Friedewald equation in the  estimation of LDL-C.  Horald Pollen et al. Lenox Ahr. 0960;454(09): 2061-2068  (http://education.[facility].com/faq/FAQ164)    LDL Chol Calc (NIH)  Date Value Ref Range Status  11/03/2023 82 0 - 99 mg/dL Final     +-------+-----------+-----------+------------+------------+  ABI/TBIToday's ABIToday's TBIPrevious ABIPrevious TBI  +-------+-----------+-----------+------------+------------+  Right 0.66       0.42                                  +-------+-----------+-----------+------------+------------+  Left  0.89       0.54                                 +-------+-----------+-----------+------------+------------+       Rande Brunt. Lenell Antu, MD FACS [facility] Phone Number: [phone] 02/15/2024 10:38 AM   Total time spent on preparing this encounter including chart review, data review, collecting history, examining the patient, coordinating care for this new patient, 60 minutes.  Portions of this report may have been transcribed using voice recognition software.  Every effort has been made to ensure accuracy; however, inadvertent computerized transcription errors may still be present.

## 2024-02-16 ENCOUNTER — Ambulatory Visit (HOSPITAL_COMMUNITY): Payer: Commercial Managed Care - HMO

## 2024-02-16 ENCOUNTER — Other Ambulatory Visit (HOSPITAL_COMMUNITY): Payer: Self-pay

## 2024-02-16 ENCOUNTER — Ambulatory Visit: Payer: Managed Care, Other (non HMO) | Admitting: Vascular Surgery

## 2024-02-19 ENCOUNTER — Other Ambulatory Visit: Payer: Self-pay

## 2024-02-19 ENCOUNTER — Other Ambulatory Visit: Payer: Self-pay | Admitting: Pharmacist

## 2024-02-19 ENCOUNTER — Encounter: Payer: Self-pay | Admitting: Internal Medicine

## 2024-02-19 ENCOUNTER — Ambulatory Visit (INDEPENDENT_AMBULATORY_CARE_PROVIDER_SITE_OTHER): Payer: Commercial Managed Care - HMO | Admitting: Internal Medicine

## 2024-02-19 VITALS — BP 153/114 | HR 92 | Temp 97.8°F | Ht 68.0 in | Wt 174.0 lb

## 2024-02-19 DIAGNOSIS — I739 Peripheral vascular disease, unspecified: Secondary | ICD-10-CM

## 2024-02-19 DIAGNOSIS — B20 Human immunodeficiency virus [HIV] disease: Secondary | ICD-10-CM

## 2024-02-19 DIAGNOSIS — Z716 Tobacco abuse counseling: Secondary | ICD-10-CM

## 2024-02-19 MED ORDER — BICTEGRAVIR-EMTRICITAB-TENOFOV 50-200-25 MG PO TABS: 1.0000 | ORAL_TABLET | Freq: Every day | ORAL | Status: AC

## 2024-02-19 MED ORDER — BIKTARVY 50-200-25 MG PO TABS
1.0000 | ORAL_TABLET | Freq: Every day | ORAL | 11 refills | Status: AC
Start: 2024-02-19 — End: ?

## 2024-02-19 NOTE — Progress Notes (Signed)
 Medication Samples have been provided to the patient.  Drug name: Biktarvy        Strength: 50/200/25 mg       Qty: 28 tablets (4 bottles) LOT: CTDKHA   Exp.Date: 6/27  Dosing instructions: Take one tablet by mouth once daily  The patient has been instructed regarding the correct time, dose, and frequency of taking this medication, including desired effects and most common side effects.   Margarite Gouge, PharmD, CPP, BCIDP, AAHIVP Clinical Pharmacist Practitioner Infectious Diseases Clinical Pharmacist [facility] for Infectious Disease

## 2024-02-19 NOTE — Progress Notes (Signed)
   Subjective:    Patient ID: Gregory Grant, male    DOB: January 05, 1966, 58 y.o.   MRN: [id number]  HPI Gregory Grant is here for follow-up of HIV. He on Biktarvy though has had issues with his and insurance coverage and was out of medication for 3 days.  He otherwise is back on it now and getting samples to continue.  Is having no complaints today.  He is attempting to reduce his smoking and is smoking occasional black and milds now.  He otherwise has no complaints today.   Review of Systems  Constitutional:  Negative for fatigue.  Gastrointestinal:  Negative for diarrhea.  Skin:  Negative for rash.       Objective:   Physical Exam Eyes:     General: No scleral icterus. Pulmonary:     Effort: Pulmonary effort is normal.  Neurological:     Mental Status: He is alert.   SH: + tobacco        Assessment & Plan:

## 2024-02-19 NOTE — Assessment & Plan Note (Signed)
 Continues to have lower extremity pain with activity.  I encouraged smoking cessation and follow-up with primary physician.

## 2024-02-19 NOTE — Assessment & Plan Note (Signed)
 He is doing well and will check his labs today.  Unfortunately due to insurance issues he was out for several days though back on now and he was provided samples.  Otherwise he can follow-up in 6 months.

## 2024-02-19 NOTE — Assessment & Plan Note (Signed)
 Discussed importance of cessation and encouraged his continued efforts at quitting smoking.

## 2024-02-19 NOTE — Progress Notes (Signed)
 Medication Samples have been provided to the patient.  Drug name: Biktarvy        Strength: 50/200/25 mg       Qty: 7 tablets (1 bottles) LOT: CTDKHA   Exp.Date: 6/27  Dosing instructions: Take one tablet by mouth once daily  The patient has been instructed regarding the correct time, dose, and frequency of taking this medication, including desired effects and most common side effects.   Margarite Gouge, PharmD, CPP, BCIDP, AAHIVP Clinical Pharmacist Practitioner Infectious Diseases Clinical Pharmacist [facility] for Infectious Disease

## 2024-02-22 LAB — HIV-1 RNA QUANT-NO REFLEX-BLD
HIV 1 RNA Quant: NOT DETECTED {copies}/mL
HIV-1 RNA Quant, Log: NOT DETECTED {Log_copies}/mL

## 2024-02-22 LAB — T-HELPER CELLS (CD4) COUNT (NOT AT ARMC)
Absolute CD4: 1622 {cells}/uL (ref 490–1740)
CD4 T Helper %: 52 % (ref 30–61)
Total lymphocyte count: 3112 {cells}/uL (ref 850–3900)

## 2024-03-21 ENCOUNTER — Other Ambulatory Visit (HOSPITAL_COMMUNITY): Payer: Self-pay

## 2024-03-23 ENCOUNTER — Other Ambulatory Visit: Payer: Self-pay | Admitting: Pharmacist

## 2024-03-23 DIAGNOSIS — B20 Human immunodeficiency virus [HIV] disease: Secondary | ICD-10-CM

## 2024-03-23 MED ORDER — BIKTARVY 50-200-25 MG PO TABS
1.0000 | ORAL_TABLET | Freq: Every day | ORAL | Status: AC
Start: 2024-03-21 — End: 2024-03-28

## 2024-04-12 ENCOUNTER — Other Ambulatory Visit (HOSPITAL_COMMUNITY): Payer: Self-pay

## 2024-04-12 ENCOUNTER — Encounter (HOSPITAL_COMMUNITY)
Admission: EM | Disposition: A | Discharge status: Home / Self Care | Payer: Self-pay | Source: Home / Self Care | Attending: Emergency Medicine

## 2024-04-12 ENCOUNTER — Other Ambulatory Visit: Payer: Self-pay

## 2024-04-12 ENCOUNTER — Emergency Department (HOSPITAL_COMMUNITY)

## 2024-04-12 ENCOUNTER — Observation Stay (HOSPITAL_COMMUNITY)
Admission: EM | Admit: 2024-04-12 | Discharge: 2024-04-13 | Disposition: A | Discharge status: Home / Self Care | Attending: Cardiology | Admitting: Cardiology

## 2024-04-12 ENCOUNTER — Encounter (HOSPITAL_COMMUNITY): Payer: Self-pay

## 2024-04-12 ENCOUNTER — Telehealth (HOSPITAL_COMMUNITY): Payer: Self-pay | Admitting: Pharmacy Technician

## 2024-04-12 DIAGNOSIS — F17210 Nicotine dependence, cigarettes, uncomplicated: Secondary | ICD-10-CM | POA: Diagnosis not present

## 2024-04-12 DIAGNOSIS — I2510 Atherosclerotic heart disease of native coronary artery without angina pectoris: Secondary | ICD-10-CM

## 2024-04-12 DIAGNOSIS — I10 Essential (primary) hypertension: Secondary | ICD-10-CM

## 2024-04-12 DIAGNOSIS — I213 ST elevation (STEMI) myocardial infarction of unspecified site: Principal | ICD-10-CM | POA: Insufficient documentation

## 2024-04-12 DIAGNOSIS — Z79899 Other long term (current) drug therapy: Secondary | ICD-10-CM | POA: Diagnosis not present

## 2024-04-12 DIAGNOSIS — I2121 ST elevation (STEMI) myocardial infarction involving left circumflex coronary artery: Secondary | ICD-10-CM | POA: Diagnosis not present

## 2024-04-12 DIAGNOSIS — B20 Human immunodeficiency virus [HIV] disease: Secondary | ICD-10-CM | POA: Diagnosis not present

## 2024-04-12 DIAGNOSIS — E782 Mixed hyperlipidemia: Secondary | ICD-10-CM | POA: Diagnosis not present

## 2024-04-12 DIAGNOSIS — E785 Hyperlipidemia, unspecified: Secondary | ICD-10-CM

## 2024-04-12 DIAGNOSIS — R079 Chest pain, unspecified: Secondary | ICD-10-CM | POA: Diagnosis present

## 2024-04-12 DIAGNOSIS — I2119 ST elevation (STEMI) myocardial infarction involving other coronary artery of inferior wall: Secondary | ICD-10-CM

## 2024-04-12 DIAGNOSIS — Z955 Presence of coronary angioplasty implant and graft: Secondary | ICD-10-CM | POA: Diagnosis not present

## 2024-04-12 HISTORY — PX: CORONARY/GRAFT ACUTE MI REVASCULARIZATION: CATH118305

## 2024-04-12 HISTORY — PX: LEFT HEART CATH AND CORONARY ANGIOGRAPHY: CATH118249

## 2024-04-12 LAB — POCT I-STAT, CHEM 8
BUN: 7 mg/dL (ref 6–20)
Calcium, Ion: 1.2 mmol/L (ref 1.15–1.40)
Chloride: 106 mmol/L (ref 98–111)
Creatinine, Ser: 1 mg/dL (ref 0.61–1.24)
Glucose, Bld: 116 mg/dL — ABNORMAL HIGH (ref 70–99)
HCT: 45 % (ref 39.0–52.0)
Hemoglobin: 15.3 g/dL (ref 13.0–17.0)
Potassium: 3.6 mmol/L (ref 3.5–5.1)
Sodium: 140 mmol/L (ref 135–145)
TCO2: 21 mmol/L — ABNORMAL LOW (ref 22–32)

## 2024-04-12 LAB — POCT ACTIVATED CLOTTING TIME
Activated Clotting Time: 308 s
Activated Clotting Time: >1000 s

## 2024-04-12 LAB — PROTIME-INR
INR: 1 (ref 0.8–1.2)
Prothrombin Time: 13.3 s (ref 11.4–15.2)

## 2024-04-12 LAB — COMPREHENSIVE METABOLIC PANEL WITH GFR
ALT: 12 U/L (ref 0–44)
AST: 21 U/L (ref 15–41)
Albumin: 3.5 g/dL (ref 3.5–5.0)
Alkaline Phosphatase: 67 U/L (ref 38–126)
Anion gap: 12 (ref 5–15)
BUN: 9 mg/dL (ref 6–20)
CO2: 18 mmol/L — ABNORMAL LOW (ref 22–32)
Calcium: 9.2 mg/dL (ref 8.9–10.3)
Chloride: 107 mmol/L (ref 98–111)
Creatinine, Ser: 1.25 mg/dL — ABNORMAL HIGH (ref 0.61–1.24)
GFR, Estimated: >60 mL/min (ref >60)
Glucose, Bld: 120 mg/dL — ABNORMAL HIGH (ref 70–99)
Potassium: 3.8 mmol/L (ref 3.5–5.1)
Sodium: 137 mmol/L (ref 135–145)
Total Bilirubin: 0.6 mg/dL (ref 0.0–1.2)
Total Protein: 6.4 g/dL — ABNORMAL LOW (ref 6.5–8.1)

## 2024-04-12 LAB — I-STAT CG4 LACTIC ACID, ED: Lactic Acid, Venous: 1.8 mmol/L (ref 0.5–1.9)

## 2024-04-12 LAB — LIPID PANEL
Cholesterol: 173 mg/dL (ref 0–200)
HDL: 40 mg/dL — ABNORMAL LOW (ref >40)
LDL Cholesterol: 118 mg/dL — ABNORMAL HIGH (ref 0–99)
Total CHOL/HDL Ratio: 4.3 ratio
Triglycerides: 76 mg/dL (ref <150)
VLDL: 15 mg/dL (ref 0–40)

## 2024-04-12 LAB — CBC WITH DIFFERENTIAL/PLATELET
Abs Immature Granulocytes: 0.03 10*3/uL (ref 0.00–0.07)
Basophils Absolute: 0 10*3/uL (ref 0.0–0.1)
Basophils Relative: 1 %
Eosinophils Absolute: 0.4 10*3/uL (ref 0.0–0.5)
Eosinophils Relative: 5 %
HCT: 45.9 % (ref 39.0–52.0)
Hemoglobin: 15.8 g/dL (ref 13.0–17.0)
Immature Granulocytes: 0 %
Lymphocytes Relative: 42 %
Lymphs Abs: 3.5 10*3/uL (ref 0.7–4.0)
MCH: 34.9 pg — ABNORMAL HIGH (ref 26.0–34.0)
MCHC: 34.4 g/dL (ref 30.0–36.0)
MCV: 101.3 fL — ABNORMAL HIGH (ref 80.0–100.0)
Monocytes Absolute: 0.7 10*3/uL (ref 0.1–1.0)
Monocytes Relative: 9 %
Neutro Abs: 3.6 10*3/uL (ref 1.7–7.7)
Neutrophils Relative %: 43 %
Platelets: 129 10*3/uL — ABNORMAL LOW (ref 150–400)
RBC: 4.53 MIL/uL (ref 4.22–5.81)
RDW: 14.9 % (ref 11.5–15.5)
WBC: 8.3 10*3/uL (ref 4.0–10.5)
nRBC: 0 % (ref 0.0–0.2)

## 2024-04-12 LAB — MAGNESIUM: Magnesium: 2 mg/dL (ref 1.7–2.4)

## 2024-04-12 LAB — TROPONIN I (HIGH SENSITIVITY)
Troponin I (High Sensitivity): 55 ng/L — ABNORMAL HIGH (ref <18)
Troponin I (High Sensitivity): >24000 ng/L (ref <18)

## 2024-04-12 LAB — APTT: aPTT: 30 s (ref 24–36)

## 2024-04-12 LAB — HEMOGLOBIN A1C
Hgb A1c MFr Bld: 5.4 % (ref 4.8–5.6)
Mean Plasma Glucose: 108.28 mg/dL

## 2024-04-12 LAB — MRSA NEXT GEN BY PCR, NASAL: MRSA by PCR Next Gen: NOT DETECTED

## 2024-04-12 LAB — CG4 I-STAT (LACTIC ACID): Lactic Acid, Venous: 0.5 mmol/L (ref 0.5–1.9)

## 2024-04-12 SURGERY — CORONARY/GRAFT ACUTE MI REVASCULARIZATION
Anesthesia: LOCAL

## 2024-04-12 MED ORDER — ACETAMINOPHEN 325 MG PO TABS: 650.0000 mg | ORAL_TABLET | ORAL | Status: DC | PRN

## 2024-04-12 MED ORDER — MIDAZOLAM HCL 2 MG/2ML IJ SOLN
INTRAMUSCULAR | Status: DC | PRN
  Administered 2024-04-12: 1 mg via INTRAVENOUS

## 2024-04-12 MED ORDER — VERAPAMIL HCL 2.5 MG/ML IV SOLN
INTRAVENOUS | Status: DC | PRN
  Administered 2024-04-12: 10 mL via INTRA_ARTERIAL

## 2024-04-12 MED ORDER — LIDOCAINE HCL (PF) 1 % IJ SOLN
INTRAMUSCULAR | Status: AC
  Filled 2024-04-12: qty 30

## 2024-04-12 MED ORDER — PRASUGREL HCL 10 MG PO TABS
ORAL_TABLET | ORAL | Status: DC | PRN
  Administered 2024-04-12: 60 mg via ORAL

## 2024-04-12 MED ORDER — BICTEGRAVIR-EMTRICITAB-TENOFOV 50-200-25 MG PO TABS
1.0000 | ORAL_TABLET | Freq: Every day | ORAL | Status: DC
  Administered 2024-04-12 – 2024-04-13 (×2): 1 via ORAL
  Filled 2024-04-12 (×2): qty 1

## 2024-04-12 MED ORDER — LOSARTAN POTASSIUM 25 MG PO TABS
25.0000 mg | ORAL_TABLET | Freq: Every day | ORAL | Status: DC
  Administered 2024-04-12: 25 mg via ORAL
  Filled 2024-04-12: qty 1

## 2024-04-12 MED ORDER — IOHEXOL 350 MG/ML SOLN
INTRAVENOUS | Status: DC | PRN
  Administered 2024-04-12: 110 mL via INTRA_ARTERIAL

## 2024-04-12 MED ORDER — HEPARIN SODIUM (PORCINE) 5000 UNIT/ML IJ SOLN: 60.0000 [IU]/kg | Freq: Once | INTRAMUSCULAR | Status: DC

## 2024-04-12 MED ORDER — ASPIRIN 81 MG PO CHEW: 324.0000 mg | CHEWABLE_TABLET | Freq: Once | ORAL | Status: DC

## 2024-04-12 MED ORDER — MIDAZOLAM HCL 2 MG/2ML IJ SOLN
INTRAMUSCULAR | Status: AC
  Filled 2024-04-12: qty 2

## 2024-04-12 MED ORDER — LIDOCAINE HCL (PF) 1 % IJ SOLN
INTRAMUSCULAR | Status: DC | PRN
  Administered 2024-04-12: 2 mL

## 2024-04-12 MED ORDER — SODIUM CHLORIDE 0.9 % IV SOLN: INTRAVENOUS | Status: AC

## 2024-04-12 MED ORDER — HYDROMORPHONE HCL 1 MG/ML IJ SOLN
1.0000 mg | Freq: Once | INTRAMUSCULAR | Status: AC
  Administered 2024-04-12: 1 mg via INTRAVENOUS
  Filled 2024-04-12: qty 1

## 2024-04-12 MED ORDER — HYDRALAZINE HCL 20 MG/ML IJ SOLN: 10.0000 mg | INTRAMUSCULAR | Status: AC | PRN

## 2024-04-12 MED ORDER — SODIUM CHLORIDE 0.9 % IV SOLN: 250.0000 mL | INTRAVENOUS | Status: DC | PRN

## 2024-04-12 MED ORDER — SODIUM CHLORIDE 0.9% FLUSH: 3.0000 mL | INTRAVENOUS | Status: DC | PRN

## 2024-04-12 MED ORDER — PRASUGREL HCL 10 MG PO TABS
ORAL_TABLET | ORAL | Status: AC
  Filled 2024-04-12: qty 6

## 2024-04-12 MED ORDER — PRASUGREL HCL 10 MG PO TABS
10.0000 mg | ORAL_TABLET | Freq: Every day | ORAL | Status: DC
  Administered 2024-04-13: 10 mg via ORAL
  Filled 2024-04-12: qty 1

## 2024-04-12 MED ORDER — FENTANYL CITRATE (PF) 100 MCG/2ML IJ SOLN
INTRAMUSCULAR | Status: AC
  Filled 2024-04-12: qty 2

## 2024-04-12 MED ORDER — LABETALOL HCL 5 MG/ML IV SOLN: 10.0000 mg | INTRAVENOUS | Status: AC | PRN

## 2024-04-12 MED ORDER — NITROGLYCERIN 0.4 MG SL SUBL
0.4000 mg | SUBLINGUAL_TABLET | SUBLINGUAL | Status: DC | PRN
  Administered 2024-04-12: 0.4 mg via SUBLINGUAL

## 2024-04-12 MED ORDER — HEPARIN SODIUM (PORCINE) 1000 UNIT/ML IJ SOLN
INTRAMUSCULAR | Status: AC
  Filled 2024-04-12: qty 10

## 2024-04-12 MED ORDER — CHLORHEXIDINE GLUCONATE CLOTH 2 % EX PADS
6.0000 | MEDICATED_PAD | Freq: Every day | CUTANEOUS | Status: DC
  Administered 2024-04-12 – 2024-04-13 (×2): 6 via TOPICAL

## 2024-04-12 MED ORDER — ONDANSETRON HCL 4 MG/2ML IJ SOLN: 4.0000 mg | Freq: Four times a day (QID) | INTRAMUSCULAR | Status: DC | PRN

## 2024-04-12 MED ORDER — FENTANYL CITRATE (PF) 100 MCG/2ML IJ SOLN
INTRAMUSCULAR | Status: DC | PRN
  Administered 2024-04-12: 25 ug via INTRAVENOUS

## 2024-04-12 MED ORDER — HEPARIN SODIUM (PORCINE) 1000 UNIT/ML IJ SOLN
INTRAMUSCULAR | Status: DC | PRN
  Administered 2024-04-12: 8000 [IU] via INTRAVENOUS

## 2024-04-12 MED ORDER — HEPARIN SODIUM (PORCINE) 5000 UNIT/ML IJ SOLN
4000.0000 [IU] | Freq: Once | INTRAMUSCULAR | Status: AC
  Administered 2024-04-12: 4000 [IU] via INTRAVENOUS

## 2024-04-12 MED ORDER — POTASSIUM CHLORIDE CRYS ER 20 MEQ PO TBCR
40.0000 meq | EXTENDED_RELEASE_TABLET | Freq: Once | ORAL | Status: AC
  Administered 2024-04-12: 40 meq via ORAL
  Filled 2024-04-12: qty 2

## 2024-04-12 MED ORDER — ORAL CARE MOUTH RINSE: 15.0000 mL | OROMUCOSAL | Status: DC | PRN

## 2024-04-12 MED ORDER — VERAPAMIL HCL 2.5 MG/ML IV SOLN
INTRAVENOUS | Status: AC
  Filled 2024-04-12: qty 2

## 2024-04-12 MED ORDER — SODIUM CHLORIDE 0.9% FLUSH
3.0000 mL | Freq: Two times a day (BID) | INTRAVENOUS | Status: DC
  Administered 2024-04-12 – 2024-04-13 (×3): 3 mL via INTRAVENOUS

## 2024-04-12 MED ORDER — ASPIRIN 81 MG PO TBEC
81.0000 mg | DELAYED_RELEASE_TABLET | Freq: Every day | ORAL | Status: DC
  Administered 2024-04-13: 81 mg via ORAL
  Filled 2024-04-12: qty 1

## 2024-04-12 MED ORDER — ROSUVASTATIN CALCIUM 20 MG PO TABS
40.0000 mg | ORAL_TABLET | Freq: Every day | ORAL | Status: DC
  Administered 2024-04-12 – 2024-04-13 (×2): 40 mg via ORAL
  Filled 2024-04-12 (×3): qty 2

## 2024-04-12 SURGICAL SUPPLY — 17 items
BALLOON EMERGE MR 2.0X12 (BALLOONS) IMPLANT
BALLOON SAPPHIRE NC24 2.50X10 (BALLOONS) IMPLANT
BALLOON ~~LOC~~ EMERGE MR 2.25X15 (BALLOONS) IMPLANT
CATH INFINITI AMBI 5FR TG (CATHETERS) IMPLANT
CATH LAUNCHER 6FR EBU3.5 (CATHETERS) IMPLANT
DEVICE RAD COMP TR BAND LRG (VASCULAR PRODUCTS) IMPLANT
GLIDESHEATH SLEND SS 6F .021 (SHEATH) IMPLANT
GUIDEWIRE INQWIRE 1.5J.035X260 (WIRE) IMPLANT
KIT ENCORE 26 ADVANTAGE (KITS) IMPLANT
KIT HEMO VALVE WATCHDOG (MISCELLANEOUS) IMPLANT
KIT SINGLE USE MANIFOLD (KITS) IMPLANT
PACK CARDIAC CATHETERIZATION (CUSTOM PROCEDURE TRAY) ×2 IMPLANT
SET ATX-X65L (MISCELLANEOUS) IMPLANT
STATION PROTECTION PRESSURIZED (MISCELLANEOUS) IMPLANT
STENT SYNERGY XD 2.25X28 (Permanent Stent) IMPLANT
TUBING CIL FLEX 10 FLL-RA (TUBING) IMPLANT
WIRE ASAHI PROWATER 180CM (WIRE) IMPLANT

## 2024-04-12 NOTE — ED Provider Notes (Signed)
 [facility]-EMERGENCY DEPT [facility] Emergency Department Provider Note MRN:  [id number]  Arrival date & time: 04/12/24     Chief Complaint   Code STEMI   History of Present Illness   Gregory Grant is a 58 y.o. year-old male with a history of CAD, HIV presenting to the ED with chief complaint of code STEMI.  Chest pain waking him from sleep at 2 AM, pressure-like pain in the chest.  Was found to be quite diaphoretic with EMS.  Code STEMI called prior to arrival.  Review of Systems  A thorough review of systems was obtained and all systems are negative except as noted in the HPI and PMH.   Patient's Health History    Past Medical History:  Diagnosis Date   Coughing 02/2015   GERD (gastroesophageal reflux disease)    HIV (human immunodeficiency virus infection) (HCC)    Peripheral arterial disease (HCC)     Past Surgical History:  Procedure Laterality Date   CORONARY STENT INTERVENTION N/A 06/24/2019   Procedure: CORONARY STENT INTERVENTION;  Surgeon: Arleen Lacer, MD;  Location: [facility] INVASIVE CV LAB;  Service: Cardiovascular;  Laterality: N/A;   CORONARY/GRAFT ACUTE MI REVASCULARIZATION N/A 06/24/2019   Procedure: Coronary/Graft Acute MI Revascularization;  Surgeon: Arleen Lacer, MD;  Location: [facility] INVASIVE CV LAB;  Service: Cardiovascular;  Laterality: N/A;   LEFT HEART CATH AND CORONARY ANGIOGRAPHY N/A 06/24/2019   Procedure: LEFT HEART CATH AND CORONARY ANGIOGRAPHY;  Surgeon: Arleen Lacer, MD;  Location: [facility] INVASIVE CV LAB;  Service: Cardiovascular;  Laterality: N/A;    Family History  Problem Relation Age of Onset   Dementia Mother    Diabetes Father     Social History   Socioeconomic History   Marital status: Legally Separated    Spouse name: Not on file   Number of children: Not on file   Years of education: Not on file   Highest education level: Not on file  Occupational History   Not on file  Tobacco Use   Smoking status: Every Day    Current  packs/day: 0.50    Average packs/day: 0.5 packs/day for 30.0 years (15.0 ttl pk-yrs)    Types: Cigarettes   Smokeless tobacco: Never  Vaping Use   Vaping status: Never Used  Substance and Sexual Activity   Alcohol use: Yes    Alcohol/week: 1.0 standard drink of alcohol    Types: 1 Cans of beer per week   Drug use: No   Sexual activity: Yes    Comment: declined condoms  Other Topics Concern   Not on file  Social History Narrative   Not on file   Social Drivers of Health   Financial Resource Strain: Not on file  Food Insecurity: Not on file  Transportation Needs: Not on file  Physical Activity: Not on file  Stress: Not on file  Social Connections: Not on file  Intimate Partner Violence: Not on file     Physical Exam   Vitals:   04/12/24 0537  BP: (!) 151/107  Pulse: 79  Resp: (!) 23  Temp: 98 F (36.7 C)  SpO2: 100%    CONSTITUTIONAL: Ill-appearing, NAD NEURO/PSYCH:  Alert and oriented x 3, no focal deficits EYES:  eyes equal and reactive ENT/NECK:  no LAD, no JVD CARDIO: Regular rate, well-perfused, normal S1 and S2 PULM:  CTAB no wheezing or rhonchi GI/GU:  non-distended, non-tender MSK/SPINE:  No gross deformities, no edema SKIN:  no rash, atraumatic   *Additional and/or  pertinent findings included in MDM below  Diagnostic and Interventional Summary    EKG Interpretation Date/Time:  Tuesday April 12 2024 05:36:33 EDT Ventricular Rate:  69 PR Interval:  145 QRS Duration:  88 QT Interval:  404 QTC Calculation: 433 R Axis:   -1  Text Interpretation: Sinus rhythm Abnormal R-wave progression, early transition Inferior infarct, acute (LCx) >>> Acute MI <<< Confirmed by Gwenetta Lennert [phone]) on 04/12/2024 5:57:41 AM       Labs Reviewed  CBC WITH DIFFERENTIAL/PLATELET - Abnormal; Notable for the following components:      Result Value   MCV 101.3 (*)    MCH 34.9 (*)    Platelets 129 (*)    All other components within normal limits  PROTIME-INR   APTT  HEMOGLOBIN A1C  COMPREHENSIVE METABOLIC PANEL WITH GFR  LIPID PANEL  I-STAT CG4 LACTIC ACID, ED  TROPONIN I (HIGH SENSITIVITY)    DG Chest Port 1 View    (Results Pending)    Medications  0.9 %  sodium chloride  infusion (has no administration in time range)  nitroGLYCERIN  (NITROSTAT ) SL tablet 0.4 mg (0.4 mg Sublingual Given 04/12/24 0540)  HYDROmorphone  (DILAUDID ) injection 1 mg (1 mg Intravenous Given 04/12/24 0541)  heparin  injection 4,000 Units (4,000 Units Intravenous Given 04/12/24 0538)     Procedures  /  Critical Care .Critical Care  Performed by: Edson Graces, MD Authorized by: Edson Graces, MD   Critical care provider statement:    Critical care time (minutes):  35   Critical care was necessary to treat or prevent imminent or life-threatening deterioration of the following conditions: STEMI.   Critical care was time spent personally by me on the following activities:  Development of treatment plan with patient or surrogate, discussions with consultants, evaluation of patient's response to treatment, examination of patient, ordering and review of laboratory studies, ordering and review of radiographic studies, ordering and performing treatments and interventions, pulse oximetry, re-evaluation of patient's condition and review of old charts   ED Course and Medical Decision Making  Initial Impression and Ddx Code STEMI with continued chest pain, history of CAD, has had catheterization in the past but this was 3 years ago.  Subtle blood present ST elevation inferiorly with lateral reciprocal depression.  Past medical/surgical history that increases complexity of ED encounter: CAD  Interpretation of Diagnostics I personally reviewed the EKG and my interpretation is as follows: STEMI  Labs pending  Patient Reassessment and Ultimate Disposition/Management     Taken to Cath Lab by cardiology.  Patient management required discussion with the following services  or consulting groups:  Cardiology  Complexity of Problems Addressed Acute illness or injury that poses threat of life of bodily function  Additional Data Reviewed and Analyzed Further history obtained from: EMS on arrival  Additional Factors Impacting ED Encounter Risk Consideration of hospitalization  Merrick Abe. Harless Lien, MD [facility] Emergency Medicine [facility] [email or web address]  Final Clinical Impressions(s) / ED Diagnoses     ICD-10-CM   1. ST elevation myocardial infarction (STEMI), unspecified artery (HCC)  I21.3       ED Discharge Orders     None        Discharge Instructions Discussed with and Provided to Patient:   Discharge Instructions   None      Edson Graces, MD 04/12/24 [phone]

## 2024-04-12 NOTE — ED Triage Notes (Signed)
 BIB EMS/ 8/10 crushing CP/ N/V, pt is diaphoretic, SHOB, 158/110, 98%, 90HR, 324mg  ASA, 4mg  zofran , 4mg  morphine , 1-nitroglycerin / pt took 2 nitroglycerin  @ home with no relief/ hx of heart cath

## 2024-04-12 NOTE — H&P (Addendum)
 Cardiology Admission History and Physical   Patient ID: Gregory Grant MRN: [id number]; DOB: May 28, 1966   Admission date: 04/12/2024  PCP:  Pcp, No   [facility] Providers Cardiologist:  None        Chief Complaint:  inferior STEMI  Patient Profile:   Gregory Grant is a 58 y.o. male with CAD and HIV who is being seen 04/12/2024 for the evaluation of chest pain.  History of Present Illness:   Gregory Grant started having symptoms overnight, still 8/10 on presentation despite 2 nitros in EMS.  Diaphoretic, nauseous.  No shortness of breath or syncope.  ECG w/ inferior 1-78mm ST elevation CXR clear on prelim read  H/o HIV Prior inferior STEMI in 2020 s/p DESx2 to prox and mid RCA. Had residual disease in the L PDA. Recalls being on DAPT at that time, doesn't remember why stopped.  Currently on NO cardiac meds, no aspirin  either. No recent GI bleeding Still uses tobacco Medicaid insured which helps w/ medication copays  SBP 150s on presentation, HR in the 70s. R radial pulse 2+ Last cath was also thru R radial.  Got heparin  4000 units and full dose aspirin  before coming to the lab.   Past Medical History:  Diagnosis Date   Coughing 02/2015   GERD (gastroesophageal reflux disease)    HIV (human immunodeficiency virus infection) (HCC)    Peripheral arterial disease (HCC)     Past Surgical History:  Procedure Laterality Date   CORONARY STENT INTERVENTION N/A 06/24/2019   Procedure: CORONARY STENT INTERVENTION;  Surgeon: Arleen Lacer, MD;  Location: [facility] INVASIVE CV LAB;  Service: Cardiovascular;  Laterality: N/A;   CORONARY/GRAFT ACUTE MI REVASCULARIZATION N/A 06/24/2019   Procedure: Coronary/Graft Acute MI Revascularization;  Surgeon: Arleen Lacer, MD;  Location: [facility] INVASIVE CV LAB;  Service: Cardiovascular;  Laterality: N/A;   LEFT HEART CATH AND CORONARY ANGIOGRAPHY N/A 06/24/2019   Procedure: LEFT HEART CATH AND CORONARY ANGIOGRAPHY;  Surgeon:  Arleen Lacer, MD;  Location: [facility] INVASIVE CV LAB;  Service: Cardiovascular;  Laterality: N/A;     Medications Prior to Admission: Prior to Admission medications   Medication Sig Start Date End Date Taking? Authorizing Provider  acetaminophen  (TYLENOL ) 500 MG tablet Take 500 mg by mouth every 6 (six) hours as needed for mild pain or moderate pain.    [provider]  bictegravir-emtricitabine -tenofovir  AF (BIKTARVY ) 50-200-25 MG TABS tablet Take 1 tablet by mouth daily. 02/19/24   Lina Render, MD  ezetimibe  (ZETIA ) 10 MG tablet Take 1 tablet (10 mg total) by mouth daily. 11/04/23 02/02/24  Nahser, Lela Purple, MD  metoprolol  tartrate (LOPRESSOR ) 25 MG tablet Take 1 tablet (25 mg total) by mouth 2 (two) times daily. 08/03/23   Nahser, Lela Purple, MD  nitroGLYCERIN  (NITROSTAT ) 0.4 MG SL tablet Place 1 tablet (0.4 mg total) under the tongue every 5 (five) minutes as needed for chest pain. 08/03/23   Nahser, Lela Purple, MD  rosuvastatin  (CRESTOR ) 10 MG tablet Take 1 tablet (10 mg total) by mouth daily. 08/03/23   Nahser, Lela Purple, MD  famotidine  (PEPCID ) 20 MG tablet Take 1 tablet (20 mg total) by mouth 2 (two) times daily. To protect stomach 10/11/20 12/25/20  Fulp, Cammie, MD  losartan  (COZAAR ) 50 MG tablet Take 1 tablet (50 mg total) by mouth daily. To lower blood pressure 10/11/20 12/25/20  Fulp, Margy Shin, MD     Allergies:   No Known Allergies  Social History:   Social History  Socioeconomic History   Marital status: Legally Separated    Spouse name: Not on file   Number of children: Not on file   Years of education: Not on file   Highest education level: Not on file  Occupational History   Not on file  Tobacco Use   Smoking status: Every Day    Current packs/day: 0.50    Average packs/day: 0.5 packs/day for 30.0 years (15.0 ttl pk-yrs)    Types: Cigarettes   Smokeless tobacco: Never  Vaping Use   Vaping status: Never Used  Substance and Sexual Activity   Alcohol use: Yes     Alcohol/week: 1.0 standard drink of alcohol    Types: 1 Cans of beer per week   Drug use: No   Sexual activity: Yes    Comment: declined condoms  Other Topics Concern   Not on file  Social History Narrative   Not on file   Social Drivers of Health   Financial Resource Strain: Not on file  Food Insecurity: Not on file  Transportation Needs: Not on file  Physical Activity: Not on file  Stress: Not on file  Social Connections: Not on file  Intimate Partner Violence: Not on file    Family History:   The patient's family history includes Dementia in his mother; Diabetes in his father.    ROS:  Please see the history of present illness.  All other ROS reviewed and negative.     Physical Exam/Data:   Vitals:   04/12/24 0537 04/12/24 0538 04/12/24 0601  BP: (!) 151/107    Pulse: 79    Resp: (!) 23    Temp: 98 F (36.7 C)    TempSrc: Oral    SpO2: 100%  94%  Weight:  80 kg    No intake or output data in the 24 hours ending 04/12/24 0609    04/12/2024    5:38 AM 02/19/2024    9:34 AM 09/29/2023   10:08 AM  Last 3 Weights  Weight (lbs) 176 lb 5.9 oz 174 lb 163 lb 6.4 oz  Weight (kg) 80 kg 78.926 kg 74.118 kg     Body mass index is 26.82 kg/m.  General:  Well nourished, well developed, in some acute distress HEENT: normal Neck: mild JVD Vascular: No carotid bruits; Distal pulses 2+ bilaterally   Cardiac:  normal S1, S2; RRR; no murmur  Lungs:  clear to auscultation bilaterally, no wheezing, rhonchi or rales  Abd: soft, nontender, no hepatomegaly  Ext: no edema Musculoskeletal:  No deformities, BUE and BLE strength normal and equal Skin: warm and dry  Neuro:  CNs 2-12 intact, no focal abnormalities noted Psych:  Normal affect   Laboratory Data:  High Sensitivity Troponin:  No results for input(s): "TROPONINIHS" in the last 720 hours.    ChemistryNo results for input(s): "NA", "K", "CL", "CO2", "GLUCOSE", "BUN", "CREATININE", "CALCIUM ", "MG", "GFRNONAA", "GFRAA",  "ANIONGAP" in the last 168 hours.  No results for input(s): "PROT", "ALBUMIN", "AST", "ALT", "ALKPHOS", "BILITOT" in the last 168 hours. Lipids  Recent Labs  Lab 04/12/24 0533  CHOL 173  TRIG 76  HDL 40*  LDLCALC 118*  CHOLHDL 4.3   Hematology Recent Labs  Lab 04/12/24 0533  WBC 8.3  RBC 4.53  HGB 15.8  HCT 45.9  MCV 101.3*  MCH 34.9*  MCHC 34.4  RDW 14.9  PLT 129*   Thyroid No results for input(s): "TSH", "FREET4" in the last 168 hours. BNPNo results for input(s): "BNP", "PROBNP"  in the last 168 hours.  DDimer No results for input(s): "DDIMER" in the last 168 hours.   Radiology/Studies:  No results found.   Assessment and Plan:  Inferior STEMI, delivered to the lab for urgent reperfusion.  Risk Assessment/Risk Scores:    TIMI Risk Score for ST  Elevation MI:   The patient's TIMI risk score is 2, which indicates a 2.2% risk of all cause mortality at 30 days.       Code Status: Full Code  Severity of Illness: The appropriate patient status for this patient is INPATIENT. Inpatient status is judged to be reasonable and necessary in order to provide the required intensity of service to ensure the patient's safety. The patient's presenting symptoms, physical exam findings, and initial radiographic and laboratory data in the context of their chronic comorbidities is felt to place them at high risk for further clinical deterioration. Furthermore, it is not anticipated that the patient will be medically stable for discharge from the hospital within 2 midnights of admission.   * I certify that at the point of admission it is my clinical judgment that the patient will require inpatient hospital care spanning beyond 2 midnights from the point of admission due to high intensity of service, high risk for further deterioration and high frequency of surveillance required.*   For questions or updates, please contact [facility] Please consult [email or web address] for contact  info under     Signed, Philmore Bream, MD  04/12/2024 6:09 AM   ------------------------------------------------------------------------------  After conducting a review of all available clinical information with the care team, interviewing the patient, and performing a physical exam, I agree with the findings and plan described in this note.    58 year old male with HIV on treatment, CAD (primary PCI to RCA for inferior STEMI in 2020), now presenting with chest pain and inferior STEMI.   Ongoing chest pain 6/10, will proceed with urgent coronary angiography and intervention. He has remote h/o GI bleeding with no ongoing issues. He has not been on any cardiac meds recently, will need hypertension and hyperlipidemia management post cath. Used to smoke 2 PPD cigarettes, now down to 4 cigars a day, working on quitting.   Fransico Ivy, MD

## 2024-04-12 NOTE — TOC Initial Note (Signed)
 Transition of Care [facility]) - Initial/Assessment Note    Patient Details  Name: Gregory Grant MRN: [id number] Date of Birth: December 30, 1965  Transition of Care [facility]) CM/SW Contact:    Benjiman Bras, RN Phone Number: [phone] 04/12/2024, 1:34 PM  Clinical Narrative:                  TOC CM spoke to pt and gave permission to speak to girlfriend, [place]. Pt independent pta to admission. Use public transportation to appts. Pt has a cane that he use for ambulation.   [facility] to arrange hospital follow up appt. Will check availability and get back to CM.  Expected Discharge Plan: Home/Self Care Barriers to Discharge: Continued Medical Work up   Patient Goals and CMS Choice            Expected Discharge Plan and Services   Discharge Planning Services: CM Consult   Living arrangements for the past 2 months: Apartment                                      Prior Living Arrangements/Services Living arrangements for the past 2 months: Apartment Lives with:: Significant Other Patient language and need for interpreter reviewed:: Yes Do you feel safe going back to the place where you live?: Yes      Need for Family Participation in Patient Care: No (Comment) Care giver support system in place?: Yes (comment) Current home services: DME (cane) Criminal Activity/Legal Involvement Pertinent to Current Situation/Hospitalization: No - Comment as needed  Activities of Daily Living      Permission Sought/Granted Permission sought to share information with : Case Manager, Family Supports, PCP Permission granted to share information with : Yes, Verbal Permission Granted  Share Information with NAME: Johnetta     Permission granted to share info w Relationship: girlfriend     Emotional Assessment Appearance:: Appears stated age Attitude/Demeanor/Rapport: Engaged Affect (typically observed): Accepting Orientation: : Oriented to Self, Oriented to  Place, Oriented to  Time, Oriented to Situation   Psych Involvement: No (comment)  Admission diagnosis:  ST elevation myocardial infarction (STEMI), unspecified artery (HCC) [I21.3] STEMI involving left circumflex coronary artery (HCC) [I21.21] Patient Active Problem List   Diagnosis Date Noted   STEMI involving left circumflex coronary artery (HCC) 04/12/2024   Primary hypertension 04/12/2024   Claudication of both lower extremities (HCC) 09/04/2023   Acute ST elevation myocardial infarction (STEMI) of inferolateral wall (HCC) 06/24/2019    Class: Hospitalized for   Coronary artery disease involving native coronary artery of native heart with unstable angina pectoris (HCC) 06/24/2019   Acute ST elevation myocardial infarction (STEMI) involving right coronary artery (HCC) 06/24/2019   Medication monitoring encounter 06/21/2018   Alcohol use 03/30/2018   Right hip pain 03/30/2018   Mixed hyperlipidemia 03/30/2018   Screening examination for venereal disease 12/22/2017   Healthcare maintenance 12/22/2017   Human immunodeficiency virus (HIV) disease (HCC) 04/13/2017   Tobacco abuse counseling 04/13/2017   Prediabetes 03/16/2015   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/14/2015   PCP:  Pcp, No Pharmacy:   [id number] Jonette Nestle, [place] - [address] AT [facility] [address] [place] [address] Phone: [phone] Fax: [phone]  [facility] [address], [address] [place] [address] Phone: [phone] Fax: [phone]  Social Drivers of Health (SDOH) Social History: SDOH Screenings   Depression (PHQ2-9): Low Risk  (02/19/2024)  Tobacco Use: High Risk (04/12/2024)   SDOH Interventions:     Readmission Risk Interventions     No data to display

## 2024-04-12 NOTE — Telephone Encounter (Signed)
 Patient Product/process development scientist completed.    The patient is insured through [facility].     Ran test claim for prasugrel 10 mg and the current 30 day co-pay is $4.00.   This test claim was processed through [facility]- copay amounts may vary at other pharmacies due to pharmacy/plan contracts, or as the patient moves through the different stages of their insurance plan.     Morgan Arab, CPHT Pharmacy Technician III Certified Patient Advocate [facility] Patient Advocate Team Direct Number: [phone]  Fax: [phone]

## 2024-04-13 ENCOUNTER — Encounter (HOSPITAL_COMMUNITY): Payer: Self-pay | Admitting: Cardiology

## 2024-04-13 ENCOUNTER — Other Ambulatory Visit (HOSPITAL_COMMUNITY): Payer: Self-pay

## 2024-04-13 ENCOUNTER — Other Ambulatory Visit (HOSPITAL_COMMUNITY)

## 2024-04-13 ENCOUNTER — Telehealth: Payer: Self-pay | Admitting: Cardiology

## 2024-04-13 DIAGNOSIS — I2510 Atherosclerotic heart disease of native coronary artery without angina pectoris: Secondary | ICD-10-CM | POA: Diagnosis not present

## 2024-04-13 DIAGNOSIS — I10 Essential (primary) hypertension: Secondary | ICD-10-CM | POA: Diagnosis not present

## 2024-04-13 DIAGNOSIS — B20 Human immunodeficiency virus [HIV] disease: Secondary | ICD-10-CM | POA: Diagnosis not present

## 2024-04-13 DIAGNOSIS — E782 Mixed hyperlipidemia: Secondary | ICD-10-CM | POA: Diagnosis not present

## 2024-04-13 DIAGNOSIS — I2121 ST elevation (STEMI) myocardial infarction involving left circumflex coronary artery: Secondary | ICD-10-CM | POA: Diagnosis not present

## 2024-04-13 DIAGNOSIS — I213 ST elevation (STEMI) myocardial infarction of unspecified site: Secondary | ICD-10-CM | POA: Diagnosis not present

## 2024-04-13 LAB — CBC
HCT: 44.7 % (ref 39.0–52.0)
Hemoglobin: 15.7 g/dL (ref 13.0–17.0)
MCH: 34.6 pg — ABNORMAL HIGH (ref 26.0–34.0)
MCHC: 35.1 g/dL (ref 30.0–36.0)
MCV: 98.5 fL (ref 80.0–100.0)
Platelets: 127 10*3/uL — ABNORMAL LOW (ref 150–400)
RBC: 4.54 MIL/uL (ref 4.22–5.81)
RDW: 14.7 % (ref 11.5–15.5)
WBC: 6.8 10*3/uL (ref 4.0–10.5)
nRBC: 0 % (ref 0.0–0.2)

## 2024-04-13 MED ORDER — LOSARTAN POTASSIUM 50 MG PO TABS
50.0000 mg | ORAL_TABLET | Freq: Every day | ORAL | 3 refills | Status: DC
  Filled 2024-04-13: qty 30, 30d supply, fill #0

## 2024-04-13 MED ORDER — LOSARTAN POTASSIUM 50 MG PO TABS
50.0000 mg | ORAL_TABLET | Freq: Every day | ORAL | Status: DC
Start: 2024-04-13 — End: 2024-04-13
  Administered 2024-04-13: 50 mg via ORAL
  Filled 2024-04-13: qty 1

## 2024-04-13 MED ORDER — ASPIRIN 81 MG PO TBEC
81.0000 mg | DELAYED_RELEASE_TABLET | Freq: Every day | ORAL | 12 refills | Status: DC
  Filled 2024-04-13: qty 30, 30d supply, fill #0

## 2024-04-13 MED ORDER — ACETAMINOPHEN 325 MG PO TABS: 650.0000 mg | ORAL_TABLET | ORAL | Status: DC | PRN

## 2024-04-13 MED ORDER — PRASUGREL HCL 10 MG PO TABS
10.0000 mg | ORAL_TABLET | Freq: Every day | ORAL | 3 refills | Status: DC
  Filled 2024-04-13: qty 30, 30d supply, fill #0

## 2024-04-13 MED ORDER — ROSUVASTATIN CALCIUM 40 MG PO TABS
40.0000 mg | ORAL_TABLET | Freq: Every day | ORAL | 3 refills | Status: DC
  Filled 2024-04-13: qty 30, 30d supply, fill #0

## 2024-04-13 MED FILL — Heparin Sodium (Porcine) Inj 1000 Unit/ML: INTRAMUSCULAR | Qty: 10 | Status: AC

## 2024-04-13 NOTE — Discharge Summary (Addendum)
 Discharge Summary    Patient ID: Gregory Grant MRN: [id number]; DOB: 05-30-1966  Admit date: 04/12/2024 Discharge date: 04/13/2024  PCP:  Pcp, No   [facility] Providers Cardiologist:  None        Discharge Diagnoses    Principal Problem:   STEMI involving left circumflex coronary artery [facility]) Active Problems:   Mixed hyperlipidemia   Primary hypertension  Diagnostic Studies/Procedures    Coronary angiography and intervention 04/12/2024: LM: No significant disease LAD: Mild diffuse disease Lcx: Mid 60% stenosis (nonculprit), followed by tandem 80 and 99% stenosis in mid to distal vessel (culprit) RCA: Patent proximal and mid stents with no significant restenosis.  Distal RCA focal 40% stenosis.   LVEDP 21 mmHg   Successful percutaneous coronary intervention mid to distal LCx        PTCA and stent placement 2.25 X 28 mm Synergy drug-eluting stent        Post dilatation using 2.25 and 2.5 mm Matthews balloons up tp 18 atm    _____________   History of Present Illness     Gregory Grant started having symptoms overnight, still 8/10 on presentation despite 2 nitros in EMS.  Diaphoretic, nauseous.  No shortness of breath or syncope.   ECG w/ inferior 1-14mm ST elevation CXR clear on prelim read   H/o HIV Prior inferior STEMI in 2020 s/p DESx2 to prox and mid RCA. Had residual disease in the L PDA. Recalls being on DAPT at that time, doesn't remember why stopped.   Currently on NO cardiac meds, no aspirin  either. No recent GI bleeding Still uses tobacco Medicaid insured which helps w/ medication copays   SBP 150s on presentation, HR in the 70s. R radial pulse 2+ Last cath was also thru R radial.   Got heparin  4000 units and full dose aspirin  before coming to the lab.  Hospital Course     ST elevation (STEMI) myocardial infarction Status post insertion of drug eluting coronary artery stent in Lcx (Synergy 2.25 x 28 mm; 2.25 and 2.5 mm balloon PTCA)  -  DAPT: 81 mg aspirin  and Effient 10 mg daily x 12 months - Rosuvastatin  40 mg daily - Beta blocker held due to bradycardia into 40s post procedure, he is prescribed Lopressor  25 BID but does not take it - Losartan  50 daily - [facility] 1 and 2 consult  - Pt adamant to leave hospital hospital before post-MI Echo completed, will need Echo in outpatient setting    Hx of STEMI and Coronary artery disease with prior PCI to RCA, 2020 History of above but lost to follow-up after his first MI and presented taking no cardioprotective/blood-thinning medicines  Primary hypertension  Restart Losartan  50 daily. Was prescribed this but not taking prior to hospitalization.   Hyperlipidemia with target low density lipoprotein (LDL) cholesterol less than 55 mg/dL LDL 244 on admission.  Started on rosuvastatin  40 mg daily - Repeat lipid panel OP   HIV Disease Continue Biktarvy  50-200-25 daily   Cigar smoker Multiple cigars daily. Tried and failed patches, gum. Believes he can stop on his own. Smoking cessation counseling      Did the patient have an acute coronary syndrome (MI, NSTEMI, STEMI, etc) this admission?:  Yes                               AHA/ACC ACS Clinical Performance & Quality Measures: Aspirin  prescribed? - Yes ADP Receptor Inhibitor (Plavix/Clopidogrel,  Brilinta /Ticagrelor  or Effient/Prasugrel) prescribed (includes medically managed patients)? - Yes Beta Blocker prescribed? - Yes High Intensity Statin (Lipitor  40-80mg  or Crestor  20-40mg ) prescribed? - Yes EF assessed during THIS hospitalization? - No - Outpatient Echocardiogram will be scheduled to assess EF. For EF <40%, was ACEI/ARB prescribed? - No - Outpatient Echocardiogram to assess EF will be scheduled. For EF <40%, Aldosterone Antagonist (Spironolactone or Eplerenone) prescribed? - No - Outpatient Echocardiogram to assess EF will be scheduled. Cardiac Rehab Phase II ordered (including medically managed patients)? - Yes   The  patient will be scheduled for a TOC follow up appointment in 17 days.  A message has been sent to the [facility] and Scheduling Pool at the office where the patient should be seen for follow up.  _____________  Discharge Vitals Blood pressure (!) 131/105, pulse 98, temperature 98.7 F (37.1 C), temperature source Oral, resp. rate 17, height 5\' 8"  (1.727 m), weight 80 kg, SpO2 94%.  Filed Weights   04/12/24 0538  Weight: 80 kg   Physical Exam Constitutional:      General: He is not in acute distress.    Appearance: He is not ill-appearing.  Cardiovascular:     Rate and Rhythm: Normal rate and regular rhythm.  Pulmonary:     Effort: Pulmonary effort is normal.     Breath sounds: Normal breath sounds.  Abdominal:     General: Bowel sounds are normal.  Musculoskeletal:     Right lower leg: No edema.     Left lower leg: No edema.  Neurological:     General: No focal deficit present.     Mental Status: He is alert. Mental status is at baseline.  Psychiatric:        Mood and Affect: Mood normal.        Behavior: Behavior normal.     Comments: Pt agreeable but insistent that he will leave hospital this am    Labs & Radiologic Studies    CBC Recent Labs    04/12/24 0533 04/12/24 0616 04/13/24 0440  WBC 8.3  --  6.8  NEUTROABS 3.6  --   --   HGB 15.8 15.3 15.7  HCT 45.9 45.0 44.7  MCV 101.3*  --  98.5  PLT 129*  --  127*   Basic Metabolic Panel Recent Labs    36/64/40 0533 04/12/24 0616 04/12/24 0744  NA 137 140  --   K 3.8 3.6  --   CL 107 106  --   CO2 18*  --   --   GLUCOSE 120* 116*  --   BUN 9 7  --   CREATININE 1.25* 1.00  --   CALCIUM  9.2  --   --   MG  --   --  2.0   Liver Function Tests Recent Labs    04/12/24 0533  AST 21  ALT 12  ALKPHOS 67  BILITOT 0.6  PROT 6.4*  ALBUMIN 3.5   No results for input(s): "LIPASE", "AMYLASE" in the last 72 hours. High Sensitivity Troponin:   Recent Labs  Lab 04/12/24 0533 04/12/24 0744  TROPONINIHS 55*  >24,000*    BNP Invalid input(s): "POCBNP" D-Dimer No results for input(s): "DDIMER" in the last 72 hours. Hemoglobin A1C Recent Labs    04/12/24 0533  HGBA1C 5.4   Fasting Lipid Panel Recent Labs    04/12/24 0533  CHOL 173  HDL 40*  LDLCALC 118*  TRIG 76  CHOLHDL 4.3   Thyroid Function Tests  No results for input(s): "TSH", "T4TOTAL", "T3FREE", "THYROIDAB" in the last 72 hours.  Invalid input(s): "FREET3" _____________  CARDIAC CATHETERIZATION Result Date: 04/12/2024 Images from the original result were not included. Coronary angiography and intervention 04/12/2024: LM: No significant disease LAD: Mild diffuse disease Lcx: Mid 60% stenosis (nonculprit), followed by tandem 80 and 99% stenosis in mid to distal vessel (culprit) RCA: Patent proximal and mid stents with no significant restenosis.  Distal RCA focal 40% stenosis. LVEDP 21 mmHg Successful percutaneous coronary intervention mid to distal LCx        PTCA and stent placement 2.25 X 28 mm Synergy drug-eluting stent        Post dilatation using 2.25 and 2.5 mm [place] balloons up tp 18 atm Manish Corliss Dies, MD   DG Chest Port 1 View Result Date: 04/12/2024 CLINICAL DATA:  58 year old male with chest pain, STEMI. EXAM: PORTABLE CHEST 1 VIEW COMPARISON:  Chest radiograph 05/27/2023 and earlier. FINDINGS: Portable AP semi upright view at 0545 hours. Slightly lower lung volumes, mediastinal contours stable and within normal limits allowing for this. Visualized tracheal air column is within normal limits. Allowing for portable technique the lungs are clear. No pneumothorax or pleural effusion. No acute osseous abnormality identified. IMPRESSION: No acute cardiopulmonary abnormality. Electronically Signed   By: Marlise Simpers M.D.   On: 04/12/2024 06:07   Disposition   Pt is being discharged home today in good condition. Pt extremely anxious to leave and unwilling to stay for routing Echo. Will instead complete this study in the outpatient  setting.  Follow-up Plans & Appointments    Discharge Instructions     Amb Referral to Cardiac Rehabilitation   Complete by: As directed    Diagnosis:  STEMI Coronary Stents     After initial evaluation and assessments completed: Virtual Based Care may be provided alone or in conjunction with Phase 2 Cardiac Rehab based on patient barriers.: Yes   Intensive Cardiac Rehabilitation (ICR) [facility] location only OR Traditional Cardiac Rehabilitation (TCR) *If criteria for ICR are not met will enroll in TCR [facility] only): Yes   Call MD for:  difficulty breathing, headache or visual disturbances   Complete by: As directed    Call MD for:  extreme fatigue   Complete by: As directed    Call MD for:  persistant dizziness or light-headedness   Complete by: As directed    Call MD for:  persistant nausea and vomiting   Complete by: As directed    Call MD for:  temperature >100.4   Complete by: As directed    Diet - low sodium heart healthy   Complete by: As directed    Increase activity slowly   Complete by: As directed         Discharge Medications   Allergies as of 04/13/2024   No Known Allergies      Medication List     PAUSE taking these medications    metoprolol  tartrate 25 MG tablet Wait to take this until your doctor or other care provider tells you to start again. Commonly known as: LOPRESSOR  Take 1 tablet (25 mg total) by mouth 2 (two) times daily.       TAKE these medications    acetaminophen  325 MG tablet Commonly known as: TYLENOL  Take 2 tablets (650 mg total) by mouth every 4 (four) hours as needed for headache or mild pain (pain score 1-3).   aspirin  EC 81 MG tablet Take 1 tablet (81 mg total) by mouth  daily. Swallow whole. Start taking on: Apr 14, 2024   Biktarvy  50-200-25 MG Tabs tablet Generic drug: bictegravir-emtricitabine -tenofovir  AF Take 1 tablet by mouth daily.   losartan  50 MG tablet Commonly known as: COZAAR  Take 1 tablet (50 mg total) by mouth  daily. Start taking on: Apr 14, 2024   nitroGLYCERIN  0.4 MG SL tablet Commonly known as: NITROSTAT  Place 1 tablet (0.4 mg total) under the tongue every 5 (five) minutes as needed for chest pain.   prasugrel 10 MG Tabs tablet Commonly known as: EFFIENT Take 1 tablet (10 mg total) by mouth daily. Start taking on: Apr 14, 2024   rosuvastatin  40 MG tablet Commonly known as: CRESTOR  Take 1 tablet (40 mg total) by mouth daily. Start taking on: Apr 14, 2024 What changed:  medication strength how much to take           Outstanding Labs/Studies    Signed, Carleen Chary, DO, IM resident PGY-1 04/13/2024, 12:12 PM  ATTENDING ATTESTATION  I have seen, examined and evaluated the patient this morning on rounds along with The Resident Physician Carleen Chary, DO, IM resident PGY-1).  After reviewing all the available data and chart, we discussed the patients laboratory, study & physical findings as well as symptoms in detail.  I agree with his findings, examination as well as impression recommendations as per our discussion.    Attending adjustments noted in italics.   Feels much better after PCI.  Very anxious to go home frustrated that is not going home yet.  We have titrated meds to this most safety to be can in the survive time we have but I suspect that she did fine.  Try to keep her from leaving AMA, he was discharged for the results of his echocardiogram were done.  This can be discussed in the outpatient setting. . .  Stable for discharge.  Duration of Discharge Encounter:  A total of 45 minutes in the care of [place] today including reviewing labs (1 minute each attending and Resident), reviewing studies (2 minute Resident, 6-minute attending), face to face time discussing treatment options (15 minutes Resident, 10 minutes attending)), 10 minutes Resident, 5 minutes attending, and documenting in the encounter.       Arleen Lacer, MD, MS Randene Bustard, M.D., M.S. Interventional Cardiologist  [facility]  Pager # [phone] Phone # [phone] [address] [place], [place] [address]

## 2024-04-13 NOTE — Plan of Care (Signed)

## 2024-04-13 NOTE — Telephone Encounter (Signed)
   Transition of Care Follow-up Phone Call Request    Patient Name: [name] Date of Birth: 07-01-1966 Date of Encounter: 04/13/2024  Primary Care Provider:  Pcp, No Primary Cardiologist:  None  Gregory Grant has been scheduled for a transition of care follow up appointment with a [facility] provider:  Lawana Pray 5/16  Please reach out to Gregory Grant within 48 hours of discharge to confirm appointment and review transition of care protocol questionnaire. Anticipated discharge date: 4/30  Johnie Nailer, NP  04/13/2024, 9:30 AM

## 2024-04-13 NOTE — Plan of Care (Signed)

## 2024-04-13 NOTE — Discharge Instructions (Signed)
 Expect a call to set up a post-hospital appointment. Be sure to take your aspirin  and Effient - these keep your stent open.

## 2024-04-13 NOTE — Progress Notes (Signed)
 CARDIAC REHAB PHASE I   PRE:  Rate/Rhythm:  62 SR    MODE:  Ambulation: 470 ft   POST:  Rate/Rhythm: 68 SR         Pt ambulated independently in hallway. Tolerated well with no CP, SOB or dizziness. Returned to chair with call bell and bedside table in reach. Post MI/stent education including restrictions, risk factors, exercise guidelines, antiplatelet therapy importance, MI booklet, NTG use, heart healthy diet, smoking cessation and CRP2 reviewed. All questions and concerns addressed. Will refer to [facility] for CRP2. Plan for discharge home later today.   [id number] Ronny Colas, RN BSN 04/13/2024 9:27 AM

## 2024-04-13 NOTE — Telephone Encounter (Signed)
 Attempted to call patient, no answer left message requesting a call back.

## 2024-04-14 LAB — LIPOPROTEIN A (LPA): Lipoprotein (a): 14.8 nmol/L

## 2024-04-14 NOTE — Telephone Encounter (Signed)
 Pt retuning call, requesting cb

## 2024-04-14 NOTE — Telephone Encounter (Signed)
 Patient contacted regarding discharge [place] on 04/13/2024  Patient understands to follow up with provider Oletta Berry 04/29/2024 at 2:45 pm at [facility] Patient understands discharge instructions? Yes  Patient understands medications and regiment? Yes Patient understands to bring all medications to this visit? Yes Ask patient:  Are you enrolled in My Chart yes

## 2024-04-25 NOTE — Progress Notes (Unsigned)
 [facility] [place]  ASSESSMENT / PLAN: Gregory Grant is a 58 y.o. 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  Patient counseled patients with asymptomatic peripheral arterial disease or claudication have a 1-2% risk of developing chronic limb threatening ischemia, but a 15-30% risk of mortality in the next 5 years. Intervention should only be considered for medically optimized patients with disabling symptoms.   Recommend:  Abstinence from all tobacco products. Blood glucose control with goal A1c < 7%. Blood pressure control with goal blood pressure < 140/90 mmHg. Lipid reduction therapy with goal LDL-C <100 mg/dL  Aspirin  81mg  PO QD.  Atorvastatin  40-80mg  PO QD (or other "high intensity" statin therapy). Daily walking to and past the point of discomfort. Patient counseled to keep a log of exercise distance.  Patient counseled he should not undergo intervention unless he has quit smoking. Will see again in 2-3 months after trial of medical therapy.  CHIEF COMPLAINT: right leg pain with walking  HISTORY OF PRESENT ILLNESS: Gregory Grant is a 58 y.o. male who presents to clinic for evaluation of pain in the right leg with walking.  The patient gives a fairly classic history of claudication.  Reports cramping discomfort begins in his calf at about a block.  The pain continues until he rests.  The pain is limiting his ability to do the things he likes to do, but does not limit him from doing necessary things like grocery shopping.  He does not describe rest pain.  He has no ulcers about his feet.  He has well-controlled HIV disease and currently has an undetectable viral load.  VASCULAR SURGICAL HISTORY: none  VASCULAR RISK FACTORS: Negative history of stroke / transient ischemic attack. Positive history of coronary artery disease. + history of PCI.  Negative history of diabetes mellitus.  Positive  history of smoking. + actively smoking. Negative history of hypertension.  Negative history of chronic kidney disease.   Negative history of chronic obstructive pulmonary disease.  FUNCTIONAL STATUS: ECOG performance status: (1) Restricted in physically strenuous activity, ambulatory and able to do work of light nature Ambulatory status: Ambulatory within the community with limits  CAREY 1 AND 3 YEAR INDEX Male (2pts) 75-79 or 80-84 (2pts) >84 (3pts) Dependence in toileting (1pt) Partial or full dependence in dressing (1pt) History of malignant neoplasm (2pts) CHF (3pts) COPD (1pts) CKD (3pts)  0-3 pts 6% 1 year mortality ; 21% 3 year mortality 4-5 pts 12% 1 year mortality ; 36% 3 year mortality >5 pts 21% 1 year mortality; 54% 3 year mortality   Past Medical History:  Diagnosis Date   Coughing 02/2015   GERD (gastroesophageal reflux disease)    HIV (human immunodeficiency virus infection) (HCC)    Peripheral arterial disease (HCC)     Past Surgical History:  Procedure Laterality Date   CORONARY STENT INTERVENTION N/A 06/24/2019   Procedure: CORONARY STENT INTERVENTION;  Surgeon: Arleen Lacer, MD;  [facility] INVASIVE CV LAB;  Service: Cardiovascular;  Laterality: N/A;   CORONARY/GRAFT ACUTE MI REVASCULARIZATION N/A 06/24/2019   Procedure: Coronary/Graft Acute MI Revascularization;  Surgeon: Arleen Lacer, MD;  Location: [facility] INVASIVE CV LAB;  Service: Cardiovascular;  Laterality: N/A;   CORONARY/GRAFT ACUTE MI REVASCULARIZATION N/A 04/12/2024   Procedure: Coronary/Graft Acute MI Revascularization;  Surgeon: Cody Das, MD;  [facility]: [facility] INVAS[facility] CV LAB;  Service: Cardiovascular;  Laterality: N/A;   LEFT HEART CATH AND CORONARY ANGIOGRAPHY N/A 06/24/2019   Procedure:  LEFT HEART CATH AND CORONARY ANGIOGRAPHY;  Surgeon: Arleen Lacer, MD;  Location: [facility] INVASIVE CV LAB;  Service: Cardiovascular;  Laterality: N/A;   LEFT HEART CATH AND CORONARY ANGIOGRAPHY N/A  04/12/2024   Procedure: LEFT HEART CATH AND CORONARY ANGIOGRAPHY;  Surgeon: Cody Das, MD;  [facility] INV[facility] CV LAB;  Service: Cardiovascular;  Laterality: N/A;    Family History  Problem Relation Age of Onset   Dementia Mother    Diabetes Father     Social History   Socioeconomic History   Marital status: Legally Separated    Spouse name: Not on file   Number of children: Not on file   Years of education: Not on file   Highest education level: Not on file  Occupational History   Not on file  Tobacco Use   Smoking status: Every Day    Current packs/day: 0.50    Average packs/day: 0.5 packs/day for 30.0 years (15.0 ttl pk-yrs)    Types: Cigarettes   Smokeless tobacco: Never  Vaping Use   Vaping status: Never Used  Substance and Sexual Activity   Alcohol use: Yes    Alcohol/week: 1.0 standard drink of alcohol    Types: 1 Cans of beer per week   Drug use: No   Sexual activity: Yes    Comment: declined condoms  Other Topics Concern   Not on file  Social History Narrative   Not on file   Social Drivers of Health   Financial Resource Strain: Not on file  Food Insecurity: Not on file  Transportation Needs: Not on file  Physical Activity: Not on file  Stress: Not on file  Social Connections: Not on file  Intimate Partner Violence: Not on file    No Known Allergies  Current Outpatient Medications  Medication Sig Dispense Refill   acetaminophen  (TYLENOL ) 325 MG tablet Take 2 tablets (650 mg total) by mouth every 4 (four) hours as needed for headache or mild pain (pain score 1-3).     aspirin  EC 81 MG tablet Take 1 tablet (81 mg total) by mouth daily. Swallow whole. 30 tablet 12   bictegravir-emtricitabine -tenofovir  AF (BIKTARVY ) 50-200-25 MG TABS tablet Take 1 tablet by mouth daily. 30 tablet 11   losartan  (COZAAR ) 50 MG tablet Take 1 tablet (50 mg total) by mouth daily. 30 tablet 3   [Paused] metoprolol  tartrate (LOPRESSOR ) 25 MG tablet Take 1 tablet  (25 mg total) by mouth 2 (two) times daily. (Patient not taking: Reported on 04/12/2024) 180 tablet 2   nitroGLYCERIN  (NITROSTAT ) 0.4 MG SL tablet Place 1 tablet (0.4 mg total) under the tongue every 5 (five) minutes as needed for chest pain. (Patient not taking: Reported on 04/12/2024) 25 tablet 4   prasugrel  (EFFIENT ) 10 MG TABS tablet Take 1 tablet (10 mg total) by mouth daily. 30 tablet 3   rosuvastatin  (CRESTOR ) 40 MG tablet Take 1 tablet (40 mg total) by mouth daily. 30 tablet 3   No current facility-administered medications for this visit.    PHYSICAL EXAM There were no vitals filed for this visit.   Chronically ill appearing. No distress Regular rate and rhythm Unlabored breathing No pedal pulses  PERTINENT LABORATORY AND RADIOLOGIC DATA  Most recent CBC    Latest Ref Rng & Units 04/13/2024    4:40 AM 04/12/2024    6:16 AM 04/12/2024    5:33 AM  CBC  WBC 4.0 - 10.5 K/uL 6.8   8.3   Hemoglobin 13.0 - 17.0 g/dL  15.7  15.3  15.8   Hematocrit 39.0 - 52.0 % 44.7  45.0  45.9   Platelets 150 - 400 K/uL 127   129      Most recent CMP    Latest Ref Rng & Units 04/12/2024    6:16 AM 04/12/2024    5:33 AM 11/03/2023    9:29 AM  CMP  Glucose 70 - 99 mg/dL 604  540    BUN 6 - 20 mg/dL 7  9    Creatinine 9.81 - 1.24 mg/dL 1.91  4.78    Sodium 295 - 145 mmol/L 140  137    Potassium 3.5 - 5.1 mmol/L 3.6  3.8    Chloride 98 - 111 mmol/L 106  107    CO2 22 - 32 mmol/L  18    Calcium  8.9 - 10.3 mg/dL  9.2    Total Protein 6.5 - 8.1 g/dL  6.4    Total Bilirubin 0.0 - 1.2 mg/dL  0.6    Alkaline Phos 38 - 126 U/L  67    AST 15 - 41 U/L  21    ALT 0 - 44 U/L  12  8     Renal function Estimated Creatinine Clearance: 80.8 mL/min (by C-G formula based on SCr of 1 mg/dL).  Hgb A1c MFr Bld (%)  Date Value  04/12/2024 5.4    LDL Cholesterol (Calc)  Date Value Ref Range Status  09/11/2022 119 (H) mg/dL (calc) Final    Comment:    Reference range: <100 . Desirable range <100  mg/dL for primary prevention;   <70 mg/dL for patients with CHD or diabetic patients  with > or = 2 CHD risk factors. Aaron Aas LDL-C is now calculated using the Martin-Hopkins  calculation, which is a validated novel method providing  better accuracy than the Friedewald equation in the  estimation of LDL-C.  Melinda Sprawls et al. Erroll Heard. 6213;086(57): 2061-2068  (http://education.[employer].com/faq/FAQ164)    LDL Chol Calc (NIH)  Date Value Ref Range Status  11/03/2023 82 0 - 99 mg/dL Final   LDL Cholesterol  Date Value Ref Range Status  04/12/2024 118 (H) 0 - 99 mg/dL Final    Comment:           Total Cholesterol/HDL:CHD Risk Coronary Heart Disease Risk Table                     Men   Women  1/2 Average Risk   3.4   3.3  Average Risk       5.0   4.4  2 X Average Risk   9.6   7.1  3 X Average Risk  23.4   11.0        Use the calculated Patient Ratio above and the CHD Risk Table to determine the patient's CHD Risk.        ATP III CLASSIFICATION (LDL):  <100     mg/dL   Optimal  846-962  mg/dL   Near or Above                    Optimal  130-159  mg/dL   Borderline  952-841  mg/dL   High  >324     mg/dL   Very High Performed at [facility], [address], [place], [place] [address]      +-------+-----------+-----------+------------+------------+  ABI/TBIToday's ABIToday's TBIPrevious ABIPrevious TBI  +-------+-----------+-----------+------------+------------+  Right 0.66       0.42                                 +-------+-----------+-----------+------------+------------+  Left  0.89       0.54                                 +-------+-----------+-----------+------------+------------+       Heber Little. Edgardo Goodwill, MD [facility] Phone Number: [phone] 04/25/2024 4:27 PM   Total time spent on preparing this encounter including chart review, data review, collecting history, examining the patient,  coordinating care for this new patient, 60 minutes.  Portions of this report may have been transcribed using voice recognition software.  Every effort has been made to ensure accuracy; however, inadvertent computerized transcription errors may still be present.

## 2024-04-25 NOTE — H&P (View-Only) (Signed)
 [facility] [place]  ASSESSMENT / PLAN: Gregory Grant is a 58 y.o. male with atherosclerosis of native arteries of left lower extremity causing rest pain  Recommend:  Abstinence from all tobacco products. Blood glucose control with goal A1c < 7%. Blood pressure control with goal blood pressure < 140/90 mmHg. Lipid reduction therapy with goal LDL-C <100 mg/dL  Aspirin  81mg  PO QD.  Atorvastatin  40-80mg  PO QD (or other "high intensity" statin therapy). Daily walking to and past the point of discomfort. Patient counseled to keep a log of exercise distance.  Patient symptoms have progressed to the left.  He is in early rest pain.  He suffered a recent STEMI.  I am hopeful that he will have a endovascular solution given his recent myocardial infarction.  If he needs open surgery, he will need cardiac evaluation prior.  CHIEF COMPLAINT: right leg pain with walking  HISTORY OF PRESENT ILLNESS: Gregory Grant is a 58 y.o. male who presents to clinic for evaluation of pain in the right leg with walking.  The patient gives a fairly classic history of claudication.  Reports cramping discomfort begins in his calf at about a block.  The pain continues until he rests.  The pain is limiting his ability to do the things he likes to do, but does not limit him from doing necessary things like grocery shopping.  He does not describe rest pain.  He has no ulcers about his feet.  He has well-controlled HIV disease and currently has an undetectable viral load.  04/26/2024.  Patient returns to office for follow-up of lower extremity symptoms.  He suffered a STEMI 04/12/2024 and underwent percutaneous intervention with Dr. Addie Holstein the same evening.  Good technical result was achieved with left circumflex stenting.  He has cut back on smoking significantly, but still smokes about 1 cigar a day.  Patient's symptoms in the lower extremity have progressed.  He is barely able to walk.  He reports  pain in the leg that wakes him up from sleep at night.  VASCULAR SURGICAL HISTORY: none  VASCULAR RISK FACTORS: Negative history of stroke / transient ischemic attack. Positive history of coronary artery disease. + history of PCI.  Negative history of diabetes mellitus.  Positive history of smoking. + actively smoking. Negative history of hypertension.  Negative history of chronic kidney disease.   Negative history of chronic obstructive pulmonary disease.  FUNCTIONAL STATUS: ECOG performance status: (1) Restricted in physically strenuous activity, ambulatory and able to do work of light nature Ambulatory status: Ambulatory within the community with limits  CAREY 1 AND 3 YEAR INDEX Male (2pts) 75-79 or 80-84 (2pts) >84 (3pts) Dependence in toileting (1pt) Partial or full dependence in dressing (1pt) History of malignant neoplasm (2pts) CHF (3pts) COPD (1pts) CKD (3pts)  0-3 pts 6% 1 year mortality ; 21% 3 year mortality 4-5 pts 12% 1 year mortality ; 36% 3 year mortality >5 pts 21% 1 year mortality; 54% 3 year mortality   Past Medical History:  Diagnosis Date   Coughing 02/2015   GERD (gastroesophageal reflux disease)    HIV (human immunodeficiency virus infection) (HCC)    Peripheral arterial disease (HCC)     Past Surgical History:  Procedure Laterality Date   CORONARY STENT INTERVENTION N/A 06/24/2019   Procedure: CORONARY STENT INTERVENTION;  Surgeon: Arleen Lacer, MD;  Location: [facility] INVASIVE CV LAB;  Service: Cardiovascular;  Laterality: N/A;   CORONARY/GRAFT ACUTE MI REVASCULARIZATION N/A 06/24/2019   Procedure: Coronary/Graft Acute  MI Revascularization;  Surgeon: Arleen Lacer, MD;  Location: [facility] INVASIVE CV LAB;  Service: Cardiovascular;  Laterality: N/A;   CORONARY/GRAFT ACUTE MI REVASCULARIZATION N/A 04/12/2024   Procedure: Coronary/Graft Acute MI Revascularization;  Surgeon: Cody Das, MD;  Location: [facility] INVASIVE CV LAB;  Service: Cardiovascular;   Laterality: N/A;   LEFT HEART CATH AND CORONARY ANGIOGRAPHY N/A 06/24/2019   Procedure: LEFT HEART CATH AND CORONARY ANGIOGRAPHY;  Surgeon: Arleen Lacer, MD;  Location: [facility] INVASIVE CV LAB;  Service: Cardiovascular;  Laterality: N/A;   LEFT HEART CATH AND CORONARY ANGIOGRAPHY N/A 04/12/2024   Procedure: LEFT HEART CATH AND CORONARY ANGIOGRAPHY;  Surgeon: Cody Das, MD;  Location: [facility] INVASIVE CV LAB;  Service: Cardiovascular;  Laterality: N/A;    Family History  Problem Relation Age of Onset   Dementia Mother    Diabetes Father     Social History   Socioeconomic History   Marital status: Legally Separated    Spouse name: Not on file   Number of children: Not on file   Years of education: Not on file   Highest education level: Not on file  Occupational History   Not on file  Tobacco Use   Smoking status: Every Day    Current packs/day: 0.50    Average packs/day: 0.5 packs/day for 30.0 years (15.0 ttl pk-yrs)    Types: Cigarettes   Smokeless tobacco: Never  Vaping Use   Vaping status: Never Used  Substance and Sexual Activity   Alcohol use: Yes    Alcohol/week: 1.0 standard drink of alcohol    Types: 1 Cans of beer per week   Drug use: No   Sexual activity: Yes    Comment: declined condoms  Other Topics Concern   Not on file  Social History Narrative   Not on file   Social Drivers of Health   Financial Resource Strain: Not on file  Food Insecurity: Not on file  Transportation Needs: Not on file  Physical Activity: Not on file  Stress: Not on file  Social Connections: Not on file  Intimate Partner Violence: Not on file    No Known Allergies  Current Outpatient Medications  Medication Sig Dispense Refill   acetaminophen  (TYLENOL ) 325 MG tablet Take 2 tablets (650 mg total) by mouth every 4 (four) hours as needed for headache or mild pain (pain score 1-3).     aspirin  EC 81 MG tablet Take 1 tablet (81 mg total) by mouth daily. Swallow whole. 30 tablet  12   bictegravir-emtricitabine -tenofovir  AF (BIKTARVY ) 50-200-25 MG TABS tablet Take 1 tablet by mouth daily. 30 tablet 11   losartan  (COZAAR ) 50 MG tablet Take 1 tablet (50 mg total) by mouth daily. 30 tablet 3   prasugrel  (EFFIENT ) 10 MG TABS tablet Take 1 tablet (10 mg total) by mouth daily. 30 tablet 3   rosuvastatin  (CRESTOR ) 40 MG tablet Take 1 tablet (40 mg total) by mouth daily. 30 tablet 3   [Paused] metoprolol  tartrate (LOPRESSOR ) 25 MG tablet Take 1 tablet (25 mg total) by mouth 2 (two) times daily. (Patient not taking: Reported on 04/12/2024) 180 tablet 2   nitroGLYCERIN  (NITROSTAT ) 0.4 MG SL tablet Place 1 tablet (0.4 mg total) under the tongue every 5 (five) minutes as needed for chest pain. (Patient not taking: Reported on 04/12/2024) 25 tablet 4   No current facility-administered medications for this visit.    PHYSICAL EXAM Vitals:   04/26/24 0842  BP: (!) 146/94  Pulse: 77  Temp: 98.1 F (36.7 C)  SpO2: 99%  Weight: 180 lb (81.6 kg)  Height: 5\' 8"  (1.727 m)     Chronically ill appearing. No distress Regular rate and rhythm Unlabored breathing No palpable pedal pulses Palpable right femoral pulse  PERTINENT LABORATORY AND RADIOLOGIC DATA  Most recent CBC    Latest Ref Rng & Units 04/13/2024    4:40 AM 04/12/2024    6:16 AM 04/12/2024    5:33 AM  CBC  WBC 4.0 - 10.5 K/uL 6.8   8.3   Hemoglobin 13.0 - 17.0 g/dL 16.1  09.6  04.5   Hematocrit 39.0 - 52.0 % 44.7  45.0  45.9   Platelets 150 - 400 K/uL 127   129      Most recent CMP    Latest Ref Rng & Units 04/12/2024    6:16 AM 04/12/2024    5:33 AM 11/03/2023    9:29 AM  CMP  Glucose 70 - 99 mg/dL 409  811    BUN 6 - 20 mg/dL 7  9    Creatinine 9.14 - 1.24 mg/dL 7.82  9.56    Sodium 213 - 145 mmol/L 140  137    Potassium 3.5 - 5.1 mmol/L 3.6  3.8    Chloride 98 - 111 mmol/L 106  107    CO2 22 - 32 mmol/L  18    Calcium  8.9 - 10.3 mg/dL  9.2    Total Protein 6.5 - 8.1 g/dL  6.4    Total Bilirubin 0.0  - 1.2 mg/dL  0.6    Alkaline Phos 38 - 126 U/L  67    AST 15 - 41 U/L  21    ALT 0 - 44 U/L  12  8     Renal function Estimated Creatinine Clearance: 80.8 mL/min (by C-G formula based on SCr of 1 mg/dL).  Hgb A1c MFr Bld (%)  Date Value  04/12/2024 5.4    LDL Cholesterol (Calc)  Date Value Ref Range Status  09/11/2022 119 (H) mg/dL (calc) Final    Comment:    Reference range: <100 . Desirable range <100 mg/dL for primary prevention;   <70 mg/dL for patients with CHD or diabetic patients  with > or = 2 CHD risk factors. Aaron Aas LDL-C is now calculated using the Martin-Hopkins  calculation, which is a validated novel method providing  better accuracy than the Friedewald equation in the  estimation of LDL-C.  Melinda Sprawls et al. Erroll Heard. 0865;784(69): 2061-2068  (http://education.[employer].com/faq/FAQ164)    LDL Chol Calc (NIH)  Date Value Ref Range Status  11/03/2023 82 0 - 99 mg/dL Final   LDL Cholesterol  Date Value Ref Range Status  04/12/2024 118 (H) 0 - 99 mg/dL Final    Comment:           Total Cholesterol/HDL:CHD Risk Coronary Heart Disease Risk Table                     Men   Women  1/2 Average Risk   3.4   3.3  Average Risk       5.0   4.4  2 X Average Risk   9.6   7.1  3 X Average Risk  23.4   11.0        Use the calculated Patient Ratio above and the CHD Risk Table to determine the patient's CHD Risk.        ATP III CLASSIFICATION (LDL):  <100  mg/dL   Optimal  409-811  mg/dL   Near or Above                    Optimal  130-159  mg/dL   Borderline  914-782  mg/dL   High  >956     mg/dL   Very High Performed at [facility], [address], [place], [place] [address]      +-------+-----------+-----------+------------+------------+  ABI/TBIToday's ABIToday's TBIPrevious ABIPrevious TBI  +-------+-----------+-----------+------------+------------+  Right 0.70       0.43                                  +-------+-----------+-----------+------------+------------+  Left  0.46       absent                               +-------+-----------+-----------+------------+------------+    Heber Little. Edgardo Goodwill, MD FACS [facility] Phone Number: [phone] 04/26/2024 9:27 AM   Total time spent on preparing this encounter including chart review, data review, collecting history, examining the patient, coordinating care for this established patient, 40 minutes  Portions of this report may have been transcribed using voice recognition software.  Every effort has been made to ensure accuracy; however, inadvertent computerized transcription errors may still be present.

## 2024-04-26 ENCOUNTER — Other Ambulatory Visit: Payer: Self-pay

## 2024-04-26 ENCOUNTER — Ambulatory Visit (HOSPITAL_COMMUNITY)
Admission: RE | Admit: 2024-04-26 | Discharge: 2024-04-26 | Disposition: A | Discharge status: Home / Self Care | Source: Ambulatory Visit | Attending: Vascular Surgery | Admitting: Vascular Surgery

## 2024-04-26 ENCOUNTER — Encounter: Payer: Self-pay | Admitting: Vascular Surgery

## 2024-04-26 ENCOUNTER — Ambulatory Visit: Admitting: Vascular Surgery

## 2024-04-26 VITALS — BP 146/94 | HR 77 | Temp 98.1°F | Ht 68.0 in | Wt 180.0 lb

## 2024-04-26 DIAGNOSIS — I739 Peripheral vascular disease, unspecified: Secondary | ICD-10-CM | POA: Insufficient documentation

## 2024-04-26 DIAGNOSIS — I70222 Atherosclerosis of native arteries of extremities with rest pain, left leg: Secondary | ICD-10-CM | POA: Insufficient documentation

## 2024-04-26 LAB — VAS US ABI WITH/WO TBI
Left ABI: 0.46
Right ABI: 0.7

## 2024-04-28 NOTE — Progress Notes (Unsigned)
 Cardiology Office Note:  .   Date:  04/29/2024  ID:  Gregory Grant, DOB 1966/01/29, MRN [id number] PCP: Juvenal Opoka  [facility] Providers Cardiologist:  Ahmad Alert, MD {  History of Present Illness: .   Gregory Grant is a 58 y.o. male with history of recent STEMI April 2025, inferior STEMI in 2020 with DES to proximal/mid RCA, hypertension, hyperlipidemia, nicotine dependence, alcohol use, HIV.  Patient seen recently admitted April 2025 for what will be his second inferior STEMI, troponins greater than 24,000. He had DES to mid to distal LCx.  Started on aspirin  and Effient .  Echocardiogram not obtained during admission as patient was wanting to leave AMA. Reportedly also was not taking any cardiac meds PTA. Additionally, he saw vascular surgery recently for lower extremity claudication in his left leg.  Now plans for abdominal aortogram, LE angio.  Note does not clearly state he will need procedure or cardiac evaluation.   Today patient presents for hospital follow-up.  He has had complete resolution of chest pain, not using his nitroglycerin .  Compliant with all of his medications.  No issues of bleeding.  Has no complaints.  Although he is making progress he continues to smoke 2 black and milds daily.  He does drink 2 beers daily and a whole bottle of R and R liquor on the weekend.  Smokes marijuana.  He still making an effort to quit these things and is making little progress.  Has had recent deaths in his life so he really is making an effort to be around for his granddaughter and wants to improve his health   ROS: Denies: Chest pain, shortness of breath, orthopnea, peripheral edema, palpitations, decreased exercise intolerance, fatigue, lightheadedness.  Does not demonstrate any CHF symptoms.  Studies Reviewed: Aaron Aas    Coronary angiography and intervention 04/12/2024: LM: No significant disease LAD: Mild diffuse disease Lcx: Mid 60% stenosis (nonculprit), followed by tandem 80  and 99% stenosis in mid to distal vessel (culprit) RCA: Patent proximal and mid stents with no significant restenosis.  Distal RCA focal 40% stenosis.   LVEDP 21 mmHg   Successful percutaneous coronary intervention mid to distal LCx        PTCA and stent placement 2.25 X 28 mm Synergy drug-eluting stent        Post dilatation using 2.25 and 2.5 mm [place] balloons up tp 18 atm     Risk Assessment/Calculations:        Physical Exam:   VS:  BP (!) 144/90 (BP Location: Left Arm, Patient Position: Sitting, Cuff Size: Normal)   Pulse 87   Ht 5\' 8"  (1.727 m)   Wt 176 lb 12.8 oz (80.2 kg)   SpO2 100%   BMI 26.88 kg/m    Wt Readings from Last 3 Encounters:  04/29/24 176 lb 12.8 oz (80.2 kg)  04/26/24 180 lb (81.6 kg)  04/12/24 176 lb 5.9 oz (80 kg)    GEN: Well nourished, well developed in no acute distress NECK: No JVD; No carotid bruits CARDIAC: RRR, no murmurs, rubs, gallops RESPIRATORY:  Clear to auscultation without rales, wheezing or rhonchi  ABDOMEN: Soft, non-tender, non-distended EXTREMITIES:  No edema; No deformity radial cath site free of any acute discharge, erythema, or tenderness.  ASSESSMENT AND PLAN: .    Recent Inferior STEMI  CAD This is his second inferior STEMI in 5 years.  Previous history of STEMI 2020 with DES to proximal/mid RCA.  Recently admitted April 2025 and had stents placed  to mid to distal LCx.  He is doing very well and compliant with all of his medications. Continue DAPT with aspirin  and Effient  till at least April 2026 Rosuvastatin  40 mg.  Lopressor  25 mg twice daily Will obtain echocardiogram, if his EF is down will titrate meds accordingly. Radial cath site free of any acute complications. Does not need his nitroglycerin .  Hypertension Was not taking any of his medications PTA.  Blood pressure here is slightly elevated 144/90 with recent smoking.   He will call Medicaid and obtain blood pressure cuff and log this. Increase losartan  50 to 100 mg  daily, check BMP 1 week from today.  Hyperlipidemia LDL 118 during last admission.  Goal is less than 55. Continue with rosuvastatin  40 mg.  Repeat LDL June 30th  Nicotine dependence Making progress, smoking 2 black and milds daily.  Reports he has tried multiple other things with no success, he is determined to quit on his own.  Alcohol use Drinks 2 beers daily, a bottle of liquor on the weekend.  Encouraged cessation, did not want counseling or other resources.  HIV He is on Biktarvy   Left lower leg claudication Followed by Dr. Edgardo Goodwill.  Plans for abdominal aortogram/lower extremity angiography.  Have not received formal request for any preoperative evaluations.  Please let me know if this is needed depending on findings.    Cardiac Rehabilitation Eligibility Assessment  The patient is ready to start cardiac rehabilitation from a cardiac standpoint.       Dispo: Follow-up with me in 2 to 3 months.  Discuss substance use and follow-up on echocardiogram.  Signed, Burnetta Cart, PA-C

## 2024-04-29 ENCOUNTER — Encounter: Payer: Self-pay | Admitting: General Practice

## 2024-04-29 ENCOUNTER — Ambulatory Visit: Attending: General Practice | Admitting: Cardiology

## 2024-04-29 ENCOUNTER — Ambulatory Visit: Payer: Self-pay | Admitting: Nurse Practitioner

## 2024-04-29 VITALS — BP 144/90 | HR 87 | Ht 68.0 in | Wt 176.8 lb

## 2024-04-29 DIAGNOSIS — B20 Human immunodeficiency virus [HIV] disease: Secondary | ICD-10-CM

## 2024-04-29 DIAGNOSIS — I2121 ST elevation (STEMI) myocardial infarction involving left circumflex coronary artery: Secondary | ICD-10-CM

## 2024-04-29 DIAGNOSIS — F1090 Alcohol use, unspecified, uncomplicated: Secondary | ICD-10-CM

## 2024-04-29 DIAGNOSIS — E785 Hyperlipidemia, unspecified: Secondary | ICD-10-CM | POA: Diagnosis present

## 2024-04-29 DIAGNOSIS — I10 Essential (primary) hypertension: Secondary | ICD-10-CM

## 2024-04-29 DIAGNOSIS — F17200 Nicotine dependence, unspecified, uncomplicated: Secondary | ICD-10-CM | POA: Diagnosis present

## 2024-04-29 MED ORDER — LOSARTAN POTASSIUM 50 MG PO TABS: 50.0000 mg | ORAL_TABLET | Freq: Every day | ORAL | 3 refills | Status: DC

## 2024-04-29 MED ORDER — LOSARTAN POTASSIUM 100 MG PO TABS
100.0000 mg | ORAL_TABLET | Freq: Every day | ORAL | 6 refills | Status: DC
Start: 2024-04-29 — End: 2024-07-11

## 2024-04-29 NOTE — Patient Instructions (Signed)
 Medication Instructions:  INCREASE LOSARTAN  100MG  DAILY *If you need a refill on your cardiac medications before your next appointment, please call your pharmacy*  Lab Work: BMET IN 1 WEEK -and- FASTING LIPID IN 1 MONTH If you have labs (blood work) drawn today and your tests are completely normal, you will receive your results only by:  MyChart Message (if you have MyChart) OR A paper copy in the mail If you have any lab test that is abnormal or we need to change your treatment, we will call you to review the results.  Testing/Procedures: Your physician has requested that you have an echocardiogram. Echocardiography is a painless test that uses sound waves to create images of your heart. It provides your doctor with information about the size and shape of your heart and how well your heart's chambers and valves are working. This procedure takes approximately one hour. There are no restrictions for this procedure. Please do NOT wear cologne, perfume, aftershave, or lotions (deodorant is allowed). Please arrive 15 minutes prior to your appointment time.  Please note: We ask at that you not bring children with you during ultrasound (echo/ vascular) testing. Due to room size and safety concerns, children are not allowed in the ultrasound rooms during exams. Our front office staff cannot provide observation of children in our lobby area while testing is being conducted. An adult accompanying a patient to their appointment will only be allowed in the ultrasound room at the discretion of the ultrasound technician under special circumstances. We apologize for any inconvenience.   Follow-Up: At [facility], you and your health needs are our priority.  As part of our continuing mission to provide you with exceptional heart care, our providers are all part of one team.  This team includes your primary Cardiologist (physician) and Advanced Practice Providers or APPs (Physician Assistants and Nurse  Practitioners) who all work together to provide you with the care you need, when you need it.  Your next appointment:   07-11-2024   Provider:   Ahmad Alert, MD or Morgan Arab, PA-C       Other Instructions CALL YOUR INSURANCE AND SEE WHAT THEY CAN DO TO PROVIDE YOU WITH A BLOOD PRESSURE MONITOR.

## 2024-05-02 ENCOUNTER — Telehealth (HOSPITAL_COMMUNITY): Payer: Self-pay

## 2024-05-02 NOTE — Telephone Encounter (Signed)
 Received medicaid form back from Patwardhan's office. Placed in pt chart and printed.

## 2024-05-03 ENCOUNTER — Telehealth: Payer: Self-pay | Admitting: Cardiology

## 2024-05-03 DIAGNOSIS — E785 Hyperlipidemia, unspecified: Secondary | ICD-10-CM

## 2024-05-03 NOTE — Telephone Encounter (Signed)
 Please call patient to get fasting lipid panel as soon as he is able. Thanks.

## 2024-05-04 NOTE — Telephone Encounter (Signed)
 Called and left message per DPR informing patient that Sylvester Evert would like a fasting cholesterol lab to be done at his earliest convenience. Gave details regarding [facility]'s process.

## 2024-05-13 ENCOUNTER — Ambulatory Visit: Payer: Self-pay

## 2024-05-13 ENCOUNTER — Encounter (HOSPITAL_COMMUNITY): Payer: Self-pay | Admitting: Vascular Surgery

## 2024-05-13 ENCOUNTER — Ambulatory Visit (HOSPITAL_COMMUNITY)
Admission: RE | Admit: 2024-05-13 | Discharge: 2024-05-13 | Disposition: A | Discharge status: Home / Self Care | Attending: Vascular Surgery | Admitting: Vascular Surgery

## 2024-05-13 ENCOUNTER — Encounter (HOSPITAL_COMMUNITY)
Admission: RE | Disposition: A | Discharge status: Home / Self Care | Payer: Self-pay | Source: Home / Self Care | Attending: Vascular Surgery

## 2024-05-13 ENCOUNTER — Emergency Department (HOSPITAL_COMMUNITY)
Admission: EM | Admit: 2024-05-13 | Discharge: 2024-05-13 | Disposition: A | Discharge status: Home / Self Care | Attending: Emergency Medicine | Admitting: Emergency Medicine

## 2024-05-13 ENCOUNTER — Ambulatory Visit: Admitting: Emergency Medicine

## 2024-05-13 ENCOUNTER — Other Ambulatory Visit: Payer: Self-pay

## 2024-05-13 DIAGNOSIS — Z21 Asymptomatic human immunodeficiency virus [HIV] infection status: Secondary | ICD-10-CM | POA: Diagnosis not present

## 2024-05-13 DIAGNOSIS — I70222 Atherosclerosis of native arteries of extremities with rest pain, left leg: Secondary | ICD-10-CM | POA: Diagnosis present

## 2024-05-13 DIAGNOSIS — Z79624 Long term (current) use of inhibitors of nucleotide synthesis: Secondary | ICD-10-CM | POA: Diagnosis not present

## 2024-05-13 DIAGNOSIS — G8918 Other acute postprocedural pain: Secondary | ICD-10-CM | POA: Diagnosis present

## 2024-05-13 DIAGNOSIS — M79652 Pain in left thigh: Secondary | ICD-10-CM | POA: Diagnosis not present

## 2024-05-13 DIAGNOSIS — I252 Old myocardial infarction: Secondary | ICD-10-CM | POA: Diagnosis not present

## 2024-05-13 DIAGNOSIS — I2510 Atherosclerotic heart disease of native coronary artery without angina pectoris: Secondary | ICD-10-CM | POA: Insufficient documentation

## 2024-05-13 DIAGNOSIS — Z7982 Long term (current) use of aspirin: Secondary | ICD-10-CM | POA: Insufficient documentation

## 2024-05-13 DIAGNOSIS — F17290 Nicotine dependence, other tobacco product, uncomplicated: Secondary | ICD-10-CM | POA: Diagnosis not present

## 2024-05-13 DIAGNOSIS — Z79899 Other long term (current) drug therapy: Secondary | ICD-10-CM | POA: Insufficient documentation

## 2024-05-13 DIAGNOSIS — Z955 Presence of coronary angioplasty implant and graft: Secondary | ICD-10-CM | POA: Insufficient documentation

## 2024-05-13 HISTORY — PX: LOWER EXTREMITY ANGIOGRAPHY: CATH118251

## 2024-05-13 HISTORY — PX: LOWER EXTREMITY INTERVENTION: CATH118252

## 2024-05-13 HISTORY — PX: ABDOMINAL AORTOGRAM: CATH118222

## 2024-05-13 LAB — BASIC METABOLIC PANEL WITH GFR
Anion gap: 10 (ref 5–15)
BUN: 6 mg/dL (ref 6–20)
CO2: 23 mmol/L (ref 22–32)
Calcium: 9.4 mg/dL (ref 8.9–10.3)
Chloride: 105 mmol/L (ref 98–111)
Creatinine, Ser: 1.13 mg/dL (ref 0.61–1.24)
GFR, Estimated: >60 mL/min (ref >60)
Glucose, Bld: 106 mg/dL — ABNORMAL HIGH (ref 70–99)
Potassium: 3.9 mmol/L (ref 3.5–5.1)
Sodium: 138 mmol/L (ref 135–145)

## 2024-05-13 LAB — PROTIME-INR
INR: 0.9 (ref 0.8–1.2)
Prothrombin Time: 12.6 s (ref 11.4–15.2)

## 2024-05-13 LAB — POCT I-STAT, CHEM 8
BUN: 8 mg/dL (ref 6–20)
Calcium, Ion: 1.29 mmol/L (ref 1.15–1.40)
Chloride: 104 mmol/L (ref 98–111)
Creatinine, Ser: 1.1 mg/dL (ref 0.61–1.24)
Glucose, Bld: 96 mg/dL (ref 70–99)
HCT: 45 % (ref 39.0–52.0)
Hemoglobin: 15.3 g/dL (ref 13.0–17.0)
Potassium: 3.9 mmol/L (ref 3.5–5.1)
Sodium: 141 mmol/L (ref 135–145)
TCO2: 23 mmol/L (ref 22–32)

## 2024-05-13 LAB — CBC
HCT: 44 % (ref 39.0–52.0)
Hemoglobin: 14.9 g/dL (ref 13.0–17.0)
MCH: 34 pg (ref 26.0–34.0)
MCHC: 33.9 g/dL (ref 30.0–36.0)
MCV: 100.5 fL — ABNORMAL HIGH (ref 80.0–100.0)
Platelets: 162 10*3/uL (ref 150–400)
RBC: 4.38 MIL/uL (ref 4.22–5.81)
RDW: 13.8 % (ref 11.5–15.5)
WBC: 8.5 10*3/uL (ref 4.0–10.5)
nRBC: 0 % (ref 0.0–0.2)

## 2024-05-13 SURGERY — ABDOMINAL AORTOGRAM
Anesthesia: LOCAL

## 2024-05-13 MED ORDER — ACETAMINOPHEN 325 MG PO TABS: 650.0000 mg | ORAL_TABLET | ORAL | Status: DC | PRN

## 2024-05-13 MED ORDER — FENTANYL CITRATE (PF) 100 MCG/2ML IJ SOLN
INTRAMUSCULAR | Status: AC
  Filled 2024-05-13: qty 2

## 2024-05-13 MED ORDER — SODIUM CHLORIDE 0.9 % IV SOLN: INTRAVENOUS | Status: DC

## 2024-05-13 MED ORDER — IODIXANOL 320 MG/ML IV SOLN
INTRAVENOUS | Status: DC | PRN
  Administered 2024-05-13: 75 mL

## 2024-05-13 MED ORDER — SODIUM CHLORIDE 0.9% FLUSH: 3.0000 mL | Freq: Two times a day (BID) | INTRAVENOUS | Status: DC

## 2024-05-13 MED ORDER — HYDRALAZINE HCL 20 MG/ML IJ SOLN
5.0000 mg | INTRAMUSCULAR | Status: DC | PRN
  Administered 2024-05-13: 5 mg via INTRAVENOUS
  Filled 2024-05-13: qty 1

## 2024-05-13 MED ORDER — SODIUM CHLORIDE 0.9 % IV SOLN: 250.0000 mL | INTRAVENOUS | Status: DC | PRN

## 2024-05-13 MED ORDER — SODIUM CHLORIDE 0.9% FLUSH: 3.0000 mL | INTRAVENOUS | Status: DC | PRN

## 2024-05-13 MED ORDER — FENTANYL CITRATE PF 50 MCG/ML IJ SOSY
50.0000 ug | PREFILLED_SYRINGE | Freq: Once | INTRAMUSCULAR | Status: AC
  Administered 2024-05-13: 50 ug via INTRAVENOUS
  Filled 2024-05-13: qty 1

## 2024-05-13 MED ORDER — LABETALOL HCL 5 MG/ML IV SOLN: 10.0000 mg | INTRAVENOUS | Status: DC | PRN

## 2024-05-13 MED ORDER — HEPARIN SODIUM (PORCINE) 1000 UNIT/ML IJ SOLN
INTRAMUSCULAR | Status: DC | PRN
  Administered 2024-05-13: 8000 [IU] via INTRAVENOUS

## 2024-05-13 MED ORDER — LIDOCAINE HCL (PF) 1 % IJ SOLN
INTRAMUSCULAR | Status: AC
  Filled 2024-05-13: qty 30

## 2024-05-13 MED ORDER — MIDAZOLAM HCL 2 MG/2ML IJ SOLN
INTRAMUSCULAR | Status: DC | PRN
Start: 2024-05-13 — End: 2024-05-13
  Administered 2024-05-13: 1 mg via INTRAVENOUS

## 2024-05-13 MED ORDER — HEPARIN SODIUM (PORCINE) 1000 UNIT/ML IJ SOLN
INTRAMUSCULAR | Status: AC
Start: 2024-05-13 — End: ?
  Filled 2024-05-13: qty 10

## 2024-05-13 MED ORDER — MIDAZOLAM HCL 2 MG/2ML IJ SOLN
INTRAMUSCULAR | Status: AC
  Filled 2024-05-13: qty 2

## 2024-05-13 MED ORDER — LIDOCAINE-EPINEPHRINE 1 %-1:100000 IJ SOLN
INTRAMUSCULAR | Status: DC | PRN
  Administered 2024-05-13: 1.5 mL via INTRADERMAL

## 2024-05-13 MED ORDER — LIDOCAINE-EPINEPHRINE 1 %-1:100000 IJ SOLN
INTRAMUSCULAR | Status: AC
  Filled 2024-05-13: qty 1

## 2024-05-13 MED ORDER — SODIUM CHLORIDE 0.9 % WEIGHT BASED INFUSION: 1.0000 mL/kg/h | INTRAVENOUS | Status: DC

## 2024-05-13 MED ORDER — HEPARIN (PORCINE) IN NACL 1000-0.9 UT/500ML-% IV SOLN
INTRAVENOUS | Status: DC | PRN
  Administered 2024-05-13 (×2): 500 mL

## 2024-05-13 MED ORDER — ONDANSETRON HCL 4 MG/2ML IJ SOLN: 4.0000 mg | Freq: Four times a day (QID) | INTRAMUSCULAR | Status: DC | PRN

## 2024-05-13 MED ORDER — FENTANYL CITRATE (PF) 100 MCG/2ML IJ SOLN
INTRAMUSCULAR | Status: DC | PRN
  Administered 2024-05-13 (×2): 50 ug via INTRAVENOUS

## 2024-05-13 SURGICAL SUPPLY — 16 items
BALLOON MUSTANG 6X150X135 (BALLOONS) IMPLANT
CATH OMNI FLUSH 5F 65CM (CATHETERS) IMPLANT
CATH QUICKCROSS .035X135CM (MICROCATHETER) IMPLANT
CLOSURE PERCLOSE PROSTYLE (VASCULAR PRODUCTS) IMPLANT
COVER DOME SNAP 22 D (MISCELLANEOUS) IMPLANT
GLIDEWIRE ADV .035X260CM (WIRE) IMPLANT
KIT ENCORE 26 ADVANTAGE (KITS) IMPLANT
KIT MICROPUNCTURE NIT STIFF (SHEATH) IMPLANT
KIT SYRINGE INJ CVI SPIKEX1 (MISCELLANEOUS) IMPLANT
SET ATX-X65L (MISCELLANEOUS) IMPLANT
SHEATH CATAPULT 6F 45 MP (SHEATH) IMPLANT
SHEATH PINNACLE 5F 10CM (SHEATH) IMPLANT
SHEATH PROBE COVER 6X72 (BAG) IMPLANT
STENT ELUVIA 7X150X130 (Permanent Stent) IMPLANT
TRAY PV CATH (CUSTOM PROCEDURE TRAY) ×2 IMPLANT
WIRE BENTSON .035X145CM (WIRE) IMPLANT

## 2024-05-13 NOTE — Telephone Encounter (Signed)
 Chief Complaint: leg pain Symptoms: leg pain Frequency: ongoing, worse today Pertinent Negatives: Patient denies swelling, bruising, numbness, discoloration, CP, SOB Disposition: [x] ED /[] Urgent Care (no appt availability in office) / [] Appointment(In office/virtual)/ []  [facility] [facility]/ [] Home Care/ [] Refused Recommended Disposition /[] [facility]/ []  Follow-up with PCP Additional Notes: Pt had a cardiac cath today at [facility] via his femoral artery. Pt reports severe 8/10 L leg pain with throbbing. Pt states the pain is to the front of his leg. Pt reports pain is usually to his calf (hx of claudication). Pt has never had pain to this area of his leg. Pt states the pain is worse when lying down or standing still. RN advised pt be seen by a HCP, pt has no PCP. RN advised ED. Pt agreeable to that plan and verbalized understanding. Pt states he will call another day to establish a PCP with [facility].    Copied from CRM [id number]. Topic: Clinical - Red Word Triage >> May 13, 2024  1:56 PM Gregory Grant wrote: Red Word that prompted transfer to Nurse Triage: Left leg throbbing following procedure, severe pain. Reason for Disposition  Patient sounds very sick or weak to the triager  Answer Assessment - Initial Assessment Questions 1. ONSET: "When did the pain start?"      5 minutes after he got home from a procedure today; pain is worse when he stands still or lies down 2. LOCATION: "Where is the pain located?"      L leg - "top muscle" 3. PAIN: "How bad is the pain?"    (Scale 1-10; or mild, moderate, severe)   -  MILD (1-3): doesn't interfere with normal activities    -  MODERATE (4-7): interferes with normal activities (e.g., work or school) or awakens from sleep, limping    -  SEVERE (8-10): excruciating pain, unable to do any normal activities, unable to walk     8/10  4. WORK OR EXERCISE: "Has there been any recent work or exercise that involved this part of the body?"       Pt had a procedure today 5. CAUSE: "What do you think is causing the leg pain?"     Procedure from today 6. OTHER SYMPTOMS: "Do you have any other symptoms?" (e.g., chest pain, back pain, breathing difficulty, swelling, rash, fever, numbness, weakness)     Pt states he had a catheterization today, went through his femoral artery. Pt denies swelling or bruising there. Denies numbness, weakness, tingling. Denies fever or chills. Denies CP or SOB. Denies any swelling. Denies calf pain or swelling. Denies skin redness or discoloration.  Pt states usually his leg pain would be in his calf d/t DVT & claudication. Now his pain is in the front of his leg, he's never had pain here before  Protocols used: Leg Pain-A-AH

## 2024-05-13 NOTE — ED Provider Notes (Signed)
 [place] EMERGENCY DEPARTMENT AT [facility] Provider Note   CSN: [id number] Arrival date & time: 05/13/24  1455     History  Chief Complaint  Patient presents with   Post-op Problem    Gregory Grant is a 58 y.o. male.  HPI Patient with peripheral artery disease.  Today had angiogram and left femoral artery stenting by Dr. Edgardo Goodwill.  States more pain and bleeding at the site of the access.  Also states now more pain in the left thigh.  Reportedly bled through gauze 3 times for EMS.     Past Medical History:  Diagnosis Date   Coughing 02/2015   GERD (gastroesophageal reflux disease)    HIV (human immunodeficiency virus infection) (HCC)    Peripheral arterial disease (HCC)     Home Medications Prior to Admission medications   Medication Sig Start Date End Date Taking? Authorizing Provider  acetaminophen  (TYLENOL ) 325 MG tablet Take 2 tablets (650 mg total) by mouth every 4 (four) hours as needed for headache or mild pain (pain score 1-3). 04/13/24   Carleen Chary, DO  aspirin  EC 81 MG tablet Take 1 tablet (81 mg total) by mouth daily. Swallow whole. 04/14/24   Carleen Chary, DO  bictegravir-emtricitabine -tenofovir  AF (BIKTARVY ) 50-200-25 MG TABS tablet Take 1 tablet by mouth daily. 02/19/24   Lina Render, MD  losartan  (COZAAR ) 100 MG tablet Take 1 tablet (100 mg total) by mouth daily. 04/29/24   Burnetta Cart, PA-C  metoprolol  tartrate (LOPRESSOR ) 25 MG tablet Take 1 tablet (25 mg total) by mouth 2 (two) times daily. 08/03/23   Nahser, Lela Purple, MD  nitroGLYCERIN  (NITROSTAT ) 0.4 MG SL tablet Place 1 tablet (0.4 mg total) under the tongue every 5 (five) minutes as needed for chest pain. 08/03/23   Nahser, Lela Purple, MD  prasugrel  (EFFIENT ) 10 MG TABS tablet Take 1 tablet (10 mg total) by mouth daily. 04/14/24   Carleen Chary, DO  rosuvastatin  (CRESTOR ) 40 MG tablet Take 1 tablet (40 mg total) by mouth daily. 04/14/24   Carleen Chary, DO  famotidine   (PEPCID ) 20 MG tablet Take 1 tablet (20 mg total) by mouth 2 (two) times daily. To protect stomach 10/11/20 12/25/20  Berneda Bridges, MD      Allergies    Patient has no known allergies.    Review of Systems   Review of Systems  Physical Exam Updated Vital Signs BP (!) 185/110   Pulse 68   Temp 98.1 F (36.7 C) (Oral)   Resp 16   SpO2 100%  Physical Exam Vitals reviewed.  Cardiovascular:     Rate and Rhythm: Regular rhythm.  Abdominal:     Tenderness: There is no abdominal tenderness.  Musculoskeletal:        General: Tenderness present.     Comments: Does have tenderness to left mid thigh.  Pulse palpable in the left foot.  Right groin has 2 Band-Aids overlapping at site of arterial puncture.  Some fresh blood there but not brisk bleeding.  Pulse also intact in right foot.  Tenderness to the site of the right groin.  Neurological:     Mental Status: He is alert.     ED Results / Procedures / Treatments   Labs (all labs ordered are listed, but only abnormal results are displayed) Labs Reviewed  CBC - Abnormal; Notable for the following components:      Result Value   MCV 100.5 (*)    All other components within normal limits  BASIC METABOLIC PANEL WITH GFR - Abnormal; Notable for the following components:   Glucose, Bld 106 (*)    All other components within normal limits  PROTIME-INR    EKG None  Radiology PERIPHERAL VASCULAR CATHETERIZATION Result Date: 05/13/2024 Table formatting from the original result was not included. DATE OF SERVICE: 05/13/2024  PATIENT:  Gregory Grant  58 y.o. male  PRE-OPERATIVE DIAGNOSIS:  Atherosclerosis of native arteries of left lower extremity causing rest pain  POST-OPERATIVE DIAGNOSIS:  Same  PROCEDURE:  1) Ultrasound guided right common femoral artery access 2) Aortogram 3) Left lower extremity angiogram with second order cannulation 4) additional left lower extremity angiogram with third order cannulation 5) left superficial femoral  artery angioplasty and stenting (7x175mm Eluvia) 6) Conscious sedation (34 minutes)  SURGEON:  Heber Little. Edgardo Goodwill, MD  ASSISTANT: none  ANESTHESIA:   local and IV sedation  ESTIMATED BLOOD LOSS: minimal  LOCAL MEDICATIONS USED:  LIDOCAINE   COUNTS: confirmed correct.  PATIENT DISPOSITION:  PACU - hemodynamically stable.  Delay start of Pharmacological VTE agent (>24hrs) due to surgical blood loss or risk of bleeding: no  INDICATION FOR PROCEDURE: Gregory Grant is a 58 y.o. male with left leg atherosclerosis and ischemic rest pain. After careful discussion of risks, benefits, and alternatives the patient was offered angiogram with intervention. The patient understood and wished to proceed.  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 FP 3. IP 0. Stage 2.  WIfI score. N/A.  DESCRIPTION OF PROCEDURE: After identification of the patient in the pre-operative holding area, the patient was  transferred to the operating room. The patient was positioned supine on the operating room table. Anesthesia was induced. The groins was prepped and draped in standard fashion. A surgical pause was performed confirming correct patient, procedure, and operative location.  The right groin was anesthetized with subcutaneous injection of 1% lidocaine . Using ultrasound guidance, the right common femoral artery was accessed with micropuncture technique.  Fluoroscopy was used to confirm cannulation over the femoral head. The 283F micropuncture sheath was upsized to 83F.  A Benson wire was advanced into the distal aorta. Over the wire an omni flush catheter was advanced to the level of L2. Aortogram was performed - see above for details.  The left common iliac artery was selected with an omniflush catheter and glidewire advantage guidewire. The wire was advanced into the common femoral artery. Over the wire the omni flush catheter was advanced into the external iliac artery. Selective angiography was performed - see above for details.  The decision was made to intervene. The patient was heparinized with 8,000 units of heparin . The 83F sheath was exchanged for  a 52F x 45cm sheath. Selective angiography of the left lower extremity was performed prior to intervention.  The lesions were treated with: Left superficial femoral artery angioplasty and stenting (7x135mm Eluvia)  Completion angiography revealed: Resolution of L SFA occlusion  A perclose was used to close the arteriotomy. Hemostasis was excellent upon completion.  Conscious sedation was administered with the use of IV fentanyl  and midazolam  under continuous physician and nurse monitoring.  Heart rate, blood pressure, and oxygen saturation were continuously monitored.  Total sedation time was 34 minutes  Upon completion of the case instrument and sharps counts were confirmed correct. The patient was transferred to the PACU in good condition. I was present for all portions  of the procedure.  PLAN:  Abstinence from all tobacco products. Blood glucose control with goal A1c < 7%. Blood pressure control with goal blood pressure < 130/80 mmHg. Lipid reduction therapy with goal LDL-C < 55 mg/dL. Aspirin  81mg  by mouth daily. Prasugrel  10mg  by mouth daily Atorvastatin  40-80mg  PO QD (or other "high intensity" statin therapy).  Follow up with me in 1 month with ABI and LLE duplex. Continue DAPT x 1 month from my standpoint. Decision to discontinue DAPT should be made with cardiology. Needs aggressive medical management and smoking cessation.   Heber Little. Edgardo Goodwill, MD FACS [facility] Phone Number: [phone] 05/13/2024 8:31 AM     Procedures Procedures    Medications Ordered in ED Medications  fentaNYL  (SUBLIMAZE ) injection 50 mcg (50 mcg Intravenous Given 05/13/24 1534)    ED Course/ Medical Decision Making/ A&P                                 Medical Decision Making Amount and/or Complexity of Data Reviewed Labs: ordered.  Risk Prescription drug management.   Patient with pain on the left thigh after angiogram with stenting and patient has pain with bleeding at right groin at site of vascular access.  Will get basic blood work and discussed with vascular surgery.  I have reviewed operative note.  Dr. Fulton Job is seen patient.  Doubt occlusion of stent with good pulse in the foot.  Did have some bleeding on the right groin thought to be due to the skin.  Dermabond placed.  Will watch and then discharge home to follow-up with Dr. Edgardo Goodwill.         Final Clinical Impression(s) / ED Diagnoses Final diagnoses:  Post-operative pain    Rx / DC Orders ED Discharge Orders     None         Mozell Arias, MD 05/13/24 1615

## 2024-05-13 NOTE — Discharge Instructions (Signed)
 Your workup was reassuring.  Follow-up with Dr. Edgardo Goodwill

## 2024-05-13 NOTE — Progress Notes (Signed)
 Client refused to stay until discharge ride gets here; client signed AMA form and left

## 2024-05-13 NOTE — Progress Notes (Signed)
 I was called to see Gregory Grant after he left AMA following right transfemoral access with left leg intervention including SFA stent by Dr. Edgardo Goodwill today.  Complaining of left thigh pain.  Left thigh without notable hematoma.  Has a palpable DP pulse in the left foot.  Suspect the pain in his thigh is from the SFA stent placement.  Right groin without hematoma and has a palpable femoral pulse.  Appears to be oozing from the skin where the sheath was placed.  I did reinforce this with Dermabond.  Okay for discharge.  Gregory Hensen, MD [facility] [place] Office: [phone]   Gregory Grant

## 2024-05-13 NOTE — Interval H&P Note (Signed)
 History and Physical Interval Note:  05/13/2024 8:31 AM  Gregory Grant  has presented today for surgery, with the diagnosis of atherosclerosis left lower extremity with rest pain.  The various methods of treatment have been discussed with the patient and family. After consideration of risks, benefits and other options for treatment, the patient has consented to  Procedure(s): ABDOMINAL AORTOGRAM (N/A) Lower Extremity Angiography (N/A) LOWER EXTREMITY INTERVENTION (N/A) as a surgical intervention.  The patient's history has been reviewed, patient examined, no change in status, stable for surgery.  I have reviewed the patient's chart and labs.  Questions were answered to the patient's satisfaction.     Carlene Che

## 2024-05-13 NOTE — ED Triage Notes (Signed)
 Patient BIB [facility] from home after procedure in IR today to remove blood clot. Patient reports bleeding and pain at site and was told to return for eval. 10/10 pain at groin insertion site, bled through gauze at home. BP 160/102 HR 64 99% RA

## 2024-05-13 NOTE — Op Note (Signed)
 DATE OF SERVICE: 05/13/2024  PATIENT:  Gregory Grant  58 y.o. male  PRE-OPERATIVE DIAGNOSIS:  Atherosclerosis of native arteries of left lower extremity causing rest pain  POST-OPERATIVE DIAGNOSIS:  Same  PROCEDURE:   1) Ultrasound guided right common femoral artery access 2) Aortogram 3) Left lower extremity angiogram with second order cannulation 4) additional left lower extremity angiogram with third order cannulation 5) left superficial femoral artery angioplasty and stenting (7x157mm Eluvia) 6) Conscious sedation (34 minutes)  SURGEON:  Heber Little. Edgardo Goodwill, MD  ASSISTANT: none  ANESTHESIA:   local and IV sedation  ESTIMATED BLOOD LOSS: minimal  LOCAL MEDICATIONS USED:  LIDOCAINE    COUNTS: confirmed correct.  PATIENT DISPOSITION:  PACU - hemodynamically stable.   Delay start of Pharmacological VTE agent (>24hrs) due to surgical blood loss or risk of bleeding: no  INDICATION FOR PROCEDURE: Gregory Grant is a 58 y.o. male with left leg atherosclerosis and ischemic rest pain. After careful discussion of risks, benefits, and alternatives the patient was offered angiogram with intervention. The patient understood and wished to proceed.  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 FP 3. IP 0. Stage 2.  WIfI score. N/A.   DESCRIPTION OF PROCEDURE: After identification of the patient in the pre-operative holding area, the patient was transferred to the operating room. The patient was positioned supine on the operating room table. Anesthesia was induced. The groins was prepped and draped in standard fashion. A surgical pause was performed confirming correct patient, procedure, and operative location.  The right groin was anesthetized with subcutaneous injection of 1% lidocaine . Using ultrasound guidance, the right common femoral artery was accessed with micropuncture technique.  Fluoroscopy was used to confirm cannulation over the femoral head. The 753F micropuncture sheath was upsized to 53F.   A Benson wire was advanced into the distal aorta. Over the wire an omni flush catheter was advanced to the level of L2. Aortogram was performed - see above for details.   The left common iliac artery was selected with an omniflush catheter and glidewire advantage guidewire. The wire was advanced into the common femoral artery. Over the wire the omni flush catheter was advanced into the external iliac artery. Selective angiography was performed - see above for details.   The decision was made to intervene. The patient was heparinized with 8,000 units of heparin . The 53F sheath was exchanged for a 76F x 45cm sheath. Selective angiography of the left lower extremity was performed prior to intervention.   The lesions were treated with: Left superficial femoral artery angioplasty and  stenting (7x134mm Eluvia)  Completion angiography  revealed:  Resolution of L SFA occlusion  A perclose was used to close the arteriotomy. Hemostasis was excellent upon completion.   Conscious sedation was administered with the use of IV fentanyl  and midazolam  under continuous physician and nurse monitoring.  Heart rate, blood pressure, and oxygen saturation were continuously monitored.  Total sedation time was 34 minutes  Upon completion of the case instrument and sharps counts were confirmed correct. The patient was transferred to the PACU in good condition. I was present for all portions of the procedure.  PLAN:   Abstinence from all tobacco products. Blood glucose control with goal A1c < 7%. Blood pressure control with goal blood pressure < 130/80 mmHg. Lipid reduction therapy with goal LDL-C < 55 mg/dL. Aspirin  81mg  by mouth daily. Prasugrel  10mg  by mouth daily Atorvastatin  40-80mg  PO QD (or other "high intensity" statin therapy).  Follow up with me in 1 month with ABI and LLE duplex. Continue DAPT x 1 month from my standpoint. Decision to discontinue DAPT should be made with cardiology. Needs aggressive medical management and smoking cessation.   Heber Little. Edgardo Goodwill, MD [facility] Phone Number: [phone] 05/13/2024 8:31 AM

## 2024-05-20 ENCOUNTER — Other Ambulatory Visit: Payer: Self-pay | Admitting: *Deleted

## 2024-05-20 DIAGNOSIS — I70222 Atherosclerosis of native arteries of extremities with rest pain, left leg: Secondary | ICD-10-CM

## 2024-06-07 ENCOUNTER — Ambulatory Visit (HOSPITAL_COMMUNITY)
Admission: RE | Admit: 2024-06-07 | Discharge: 2024-06-07 | Disposition: A | Discharge status: Home / Self Care | Source: Ambulatory Visit | Attending: Internal Medicine | Admitting: Internal Medicine

## 2024-06-07 ENCOUNTER — Ambulatory Visit (HOSPITAL_BASED_OUTPATIENT_CLINIC_OR_DEPARTMENT_OTHER)
Admit: 2024-06-07 | Discharge: 2024-06-07 | Disposition: A | Discharge status: Home / Self Care | Attending: Internal Medicine | Admitting: Internal Medicine

## 2024-06-07 DIAGNOSIS — I2121 ST elevation (STEMI) myocardial infarction involving left circumflex coronary artery: Secondary | ICD-10-CM | POA: Diagnosis present

## 2024-06-07 DIAGNOSIS — I70222 Atherosclerosis of native arteries of extremities with rest pain, left leg: Secondary | ICD-10-CM | POA: Diagnosis not present

## 2024-06-07 LAB — ECHOCARDIOGRAM COMPLETE
Area-P 1/2: 3.12 cm2
S' Lateral: 3.2 cm

## 2024-06-07 LAB — VAS US ABI WITH/WO TBI
Left ABI: 0.94
Right ABI: 0.77

## 2024-06-08 ENCOUNTER — Ambulatory Visit: Payer: Self-pay | Admitting: Cardiology

## 2024-06-14 ENCOUNTER — Ambulatory Visit: Admitting: Vascular Surgery

## 2024-06-28 NOTE — Progress Notes (Deleted)
 Cardiology Office Note:    Date:  06/28/2024   ID:  Gregory Grant, DOB Oct 31, 1966, MRN [id number]  PCP:  Pcp, No  Cardiologist:  Aleene Passe, MD { Click to update primary MD,subspecialty MD or APP then REFRESH:1}    Referring MD: No ref. provider found   Chief Complaint: follow-up of CAD  History of Present Illness:    Gregory Grant is a 58 y.o. male with a history of CAD with inferior STEMI in 06/2019 s/p DES to proximal RCA and DES to mid RCA and more recent inferior STEMI in 03/2024 s/p DES to LCX, PAD s/p stenting of left SFA in 04/2024, mild aortic root dilatation, hypertension, hyperlipidemia, HIV, tobacco abuse, and alcohol abuse who presents today for routine follow-up.   Patient was admitted in 06/2019 with inferior STEMI. LHC showed 85% stenosis of proximal RCA followed by 100% stenosis of mid RCA and 30% stenosis of distal RCA as well as tandem 75% and 95% stenoses of LPDA branch of the LCX.  He underwent successful PCI with DES to both proximal and mid RCA lesions. Echo showed LVEF of 60-65% at that time.  He was recently readmitted in 03/2024 for another STEMI. LHC showed patent RCA stents with 60% stenosis of mid LCX followed by tandem 80% and 99% stenoses of mid to distal LCX. He underwent PCI with DES to mid to distal LCX. Patient was adamant on leaving before post MI Echo could be done so plan was to get this as an outpatient.  He was seen by Thom Sluder, PA-C, in 04/2024 at which time he denied any recurrent chest pain but he continued to smoke and drink. Echo was ordered and showed LVEF of 55-60% with normal wall motion, mild LVH, and grade 1 diastolic dysfunction as well as mild dilatation of the aortic root measuring 42 mm and borderline dilatation of the ascending aorta measuring 39 mm.   He also follows with Vascular Surgery for claudication. He underwent peripheral angiography with Dr. Magda in 04/2024 and was found to have an occlusion of left SFA which was treated  with angioplasty and stenting. Repeat lower extremity dopplers in 05/2024 showed weidely patent mid SFA stent. ABIs were moderately reduced on the right (0.77) and mildly reduced on the left (0.94).  Patient presents today for follow-up. ***  CAD History of STEMI in 06/2019 s/p DES to proximal RCA and DES to mid RCA and more recent STEMI in 03/2024 s/p DES to LCX. - No chest pain.  - Continue Lopressor  25mg  twice daily.  - Continue DAPT with Aspirin  81mg  daily and Effient  10mg  daily.  - Continue Crestor  40mg  daily.   PAD S/p stenting to left SFA in 04/2024. Repeat lower extremity dopplers in 05/2024 showed weidely patent mid SFA stent. ABIs were moderately reduced on the right (0.77) and mildly reduced on the left (0.94). - *** - Continue DAPT and statin therapy as above. - Followed by Vascular Surgery.  Mild Dilatation of Aortic Root Recent Echo in 05/2024 showed mild dilatation of the aortic root measuring 42 mm and borderline dilatation of the ascending aorta measuring 39 mm.  - Can repeat Echo in 05/2025.   Hypertension BP *** - Continue Losartan  100mg  daily and Lopressor  25mg  twice daily.   Hyperlipidemia Lipid panel in 03/2024 at time of STEMI: Total Cholesterol 173, Triglycerides 76, HDL 40, LDL 118. LDL goal <55. - He was started on Crestor  40mg  daily at time of most recent STEMI. Continue. - Will repeat lipid  panel and LFTs. ***  Tobacco Abuse ***  Alcohol Abuse ***   EKGs/Labs/Other Studies Reviewed:    The following studies were reviewed:  Left Cardiac Catheterization 04/12/2024: LM: No significant disease LAD: Mild diffuse disease Lcx: Mid 60% stenosis (nonculprit), followed by tandem 80 and 99% stenosis in mid to distal vessel (culprit) RCA: Patent proximal and mid stents with no significant restenosis.  Distal RCA focal 40% stenosis.   LVEDP 21 mmHg   Successful percutaneous coronary intervention mid to distal LCx PTCA and stent placement 2.25 X 28 mm Synergy  drug-eluting stent Post dilatation using 2.25 and 2.5 mm [place] balloons up tp 18 atm     _______________  Echocardiogram 06/07/2024: Impressions: 1. Left ventricular ejection fraction, by estimation, is 55 to 60%. Left  ventricular ejection fraction by 3D volume is 52 %. The left ventricle has  normal function. The left ventricle has no regional wall motion  abnormalities. There is mild concentric  left ventricular hypertrophy. Left ventricular diastolic parameters are  consistent with Grade I diastolic dysfunction (impaired relaxation). The  average left ventricular global longitudinal strain is -16.2 %. The global  longitudinal strain is normal.   2. Right ventricular systolic function is normal. The right ventricular  size is normal.   3. The mitral valve is normal in structure. No evidence of mitral valve  regurgitation. No evidence of mitral stenosis.   4. The aortic valve is tricuspid. There is mild calcification of the  aortic valve. Aortic valve regurgitation is trivial. Aortic valve  sclerosis/calcification is present, without any evidence of aortic  stenosis.   5. Aortic dilatation noted. There is mild dilatation of the aortic root,  measuring 42 mm. There is borderline dilatation of the ascending aorta,  measuring 39 mm.   6. The inferior vena cava is normal in size with greater than 50%  respiratory variability, suggesting right atrial pressure of 3 mmHg.  _______________  Lower Extremity Arterial Dopplers/ ABIs 06/07/2024: Summary:  Left: Atherosclerosis throughout.  Widely patent proximal to mid SFA stent without evidence of stenosis.   ABI/ TBI Summary: Right: Resting right ankle-brachial index indicates moderate right lower  extremity arterial disease. The right toe-brachial index is abnormal.   Left: Resting left ankle-brachial index indicates mild left lower  extremity arterial disease. The left toe-brachial index is abnormal.     EKG:  EKG not ordered  today.  Recent Labs: 04/12/2024: ALT 12; Magnesium  2.0 05/13/2024: BUN 6; Creatinine, Ser 1.13; Hemoglobin 14.9; Platelets 162; Potassium 3.9; Sodium 138  Recent Lipid Panel    Component Value Date/Time   CHOL 173 04/12/2024 0533   CHOL 139 11/03/2023 0929   TRIG 76 04/12/2024 0533   HDL 40 (L) 04/12/2024 0533   HDL 35 (L) 11/03/2023 0929   CHOLHDL 4.3 04/12/2024 0533   VLDL 15 04/12/2024 0533   LDLCALC 118 (H) 04/12/2024 0533   LDLCALC 82 11/03/2023 0929   LDLCALC 119 (H) 09/11/2022 1058    Physical Exam:    Vital Signs: There were no vitals taken for this visit.    Wt Readings from Last 3 Encounters:  05/13/24 171 lb (77.6 kg)  04/29/24 176 lb 12.8 oz (80.2 kg)  04/26/24 180 lb (81.6 kg)     General: 58 y.o. male in no acute distress. HEENT: Normocephalic and atraumatic. Sclera clear.  Neck: Supple. No carotid bruits. No JVD. Heart: *** RRR. Distinct S1 and S2. No murmurs, gallops, or rubs.  Lungs: No increased work of breathing. Clear to  ausculation bilaterally. No wheezes, rhonchi, or rales.  Abdomen: Soft, non-distended, and non-tender to palpation.  Extremities: No lower extremity edema.  Radial and distal pedal pulses 2+ and equal bilaterally. Skin: Warm and dry. Neuro: No focal deficits. Psych: Normal affect. Responds appropriately.   Assessment:    No diagnosis found.  Plan:     Disposition: Follow up in ***   Signed, Aline FORBES Door, PA-C  06/28/2024 12:43 PM    [facility]

## 2024-07-01 ENCOUNTER — Ambulatory Visit (HOSPITAL_COMMUNITY)
Admission: EM | Admit: 2024-07-01 | Discharge: 2024-07-01 | Disposition: A | Discharge status: Home / Self Care | Attending: Family Medicine | Admitting: Family Medicine

## 2024-07-01 ENCOUNTER — Encounter (HOSPITAL_COMMUNITY): Payer: Self-pay

## 2024-07-01 DIAGNOSIS — L0291 Cutaneous abscess, unspecified: Secondary | ICD-10-CM | POA: Diagnosis not present

## 2024-07-01 MED ORDER — AMOXICILLIN-POT CLAVULANATE 875-125 MG PO TABS: 1.0000 | ORAL_TABLET | Freq: Two times a day (BID) | ORAL | 0 refills | Status: AC

## 2024-07-01 MED ORDER — LIDOCAINE-EPINEPHRINE 1 %-1:100000 IJ SOLN
INTRAMUSCULAR | Status: AC
Start: 2024-07-01 — End: 2024-07-01
  Filled 2024-07-01: qty 1

## 2024-07-01 MED ORDER — HYDROCODONE-ACETAMINOPHEN 5-325 MG PO TABS
1.0000 | ORAL_TABLET | Freq: Once | ORAL | Status: AC
  Administered 2024-07-01: 1 via ORAL

## 2024-07-01 MED ORDER — LIDOCAINE-EPINEPHRINE-TETRACAINE (LET) TOPICAL GEL
3.0000 mL | Freq: Once | TOPICAL | Status: AC
  Administered 2024-07-01: 3 mL via TOPICAL

## 2024-07-01 MED ORDER — LIDOCAINE-EPINEPHRINE-TETRACAINE (LET) TOPICAL GEL
TOPICAL | Status: AC
  Filled 2024-07-01: qty 3

## 2024-07-01 MED ORDER — HYDROCODONE-ACETAMINOPHEN 5-325 MG PO TABS: 1.0000 | ORAL_TABLET | Freq: Four times a day (QID) | ORAL | 0 refills | Status: DC | PRN

## 2024-07-01 MED ORDER — HYDROCODONE-ACETAMINOPHEN 5-325 MG PO TABS
ORAL_TABLET | ORAL | Status: AC
Start: 2024-07-01 — End: 2024-07-01
  Filled 2024-07-01: qty 1

## 2024-07-01 NOTE — ED Triage Notes (Signed)
 Patient presenting with an abscess on the right nipple onset 2 days ago. States the area is painful but no drainage. States had an abscess I the same spot 3 years ago.   Prescriptions or OTC medications tried: No

## 2024-07-01 NOTE — ED Provider Notes (Signed)
 [facility]    CSN: [id number] Arrival date & time: 07/01/24  1244      History   Chief Complaint Chief Complaint  Patient presents with   Abscess    HPI Gregory Grant is a 58 y.o. male.    Abscess  Here for swelling and pain on his right nipple area x 3 days.   NKDA  Has had it infected and drained some time ago in the same place.  Takes Biktarvy  for HIV.   Past Medical History:  Diagnosis Date   Coughing 02/2015   GERD (gastroesophageal reflux disease)    HIV (human immunodeficiency virus infection) (HCC)    Peripheral arterial disease (HCC)     Patient Active Problem List   Diagnosis Date Noted   STEMI involving left circumflex coronary artery (HCC) 04/12/2024   Primary hypertension 04/12/2024   Claudication of both lower extremities (HCC) 09/04/2023   Acute ST elevation myocardial infarction (STEMI) of inferolateral wall (HCC) 06/24/2019    Class: Hospitalized for   Coronary artery disease involving native coronary artery of native heart with unstable angina pectoris (HCC) 06/24/2019   Acute ST elevation myocardial infarction (STEMI) involving right coronary artery (HCC) 06/24/2019   Medication monitoring encounter 06/21/2018   Alcohol use 03/30/2018   Right hip pain 03/30/2018   Mixed hyperlipidemia 03/30/2018   Screening examination for venereal disease 12/22/2017   Healthcare maintenance 12/22/2017   Human immunodeficiency virus (HIV) disease (HCC) 04/13/2017   Tobacco abuse counseling 04/13/2017   Prediabetes 03/16/2015   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/14/2015    Past Surgical History:  Procedure Laterality Date   ABDOMINAL AORTOGRAM N/A 05/13/2024   Procedure: ABDOMINAL AORTOGRAM;  Surgeon: Magda Debby SAILOR, MD;  Location: [facility];  Service: Cardiovascular;  Laterality: N/A;   CORONARY STENT INTERVENTION N/A 06/24/2019   Procedure: CORONARY STENT INTERVENTION;  Surgeon: Anner Alm ORN, MD;  Location: [facility] INVASIVE CV  LAB;  Service: Cardiovascular;  Laterality: N/A;   CORONARY/GRAFT ACUTE MI REVASCULARIZATION N/A 06/24/2019   Procedure: Coronary/Graft Acute MI Revascularization;  Surgeon: Anner Alm ORN, MD;  Location: [facility] INVASIVE CV LAB;  Service: Cardiovascular;  Laterality: N/A;   CORONARY/GRAFT ACUTE MI REVASCULARIZATION N/A 04/12/2024   Procedure: Coronary/Graft Acute MI Revascularization;  Surgeon: Elmira Newman PARAS, MD;  Location: [facility];  Service: Cardiovascular;  Laterality: N/A;   LEFT HEART CATH AND CORONARY ANGIOGRAPHY N/A 06/24/2019   Procedure: LEFT HEART CATH AND CORONARY ANGIOGRAPHY;  Surgeon: Anner Alm ORN, MD;  Location: [facility] INVASIVE CV LAB;  Service: Cardiovascular;  Laterality: N/A;   LEFT HEART CATH AND CORONARY ANGIOGRAPHY N/A 04/12/2024   Procedure: LEFT HEART CATH AND CORONARY ANGIOGRAPHY;  Surgeon: Elmira Newman PARAS, MD;  Location: [facility];  Service: Cardiovascular;  Laterality: N/A;   LOWER EXTREMITY ANGIOGRAPHY Left 05/13/2024   Procedure: Lower Extremity Angiography;  Surgeon: Magda Debby SAILOR, MD;  Location: [facility] INVASIVE CV LAB;  Service: Cardiovascular;  Laterality: Left;   LOWER EXTREMITY INTERVENTION Left 05/13/2024   Procedure: LOWER EXTREMITY INTERVENTION;  Surgeon: Magda Debby SAILOR, MD;  Location: [facility];  Service: Cardiovascular;  Laterality: Left;       Home Medications    Prior to Admission medications   Medication Sig Start Date End Date Taking? Authorizing Provider  amoxicillin -clavulanate (AUGMENTIN ) 875-125 MG tablet Take 1 tablet by mouth 2 (two) times daily for 7 days. 07/01/24 07/08/24 Yes Vonna Sharlet POUR, MD  bictegravir-emtricitabine -tenofovir  AF (BIKTARVY ) 50-200-25 MG TABS tablet  Take 1 tablet by mouth daily. 02/19/24  Yes Comer, Lamar ORN, MD  HYDROcodone -acetaminophen  (NORCO/VICODIN) 5-325 MG tablet Take 1 tablet by mouth every 6 (six) hours as needed (pain). 07/01/24  Yes Vonna Sharlet POUR, MD  aspirin  EC 81 MG tablet  Take 1 tablet (81 mg total) by mouth daily. Swallow whole. 04/14/24   Harrie Bruckner, DO  losartan  (COZAAR ) 100 MG tablet Take 1 tablet (100 mg total) by mouth daily. 04/29/24   Darryle Thom CROME, PA-C  metoprolol  tartrate (LOPRESSOR ) 25 MG tablet Take 1 tablet (25 mg total) by mouth 2 (two) times daily. 08/03/23   Nahser, Aleene PARAS, MD  nitroGLYCERIN  (NITROSTAT ) 0.4 MG SL tablet Place 1 tablet (0.4 mg total) under the tongue every 5 (five) minutes as needed for chest pain. 08/03/23   Nahser, Aleene PARAS, MD  prasugrel  (EFFIENT ) 10 MG TABS tablet Take 1 tablet (10 mg total) by mouth daily. 04/14/24   Harrie Bruckner, DO  rosuvastatin  (CRESTOR ) 40 MG tablet Take 1 tablet (40 mg total) by mouth daily. 04/14/24   Harrie Bruckner, DO  famotidine  (PEPCID ) 20 MG tablet Take 1 tablet (20 mg total) by mouth 2 (two) times daily. To protect stomach 10/11/20 12/25/20  Fulp, Lannie, MD    Family History Family History  Problem Relation Age of Onset   Dementia Mother    Diabetes Father     Social History Social History   Tobacco Use   Smoking status: Every Day    Current packs/day: 0.50    Average packs/day: 0.5 packs/day for 30.0 years (15.0 ttl pk-yrs)    Types: Cigarettes   Smokeless tobacco: Never  Vaping Use   Vaping status: Never Used  Substance Use Topics   Alcohol use: Yes    Alcohol/week: 1.0 standard drink of alcohol    Types: 1 Cans of beer per week   Drug use: No     Allergies   Patient has no known allergies.   Review of Systems Review of Systems   Physical Exam Triage Vital Signs ED Triage Vitals  Encounter Vitals Group     BP 07/01/24 1333 111/81     Girls Systolic BP Percentile --      Girls Diastolic BP Percentile --      Boys Systolic BP Percentile --      Boys Diastolic BP Percentile --      Pulse Rate 07/01/24 1333 (!) 125     Resp 07/01/24 1333 18     Temp 07/01/24 1333 99.2 F (37.3 C)     Temp Source 07/01/24 1333 Oral     SpO2 07/01/24 1333 95 %      Weight --      Height 07/01/24 1333 5' 8 (1.727 m)     Head Circumference --      Peak Flow --      Pain Score 07/01/24 1331 10     Pain Loc --      Pain Education --      Exclude from Growth Chart --    No data found.  Updated Vital Signs BP 111/81 (BP Location: Left Arm)   Pulse (!) 125   Temp 99.2 F (37.3 C) (Oral)   Resp 18   Ht 5' 8 (1.727 m)   SpO2 95%   BMI 26.00 kg/m   Visual Acuity Right Eye Distance:   Left Eye Distance:   Bilateral Distance:    Right Eye Near:   Left Eye Near:  Bilateral Near:     Physical Exam Vitals reviewed.  Constitutional:      General: He is not in acute distress.    Appearance: He is not toxic-appearing.  Skin:    Coloration: Skin is not pale.     Comments: There is induration about 6 cm in diameter on the right chest mostly underlying the right nipple.  It is erythematous and tender.  There is an area of fluctuance about 2 cm in diameter just medial to the right areola.  Neurological:     General: No focal deficit present.     Mental Status: He is alert and oriented to person, place, and time.  Psychiatric:        Behavior: Behavior normal.      UC Treatments / Results  Labs (all labs ordered are listed, but only abnormal results are displayed) Labs Reviewed - No data to display  EKG   Radiology No results found.  Procedures Procedures (including critical care time)  Medications Ordered in UC Medications  HYDROcodone -acetaminophen  (NORCO/VICODIN) 5-325 MG per tablet 1 tablet (has no administration in time range)  lidocaine -EPINEPHrine -tetracaine (LET) topical gel (3 mLs Topical Given by Other 07/01/24 1421)    Initial Impression / Assessment and Plan / UC Course  I have reviewed the triage vital signs and the nursing notes.  Pertinent labs & imaging results that were available during my care of the patient were reviewed by me and considered in my medical decision making (see chart for details).     We  discussed risks and benefits of incision and drainage and he gives verbal consent.  First let was applied to the fluctuant area just medial to his right nipple.  He had it on for about 30 minutes.  Under clean conditions 1% lidocaine  with epinephrine  was injected in the fluctuant area.  #11 blade is used to make a stab wound in the abscess wall and approximately 4 mL of purulent drainage was expelled from the abscess cavity.  Dressing is applied  EBL 0  Complications none.  Wound care is explained  Augmentin  is sent in for the infection and hydrocodone  is sent in for pain relief.  He takes Biktarvy  and therefore should not take anti-inflammatories.  Hydrocodone  doses given here; he is not driving Final Clinical Impressions(s) / UC Diagnoses   Final diagnoses:  Abscess     Discharge Instructions      2 times daily, wash the wound with soapy water.  Also warm compresses to the area can increase circulation and help the medicine work better.  Take amoxicillin -clavulanate 875 mg--1 tab twice daily with food for 7 days  Hydrocodone  5 mg/acetaminophen  325 mg---1 tablet every 6 hours as needed for pain.  This is best taken with food.  It can cause sleepiness or dizziness.  We gave you 1 dose of this medication here in the clinic.      ED Prescriptions     Medication Sig Dispense Auth. Provider   amoxicillin -clavulanate (AUGMENTIN ) 875-125 MG tablet Take 1 tablet by mouth 2 (two) times daily for 7 days. 14 tablet Naidelyn Parrella K, MD   HYDROcodone -acetaminophen  (NORCO/VICODIN) 5-325 MG tablet Take 1 tablet by mouth every 6 (six) hours as needed (pain). 12 tablet Zaidee Rion K, MD      I have reviewed the PDMP during this encounter.   Vonna Sharlet POUR, MD 07/01/24 [id number]

## 2024-07-01 NOTE — Discharge Instructions (Addendum)
 2 times daily, wash the wound with soapy water.  Also warm compresses to the area can increase circulation and help the medicine work better.  Take amoxicillin -clavulanate 875 mg--1 tab twice daily with food for 7 days  Hydrocodone  5 mg/acetaminophen  325 mg---1 tablet every 6 hours as needed for pain.  This is best taken with food.  It can cause sleepiness or dizziness.  We gave you 1 dose of this medication here in the clinic.

## 2024-07-08 NOTE — Progress Notes (Signed)
 Cardiology Office Note:  .   Date:  07/11/2024  ID:  Gregory Grant, DOB 1966/01/11, MRN [id number] PCP: Freddrick, No  [facility] Providers Cardiologist:  Aleene Passe, MD (Inactive)  History of Present Illness: .   Gregory Grant is a 58 y.o. male with history of inferior STEMI April 2025, inferior STEMI in 2020 with DES to proximal/mid RCA, hypertension, hyperlipidemia, nicotine dependence, alcohol use, HIV, PAD s/p stenting of left SFA in 04/2024      Inferior STEMI First occurrence in 2020 with DES to proximal/mid RCA Second occurrence in April 2025, DES to Lcx.  Was not taking any cardiac medications PTA. EF 55 to 60%, aortic sclerosis.05/2024  Social history  Engaged, children live up in [place]  Smoking 1 black and mild, buying the smaller bottles of liquor.  Some improvement. Loves to walk, has been walking 2+ miles each day.  Not functionally limited whatsoever       Patient with history of 2 inferior STEMI's with the most recent being April 2025 with DES to LCx.  He was last seen May 2025 and doing very well without any complaints and compliant with all of his medications.  Still reported drinking 1 bottle of RNR liquor on the weekends and smoking 2 black milds daily.  He was trying to quit both of these, reported recent deaths in his family has been exacerbated.  Losartan  was increased from 50 to 100 mg.  Today patient presents for follow-up, he reports that he has not been taking any of his medications for the past month.  Simply went to the pharmacy, reportedly did not have any refills and he never informed anybody.  Not having issues affording them.  But has not had any complaints.  Still drinking and smoking but cutting back a little bit.  He thinks his blood pressure has been good at home but not really sure if he is taking this.  BP well-controlled today though.  Continues to be very active.  ROS: Denies: Chest pain, shortness of breath, orthopnea, peripheral  edema, palpitations, decreased exercise intolerance, fatigue, lightheadedness.   Studies Reviewed: .         Risk Assessment/Calculations:             Physical Exam:   VS:  BP 110/70 (BP Location: Left Arm, Patient Position: Sitting, Cuff Size: Normal)   Pulse 77   Ht 5' 8 (1.727 m)   Wt 175 lb (79.4 kg)   SpO2 98%   BMI 26.61 kg/m    Wt Readings from Last 3 Encounters:  07/11/24 175 lb (79.4 kg)  05/13/24 171 lb (77.6 kg)  04/29/24 176 lb 12.8 oz (80.2 kg)    GEN: Well nourished, well developed in no acute distress NECK: No JVD; No carotid bruits CARDIAC: RRR, no murmurs, rubs, gallops RESPIRATORY:  Clear to auscultation without rales, wheezing or rhonchi  ABDOMEN: Soft, non-tender, non-distended EXTREMITIES:  No edema; No deformity   ASSESSMENT AND PLAN: .    CAD Inferior STEMI x 2 - STEMI 2020 with DES to proximal/mid RCA.   - STEMI April 2025 DES to mid to distal LCx.   No anginal complaints.  Has been off all of his medications, said he did not have any refills and did not tell anybody.  Reiterated the importance of his DAPT and need for 100% compliancy with all of his cardiac medications. Continue DAPT with aspirin  and Effient  till at least April 2026 Continue rosuvastatin  40 mg.  Lopressor  25  mg twice daily Echocardiogram 05/2024 with preserved biventricular function.  Mild LVH.  No significant valvular disease.   Does not need his nitroglycerin .  Aortic root dilatation Mild dilatation of aortic root 42 mm.  Ascending aorta 39 mm.  Can follow this annually.   Hypertension Mildly elevated blood pressure last time but today is 110/70.  Has been off of his losartan  for a month.  Reports home readings are the same but not sure if he is actually taking. For now okay to discontinue losartan .  Asked him to be more diligent about checking his blood pressure and to update me if these are elevated.  Hyperlipidemia LDL 118 April 2025. Continue with rosuvastatin  40 mg.   Repeat LDL when he is fasting next, likely will need to check repeat in another 6 to 8 weeks to assess efficacy since he has been noncompliant.  Alcohol use Nicotine dependence Continues to make small progress.  Smoking 1 black a mile daily.  He drinks on the weekend but buys a smaller bottle now.  He has been determined to quit on his own and does not want any resources at this time.   HIV He is on Biktarvy    PAD Followed by vascular surgery.  Status post tenting to the left SFA 04/2024.  Patent stent 05/2024.    Cardiac Rehabilitation Eligibility Assessment  The patient is ready to start cardiac rehabilitation from a cardiac standpoint.       Dispo: 1 month follow-up to assess compliancy.    Signed, Thom LITTIE Sluder, PA-C

## 2024-07-11 ENCOUNTER — Ambulatory Visit: Attending: Student | Admitting: Cardiology

## 2024-07-11 VITALS — BP 110/70 | HR 77 | Ht 68.0 in | Wt 175.0 lb

## 2024-07-11 DIAGNOSIS — Z79899 Other long term (current) drug therapy: Secondary | ICD-10-CM | POA: Diagnosis present

## 2024-07-11 DIAGNOSIS — E785 Hyperlipidemia, unspecified: Secondary | ICD-10-CM | POA: Diagnosis present

## 2024-07-11 DIAGNOSIS — B20 Human immunodeficiency virus [HIV] disease: Secondary | ICD-10-CM | POA: Insufficient documentation

## 2024-07-11 DIAGNOSIS — F1090 Alcohol use, unspecified, uncomplicated: Secondary | ICD-10-CM | POA: Diagnosis present

## 2024-07-11 DIAGNOSIS — I10 Essential (primary) hypertension: Secondary | ICD-10-CM | POA: Diagnosis not present

## 2024-07-11 DIAGNOSIS — I77810 Thoracic aortic ectasia: Secondary | ICD-10-CM | POA: Insufficient documentation

## 2024-07-11 DIAGNOSIS — F17200 Nicotine dependence, unspecified, uncomplicated: Secondary | ICD-10-CM | POA: Diagnosis present

## 2024-07-11 DIAGNOSIS — I25110 Atherosclerotic heart disease of native coronary artery with unstable angina pectoris: Secondary | ICD-10-CM | POA: Diagnosis not present

## 2024-07-11 MED ORDER — NITROGLYCERIN 0.4 MG SL SUBL: 0.4000 mg | SUBLINGUAL_TABLET | SUBLINGUAL | 4 refills | Status: AC | PRN

## 2024-07-11 MED ORDER — PRASUGREL HCL 10 MG PO TABS: 10.0000 mg | ORAL_TABLET | Freq: Every day | ORAL | 3 refills | Status: AC

## 2024-07-11 MED ORDER — ASPIRIN 81 MG PO TBEC: 81.0000 mg | DELAYED_RELEASE_TABLET | Freq: Every day | ORAL | 12 refills | Status: AC

## 2024-07-11 MED ORDER — METOPROLOL TARTRATE 25 MG PO TABS: 25.0000 mg | ORAL_TABLET | Freq: Two times a day (BID) | ORAL | 3 refills | Status: DC

## 2024-07-11 MED ORDER — ROSUVASTATIN CALCIUM 40 MG PO TABS: 40.0000 mg | ORAL_TABLET | Freq: Every day | ORAL | 3 refills | Status: AC

## 2024-07-11 NOTE — Patient Instructions (Signed)
 Medication Instructions:  Your refills have been sent to your pharmacy  *If you need a refill on your cardiac medications before your next appointment, please call your pharmacy*   Lab Work: Return in 1 week for fasting labs................SABRA LIPID PANEL If you have labs (blood work) drawn today and your tests are completely normal, you will receive your results only by: MyChart Message (if you have MyChart) OR A paper copy in the mail If you have any lab test that is abnormal or we need to change your treatment, we will call you to review the results.   Testing/Procedures: No procedures were ordered during today's visit.    Follow-Up: At [facility], you and your health needs are our priority.  As part of our continuing mission to provide you with exceptional heart care, we have created designated Provider Care Teams.  These Care Teams include your primary Cardiologist (physician) and Advanced Practice Providers (APPs -  Physician Assistants and Nurse Practitioners) who all work together to provide you with the care you need, when you need it.  We recommend signing up for the patient portal called MyChart.  Sign up information is provided on this After Visit Summary.  MyChart is used to connect with patients for Virtual Visits (Telemedicine).  Patients are able to view lab/test results, encounter notes, upcoming appointments, etc.  Non-urgent messages can be sent to your provider as well.   To learn more about what you can do with MyChart, go to [email or web address].       Other Instructions Thank you for choosing [facility]!

## 2024-07-11 NOTE — Progress Notes (Unsigned)
 [facility] [place]  ASSESSMENT / PLAN: Gregory Grant is a 58 y.o. male with atherosclerosis of native arteries of left lower extremity causing rest pain  Recommend:  Abstinence from all tobacco products. Blood glucose control with goal A1c < 7%. Blood pressure control with goal blood pressure < 140/90 mmHg. Lipid reduction therapy with goal LDL-C <100 mg/dL  Aspirin  81mg  PO QD.  Atorvastatin  40-80mg  PO QD (or other high intensity statin therapy). Daily walking to and past the point of discomfort. Patient counseled to keep a log of exercise distance.  Patient symptoms have progressed to the left.  He is in early rest pain.  He suffered a recent STEMI.  I am hopeful that he will have a endovascular solution given his recent myocardial infarction.  If he needs open surgery, he will need cardiac evaluation prior.  CHIEF COMPLAINT: right leg pain with walking  HISTORY OF PRESENT ILLNESS: Gregory Grant is a 58 y.o. male who presents to clinic for evaluation of pain in the right leg with walking.  The patient gives a fairly classic history of claudication.  Reports cramping discomfort begins in his calf at about a block.  The pain continues until he rests.  The pain is limiting his ability to do the things he likes to do, but does not limit him from doing necessary things like grocery shopping.  He does not describe rest pain.  He has no ulcers about his feet.  He has well-controlled HIV disease and currently has an undetectable viral load.  04/26/2024.  Patient returns to office for follow-up of lower extremity symptoms.  He suffered a STEMI 04/12/2024 and underwent percutaneous intervention with Dr. Anner the same evening.  Good technical result was achieved with left circumflex stenting.  He has cut back on smoking significantly, but still smokes about 1 cigar a day.  Patient's symptoms in the lower extremity have progressed.  He is barely able to walk.  He reports  pain in the leg that wakes him up from sleep at night.  VASCULAR SURGICAL HISTORY: none  VASCULAR RISK FACTORS: Negative history of stroke / transient ischemic attack. Positive history of coronary artery disease. + history of PCI.  Negative history of diabetes mellitus.  Positive history of smoking. + actively smoking. Negative history of hypertension.  Negative history of chronic kidney disease.   Negative history of chronic obstructive pulmonary disease.  FUNCTIONAL STATUS: ECOG performance status: (1) Restricted in physically strenuous activity, ambulatory and able to do work of light nature Ambulatory status: Ambulatory within the community with limits  CAREY 1 AND 3 YEAR INDEX Male (2pts) 75-79 or 80-84 (2pts) >84 (3pts) Dependence in toileting (1pt) Partial or full dependence in dressing (1pt) History of malignant neoplasm (2pts) CHF (3pts) COPD (1pts) CKD (3pts)  0-3 pts 6% 1 year mortality ; 21% 3 year mortality 4-5 pts 12% 1 year mortality ; 36% 3 year mortality >5 pts 21% 1 year mortality; 54% 3 year mortality   Past Medical History:  Diagnosis Date   Coughing 02/2015   GERD (gastroesophageal reflux disease)    HIV (human immunodeficiency virus infection) (HCC)    Peripheral arterial disease (HCC)     Past Surgical History:  Procedure Laterality Date   ABDOMINAL AORTOGRAM N/A 05/13/2024   Procedure: ABDOMINAL AORTOGRAM;  Surgeon: Magda Debby SAILOR, MD;  Location: [facility] INVASIVE CV LAB;  Service: Cardiovascular;  Laterality: N/A;   CORONARY STENT INTERVENTION N/A 06/24/2019   Procedure: CORONARY STENT INTERVENTION;  Surgeon:  Anner Alm ORN, MD;  Location: [facility] INVASIVE CV LAB;  Service: Cardiovascular;  Laterality: N/A;   CORONARY/GRAFT ACUTE MI REVASCULARIZATION N/A 06/24/2019   Procedure: Coronary/Graft Acute MI Revascularization;  Surgeon: Anner Alm ORN, MD;  Location[facility] INVASIVE CV LAB;  Service: Cardiovascular;  Laterality: N/A;   CORONARY/GRAFT ACUTE MI  REVASCULARIZATION N/A 04/12/2024   Procedure: Coronary/Graft Acute MI Revascularization;  Surgeon: Elmira Newman PARAS, MD;  Location: [facility] INVASIVE CV LAB;  Service: Cardiovascular;  Laterality: N/A;   LEFT HEART CATH AND CORONARY ANGIOGRAPHY N/A 06/24/2019   Procedure: LEFT HEART CATH AND CORONARY ANGIOGRAPHY;  Surgeon: Anner Alm ORN, MD;  Location: [facility] INVASIVE CV LAB;  Service: Cardiovascular;  Laterality: N/A;   LEFT HEART CATH AND CORONARY ANGIOGRAPHY N/A 04/12/2024   Procedure: LEFT HEART CATH AND CORONARY ANGIOGRAPHY;  Surgeon: Elmira Newman PARAS, MD;  Location: [facility] INVASIVE CV LAB;  Service: Cardiovascular;  Laterality: N/A;   LOWER EXTREMITY ANGIOGRAPHY Left 05/13/2024   Procedure: Lower Extremity Angiography;  Surgeon: Magda Debby SAILOR, MD;  Location: [facility] INVASIVE CV LAB;  Service: Cardiovascular;  Laterality: Left;   LOWER EXTREMITY INTERVENTION Left 05/13/2024   Procedure: LOWER EXTREMITY INTERVENTION;  Surgeon: Magda Debby SAILOR, MD;  Location: [facility] INVASIVE CV LAB;  Service: Cardiovascular;  Laterality: Left;    Family History  Problem Relation Age of Onset   Dementia Mother    Diabetes Father     Social History   Socioeconomic History   Marital status: Legally Separated    Spouse name: Not on file   Number of children: Not on file   Years of education: Not on file   Highest education level: Not on file  Occupational History   Not on file  Tobacco Use   Smoking status: Every Day    Current packs/day: 0.50    Average packs/day: 0.5 packs/day for 30.0 years (15.0 ttl pk-yrs)    Types: Cigarettes   Smokeless tobacco: Never  Vaping Use   Vaping status: Never Used  Substance and Sexual Activity   Alcohol use: Yes    Alcohol/week: 1.0 standard drink of alcohol    Types: 1 Cans of beer per week   Drug use: No   Sexual activity: Yes    Comment: declined condoms  Other Topics Concern   Not on file  Social History Narrative   Not on file   Social Drivers of Health    Financial Resource Strain: Not on file  Food Insecurity: Not on file  Transportation Needs: Not on file  Physical Activity: Not on file  Stress: Not on file  Social Connections: Not on file  Intimate Partner Violence: Not on file    No Known Allergies  Current Outpatient Medications  Medication Sig Dispense Refill   amoxicillin -clavulanate (AUGMENTIN ) 875-125 MG tablet Take 1 tablet by mouth 2 (two) times daily for 7 days. (Patient not taking: Reported on 07/11/2024) 14 tablet 0   aspirin  EC 81 MG tablet Take 1 tablet (81 mg total) by mouth daily. Swallow whole. (Patient not taking: Reported on 07/11/2024) 30 tablet 12   bictegravir-emtricitabine -tenofovir  AF (BIKTARVY ) 50-200-25 MG TABS tablet Take 1 tablet by mouth daily. 30 tablet 11   HYDROcodone -acetaminophen  (NORCO/VICODIN) 5-325 MG tablet Take 1 tablet by mouth every 6 (six) hours as needed (pain). (Patient not taking: Reported on 07/11/2024) 12 tablet 0   losartan  (COZAAR ) 100 MG tablet Take 1 tablet (100 mg total) by mouth daily. (Patient not taking: Reported on 07/11/2024) 30 tablet 6   [Paused]  metoprolol  tartrate (LOPRESSOR ) 25 MG tablet Take 1 tablet (25 mg total) by mouth 2 (two) times daily. (Patient not taking: Reported on 07/11/2024) 180 tablet 2   nitroGLYCERIN  (NITROSTAT ) 0.4 MG SL tablet Place 1 tablet (0.4 mg total) under the tongue every 5 (five) minutes as needed for chest pain. 25 tablet 4   prasugrel  (EFFIENT ) 10 MG TABS tablet Take 1 tablet (10 mg total) by mouth daily. 30 tablet 3   rosuvastatin  (CRESTOR ) 40 MG tablet Take 1 tablet (40 mg total) by mouth daily. (Patient not taking: Reported on 07/11/2024) 30 tablet 3   No current facility-administered medications for this visit.    PHYSICAL EXAM There were no vitals filed for this visit.    Chronically ill appearing. No distress Regular rate and rhythm Unlabored breathing No palpable pedal pulses Palpable right femoral pulse  PERTINENT LABORATORY AND  RADIOLOGIC DATA  Most recent CBC    Latest Ref Rng & Units 05/13/2024    3:31 PM 05/13/2024    7:07 AM 04/13/2024    4:40 AM  CBC  WBC 4.0 - 10.5 K/uL 8.5   6.8   Hemoglobin 13.0 - 17.0 g/dL 85.0  84.6  84.2   Hematocrit 39.0 - 52.0 % 44.0  45.0  44.7   Platelets 150 - 400 K/uL 162   127      Most recent CMP    Latest Ref Rng & Units 05/13/2024    3:31 PM 05/13/2024    7:07 AM 04/12/2024    6:16 AM  CMP  Glucose 70 - 99 mg/dL 893  96  883   BUN 6 - 20 mg/dL 6  8  7    Creatinine 0.61 - 1.24 mg/dL 8.86  8.89  8.99   Sodium 135 - 145 mmol/L 138  141  140   Potassium 3.5 - 5.1 mmol/L 3.9  3.9  3.6   Chloride 98 - 111 mmol/L 105  104  106   CO2 22 - 32 mmol/L 23     Calcium  8.9 - 10.3 mg/dL 9.4       Renal function CrCl cannot be calculated (Patient's most recent lab result is older than the maximum 21 days allowed.).  Hgb A1c MFr Bld (%)  Date Value  04/12/2024 5.4    LDL Cholesterol (Calc)  Date Value Ref Range Status  09/11/2022 119 (H) mg/dL (calc) Final    Comment:    Reference range: <100 . Desirable range <100 mg/dL for primary prevention;   <70 mg/dL for patients with CHD or diabetic patients  with > or = 2 CHD risk factors. SABRA LDL-C is now calculated using the Martin-Hopkins  calculation, which is a validated novel method providing  better accuracy than the Friedewald equation in the  estimation of LDL-C.  Gladis APPLETHWAITE et al. SANDREA. 7986;689(80): 2061-2068  (http://education.[employer].com/faq/FAQ164)    LDL Chol Calc (NIH)  Date Value Ref Range Status  11/03/2023 82 0 - 99 mg/dL Final   LDL Cholesterol  Date Value Ref Range Status  04/12/2024 118 (H) 0 - 99 mg/dL Final    Comment:           Total Cholesterol/HDL:CHD Risk Coronary Heart Disease Risk Table                     Men   Women  1/2 Average Risk   3.4   3.3  Average Risk       5.0   4.4  2 X  Average Risk   9.6   7.1  3 X Average Risk  23.4   11.0        Use the calculated Patient  Ratio above and the CHD Risk Table to determine the patient's CHD Risk.        ATP III CLASSIFICATION (LDL):  <100     mg/dL   Optimal  899-870  mg/dL   Near or Above                    Optimal  130-159  mg/dL   Borderline  839-810  mg/dL   High  >809     mg/dL   Very High Performed at [facility], [address] [place], [place] [address]      +-------+-----------+-----------+------------+------------+  ABI/TBIToday's ABIToday's TBIPrevious ABIPrevious TBI  +-------+-----------+-----------+------------+------------+  Right .77        .48        .70         .43           +-------+-----------+-----------+------------+------------+  Left  .94        .40        .46         0.0           +-------+-----------+-----------+------------+------------+   Arterial duplex shows widely patent SFA stenting without stenosis   Debby SAILOR. Magda, MD FACS [facility] Phone Number: [phone] 07/11/2024 10:58 AM   Total time spent on preparing this encounter including chart review, data review, collecting history, examining the patient, coordinating care for this established patient, 40 minutes  Portions of this report may have been transcribed using voice recognition software.  Every effort has been made to ensure accuracy; however, inadvertent computerized transcription errors may still be present.

## 2024-07-12 ENCOUNTER — Ambulatory Visit: Attending: Vascular Surgery | Admitting: Vascular Surgery

## 2024-07-12 ENCOUNTER — Encounter: Payer: Self-pay | Admitting: Vascular Surgery

## 2024-07-12 VITALS — BP 164/95 | HR 62 | Temp 98.0°F | Ht 68.0 in | Wt 176.0 lb

## 2024-07-12 DIAGNOSIS — I739 Peripheral vascular disease, unspecified: Secondary | ICD-10-CM | POA: Insufficient documentation

## 2024-07-13 ENCOUNTER — Other Ambulatory Visit: Payer: Self-pay | Admitting: *Deleted

## 2024-07-13 DIAGNOSIS — I739 Peripheral vascular disease, unspecified: Secondary | ICD-10-CM

## 2024-07-13 DIAGNOSIS — I70211 Atherosclerosis of native arteries of extremities with intermittent claudication, right leg: Secondary | ICD-10-CM

## 2024-07-15 ENCOUNTER — Telehealth (HOSPITAL_COMMUNITY): Payer: Self-pay

## 2024-07-15 ENCOUNTER — Encounter (HOSPITAL_COMMUNITY): Payer: Self-pay

## 2024-07-15 NOTE — Telephone Encounter (Signed)
 Pt insurance is active and benefits verified through [facility]. Co-pay $4, DED $0/$0 met, out of pocket $0/$0 met, co-insurance 0%. No pre-authorization required. 07/15/2024 @ 9:21am, spoke with Chesley, REF# [id number].  How many CR sessions are covered? (36 visits for TCR, 72 visits for ICR)36 TCR Is this a lifetime maximum or an annual maximum? Annual Has the member used any of these services to date? No Is there a time limit (weeks/months) on start of program and/or program completion? 6 months from event on 4/26

## 2024-07-29 ENCOUNTER — Telehealth (HOSPITAL_COMMUNITY): Payer: Self-pay

## 2024-07-29 NOTE — Telephone Encounter (Signed)
 Attempted f/u call regarding cardiac rehab- no answer, left message. Sent MyChart message.  Closing referral.

## 2024-08-05 ENCOUNTER — Other Ambulatory Visit: Payer: Self-pay

## 2024-08-05 DIAGNOSIS — Z113 Encounter for screening for infections with a predominantly sexual mode of transmission: Secondary | ICD-10-CM

## 2024-08-05 DIAGNOSIS — B20 Human immunodeficiency virus [HIV] disease: Secondary | ICD-10-CM

## 2024-08-05 DIAGNOSIS — Z79899 Other long term (current) drug therapy: Secondary | ICD-10-CM

## 2024-08-09 ENCOUNTER — Other Ambulatory Visit

## 2024-08-09 ENCOUNTER — Other Ambulatory Visit: Payer: Self-pay

## 2024-08-09 DIAGNOSIS — Z113 Encounter for screening for infections with a predominantly sexual mode of transmission: Secondary | ICD-10-CM

## 2024-08-09 DIAGNOSIS — Z79899 Other long term (current) drug therapy: Secondary | ICD-10-CM

## 2024-08-09 DIAGNOSIS — B20 Human immunodeficiency virus [HIV] disease: Secondary | ICD-10-CM

## 2024-08-09 NOTE — Progress Notes (Unsigned)
 Cardiology Office Note:  .   Date:  08/10/2024  ID:  Gregory Grant, DOB [birth year], MRN [id number] PCP: Freddrick, No  [facility] Providers Cardiologist:  Aleene Passe, MD (Inactive) {  History of Present Illness: .   Gregory Grant is a 58 y.o. male with history of inferior STEMI April 2025, inferior STEMI in 2020 with DES to proximal/mid RCA, hypertension, hyperlipidemia, nicotine dependence, alcohol use, HIV, PAD s/p stenting of left SFA in 04/2024      Inferior STEMI First occurrence in 2020 with DES to proximal/mid RCA Second occurrence in April 2025, DES to Lcx.  Was not taking any cardiac medications PTA. EF 55 to 60%, aortic sclerosis.05/2024   Social history  Engaged, children live up in [place]  Smoking 1 black and mild, buying the smaller bottles of liquor.  Some improvement. Loves to walk, has been walking 2+ miles each day.  Not functionally limited whatsoever      Patient with history of 2 inferior STEMI's with the most recent being April 2025 with DES to LCx.  I have been seeing patient for the last 2 months or so to follow-up on alcohol use, compliancy, smoking.  At our last office visit 7/28 he had not been taking any of his medications for the past month simply because he did not have any refills.  I provided all refills on all of his meds last visit.  Losartan  discontinued since he had good blood pressure while off of this.  He was showing some small improvements in drinking and smoking.  I also encouraged him to get BP cuff.  He had otherwise no acute complaints.  Today patient presents for follow-up.  He is back on all of his medications and has been compliant with all of this and takes them at the same time every day and does not miss.  Still has not gotten the BP cuff.  Has no other acute complaints.  Still smoking about 1 pack black and mild today but drinking even less.  Continues to be active.  ROS: Denies: Chest pain, shortness of breath, orthopnea,  peripheral edema, palpitations, decreased exercise intolerance, fatigue, lightheadedness.   Studies Reviewed: .         Risk Assessment/Calculations:         Physical Exam:   VS:  BP (!) 140/80   Pulse 72   Ht 5' 8 (1.727 m)   Wt 175 lb 6.4 oz (79.6 kg)   SpO2 98%   BMI 26.67 kg/m    Wt Readings from Last 3 Encounters:  08/10/24 175 lb 6.4 oz (79.6 kg)  07/12/24 176 lb (79.8 kg)  07/11/24 175 lb (79.4 kg)    GEN: Well nourished, well developed in no acute distress NECK: No JVD; No carotid bruits CARDIAC: RRR, no murmurs, rubs, gallops RESPIRATORY:  Clear to auscultation without rales, wheezing or rhonchi  ABDOMEN: Soft, non-tender, non-distended EXTREMITIES:  No edema; No deformity   ASSESSMENT AND PLAN: .    CAD Inferior STEMI x 2 - STEMI 2020 with DES to proximal/mid RCA.   - STEMI 03/2024 DES to mid to distal LCx.   - Echocardiogram 05/2024 with preserved biventricular function.  Mild LVH.  No significant valvular disease.   Stable disease with no complaints.  Back on all of his meds now. Continue DAPT with aspirin  and Effient  till at least April 2026 Continue rosuvastatin  40 mg.  Start carvedilol  6.25 mg twice daily, DC Lopressor . Does not need his nitroglycerin .  Currently  with no contraindications to Viagra as long as he does not take it with nitroglycerin  (he asked about this). Does not want to do cardiac rehab.   Aortic root dilatation Mild dilatation of aortic root 42 mm.  Ascending aorta 39 mm.  Can follow this annually. Next study consider CT to further define measurements.    Hypertension Blood pressure elevated intermittently.  Today 142/84. For simplicity/compliancy we will just stop his Lopressor  and start carvedilol  6.25 mg twice daily. I have asked him again to get blood pressure cuff, he says he will do at this time.  Log blood pressure and send 1 week of measurements.   Hyperlipidemia Lipid panel yesterday showed LDL of 67 with good response to  statin therapy.  Continue with rosuvastatin  40 mg.  Start Zetia  10 mg   Alcohol use Nicotine dependence Continues to make small progress.  Still smoking 1 black and mild daily.  Still drinking but less and less each time I see him.  He has been determined to quit on his own and does not want any resources at this time.   HIV He is on Biktarvy    PAD Followed by vascular surgery.  Status post stenting to the left SFA 04/2024.  Patent stent 05/2024.     Dispo: Follow-up with Dr. Elmira in 4 months establish care and to further assess compliancy and blood pressure.  Signed, Thom LITTIE Sluder, PA-C

## 2024-08-10 ENCOUNTER — Ambulatory Visit: Attending: Nurse Practitioner | Admitting: Cardiology

## 2024-08-10 ENCOUNTER — Encounter: Payer: Self-pay | Admitting: Nurse Practitioner

## 2024-08-10 VITALS — BP 140/80 | HR 72 | Ht 68.0 in | Wt 175.4 lb

## 2024-08-10 DIAGNOSIS — F1090 Alcohol use, unspecified, uncomplicated: Secondary | ICD-10-CM | POA: Insufficient documentation

## 2024-08-10 DIAGNOSIS — F17200 Nicotine dependence, unspecified, uncomplicated: Secondary | ICD-10-CM | POA: Insufficient documentation

## 2024-08-10 DIAGNOSIS — I77810 Thoracic aortic ectasia: Secondary | ICD-10-CM | POA: Diagnosis present

## 2024-08-10 DIAGNOSIS — I10 Essential (primary) hypertension: Secondary | ICD-10-CM | POA: Diagnosis not present

## 2024-08-10 DIAGNOSIS — E785 Hyperlipidemia, unspecified: Secondary | ICD-10-CM | POA: Insufficient documentation

## 2024-08-10 DIAGNOSIS — I25110 Atherosclerotic heart disease of native coronary artery with unstable angina pectoris: Secondary | ICD-10-CM | POA: Diagnosis not present

## 2024-08-10 LAB — T-HELPER CELL (CD4) - (RCID CLINIC ONLY)
CD4 % Helper T Cell: 56 % (ref 33–65)
CD4 T Cell Abs: 1547 /uL (ref 400–1790)

## 2024-08-10 MED ORDER — EZETIMIBE 10 MG PO TABS: 10.0000 mg | ORAL_TABLET | Freq: Every day | ORAL | 3 refills | Status: AC

## 2024-08-10 MED ORDER — CARVEDILOL 6.25 MG PO TABS: 6.2500 mg | ORAL_TABLET | Freq: Two times a day (BID) | ORAL | 3 refills | Status: AC

## 2024-08-10 NOTE — Patient Instructions (Addendum)
 Medication Instructions:  Start Zetia  10 mg daily Start Carvedilol  6.25 mg twice daily Stop Lopressor  25 mg  *If you need a refill on your cardiac medications before your next appointment, please call your pharmacy*  Lab Work: NONE ordered at this time of appointment    Testing/Procedures: NONE ordered at this time of appointment   Follow-Up: At [facility], you and your health needs are our priority.  As part of our continuing mission to provide you with exceptional heart care, our providers are all part of one team.  This team includes your primary Cardiologist (physician) and Advanced Practice Providers or APPs (Physician Assistants and Nurse Practitioners) who all work together to provide you with the care you need, when you need it.  Your next appointment:   4 month(s)  Provider:   Dr. Patwardardan     We recommend signing up for the patient portal called MyChart.  Sign up information is provided on this After Visit Summary.  MyChart is used to connect with patients for Virtual Visits (Telemedicine).  Patients are able to view lab/test results, encounter notes, upcoming appointments, etc.  Non-urgent messages can be sent to your provider as well.   To learn more about what you can do with MyChart, go to [email or web address].   Other Instructions Monitor blood pressure. Report blood pressure to [employer] PA in 1 week.

## 2024-08-11 LAB — COMPLETE METABOLIC PANEL WITHOUT GFR
AG Ratio: 1.5 (calc) (ref 1.0–2.5)
ALT: 12 U/L (ref 9–46)
AST: 16 U/L (ref 10–35)
Albumin: 4.1 g/dL (ref 3.6–5.1)
Alkaline phosphatase (APISO): 77 U/L (ref 35–144)
BUN: 13 mg/dL (ref 7–25)
CO2: 24 mmol/L (ref 20–32)
Calcium: 9.4 mg/dL (ref 8.6–10.3)
Chloride: 106 mmol/L (ref 98–110)
Creat: 1.18 mg/dL (ref 0.70–1.30)
Globulin: 2.8 g/dL (ref 1.9–3.7)
Glucose, Bld: 94 mg/dL (ref 65–99)
Potassium: 4.4 mmol/L (ref 3.5–5.3)
Sodium: 141 mmol/L (ref 135–146)
Total Bilirubin: 0.4 mg/dL (ref 0.2–1.2)
Total Protein: 6.9 g/dL (ref 6.1–8.1)

## 2024-08-11 LAB — CBC WITH DIFFERENTIAL/PLATELET
Absolute Lymphocytes: 2956 {cells}/uL (ref 850–3900)
Absolute Monocytes: 507 {cells}/uL (ref 200–950)
Basophils Absolute: 62 {cells}/uL (ref 0–200)
Basophils Relative: 0.8 %
Eosinophils Absolute: 172 {cells}/uL (ref 15–500)
Eosinophils Relative: 2.2 %
HCT: 46.9 % (ref 38.5–50.0)
Hemoglobin: 15.5 g/dL (ref 13.2–17.1)
MCH: 33.9 pg — ABNORMAL HIGH (ref 27.0–33.0)
MCHC: 33 g/dL (ref 32.0–36.0)
MCV: 102.6 fL — ABNORMAL HIGH (ref 80.0–100.0)
MPV: 10.5 fL (ref 7.5–12.5)
Monocytes Relative: 6.5 %
Neutro Abs: 4103 {cells}/uL (ref 1500–7800)
Neutrophils Relative %: 52.6 %
Platelets: 192 Thousand/uL (ref 140–400)
RBC: 4.57 Million/uL (ref 4.20–5.80)
RDW: 14.5 % (ref 11.0–15.0)
Total Lymphocyte: 37.9 %
WBC: 7.8 Thousand/uL (ref 3.8–10.8)

## 2024-08-11 LAB — LIPID PANEL
Cholesterol: 132 mg/dL (ref <200)
HDL: 48 mg/dL (ref >=40)
LDL Cholesterol (Calc): 67 mg/dL
Non-HDL Cholesterol (Calc): 84 mg/dL (ref <130)
Total CHOL/HDL Ratio: 2.8 (calc) (ref <5.0)
Triglycerides: 90 mg/dL (ref <150)

## 2024-08-11 LAB — HIV-1 RNA QUANT-NO REFLEX-BLD
HIV 1 RNA Quant: <20 {copies}/mL — AB
HIV-1 RNA Quant, Log: <1.3 {Log_copies}/mL — AB

## 2024-08-11 LAB — RPR: RPR Ser Ql: NONREACTIVE

## 2024-08-23 ENCOUNTER — Encounter: Payer: Self-pay | Admitting: Internal Medicine

## 2024-08-23 ENCOUNTER — Ambulatory Visit: Admitting: Internal Medicine

## 2024-08-23 ENCOUNTER — Other Ambulatory Visit: Payer: Self-pay

## 2024-08-23 ENCOUNTER — Ambulatory Visit: Payer: Self-pay | Admitting: Internal Medicine

## 2024-08-23 VITALS — BP 127/84 | HR 71 | Temp 97.7°F | Ht 68.0 in | Wt 175.0 lb

## 2024-08-23 DIAGNOSIS — Z23 Encounter for immunization: Secondary | ICD-10-CM

## 2024-08-23 DIAGNOSIS — Z113 Encounter for screening for infections with a predominantly sexual mode of transmission: Secondary | ICD-10-CM

## 2024-08-23 DIAGNOSIS — B20 Human immunodeficiency virus [HIV] disease: Secondary | ICD-10-CM

## 2024-08-23 DIAGNOSIS — I2510 Atherosclerotic heart disease of native coronary artery without angina pectoris: Secondary | ICD-10-CM

## 2024-08-23 NOTE — Patient Instructions (Signed)
 Vaccination: Meningitis  Tdap    Continue your biktarvy    Please establish a primary care provider to go through your cancer screening. Also can coordinate with mental health easier   See me in 6 months

## 2024-08-23 NOTE — Addendum Note (Signed)
 Addended by: CELESTIA LELA HERO on: 08/23/2024 11:28 AM   Modules accepted: Orders

## 2024-08-23 NOTE — Progress Notes (Signed)
   Subjective:    Patient ID: Gregory Grant, male    DOB: [birth year], 58 y.o.   MRN: [id number]  HPI Gregory Grant is here for follow-up of HIV. He on Biktarvy  though has had issues with his and insurance coverage and was out of medication for 3 days.  He otherwise is back on it now and getting samples to continue.  Is having no complaints today.  He is attempting to reduce his smoking and is smoking occasional black and milds now.  He otherwise has no complaints today.   08/23/24  Dr Comer's patient My first visit with him today Compliant with biktarvy  no missed dose No complaint Smoking a cigar in clinic He has no health concern He doesn't have a primary care doc, but he has a cardiologist Review his medical problems -- cad hx MI, PAD, prediabetic Smoking -- used to do 2ppd, currently smoking one cigar a day Etoh -- he drinks a beer a day, a hal bottle of liquor during the weekend He does take several cardiac meds, including crestor  40 mg daily  He wants to continue seeing ID clinic every 6 months   Social -- Patient is currently with his fiancee for a year and 6 months as of 08/23/24. His current fiancee doesn't have hiv. (His old fiancee gave him hiv) Patient is not working; waiting to do disability paperwork   Review of Systems  Constitutional:  Negative for fatigue.  Gastrointestinal:  Negative for diarrhea.  Skin:  Negative for rash.       Objective:   Physical Exam Eyes:     General: No scleral icterus. Pulmonary:     Effort: Pulmonary effort is normal.  Neurological:     Mental Status: He is alert.    Labs: Lab Results  Component Value Date   WBC 7.8 08/09/2024   HGB 15.5 08/09/2024   HCT 46.9 08/09/2024   MCV 102.6 (H) 08/09/2024   PLT 192 08/09/2024   Last metabolic panel Lab Results  Component Value Date   GLUCOSE 94 08/09/2024   NA 141 08/09/2024   K 4.4 08/09/2024   CL 106 08/09/2024   CO2 24 08/09/2024   BUN 13 08/09/2024   CREATININE 1.18  08/09/2024   GFRNONAA >60 05/13/2024   CALCIUM  9.4 08/09/2024   PHOS 1.9 (L) 03/15/2015   PROT 6.9 08/09/2024   ALBUMIN 3.5 04/12/2024   BILITOT 0.4 08/09/2024   ALKPHOS 67 04/12/2024   AST 16 08/09/2024   ALT 12 08/09/2024   ANIONGAP 10 05/13/2024   HIV: Lab Results  Component Value Date   HIV1RNAQUANT <20 DETECTED (A) 08/09/2024   Lab Results  Component Value Date   CD4TCELL 56 08/09/2024   CD4TABS 1,547 08/09/2024           Assessment & Plan:   #hiv  Well controlled/complaint on biktarvy   -discussed u=u -encourage compliance -continue current HIV medication -labs reviewed -f/u in 6 months    #cad/pad F/u cardiology    #hcm -vaccination  Due for tdap and meningococcal today Doesn't want to do shingle vaccine -hepatitis 2018 hep b serology negative (passive/active) and he said he doesn't want hep b vaccination -metabolic On crestor  40 mg daily -std Said  he has no risk 07/2024 rpr negative --  never had syphilis -cancer screening Hasn't done colonoscopy or prostate cancer screen

## 2024-09-05 ENCOUNTER — Emergency Department (HOSPITAL_COMMUNITY)

## 2024-09-05 ENCOUNTER — Encounter (HOSPITAL_COMMUNITY): Payer: Self-pay | Admitting: *Deleted

## 2024-09-05 ENCOUNTER — Other Ambulatory Visit: Payer: Self-pay

## 2024-09-05 ENCOUNTER — Observation Stay (HOSPITAL_COMMUNITY)
Admission: EM | Admit: 2024-09-05 | Discharge: 2024-09-06 | Disposition: A | Discharge status: Home / Self Care | Attending: Cardiology | Admitting: Cardiology

## 2024-09-05 DIAGNOSIS — I739 Peripheral vascular disease, unspecified: Secondary | ICD-10-CM | POA: Diagnosis not present

## 2024-09-05 DIAGNOSIS — Z79899 Other long term (current) drug therapy: Secondary | ICD-10-CM | POA: Diagnosis not present

## 2024-09-05 DIAGNOSIS — E785 Hyperlipidemia, unspecified: Secondary | ICD-10-CM | POA: Insufficient documentation

## 2024-09-05 DIAGNOSIS — I2510 Atherosclerotic heart disease of native coronary artery without angina pectoris: Secondary | ICD-10-CM | POA: Insufficient documentation

## 2024-09-05 DIAGNOSIS — I10 Essential (primary) hypertension: Secondary | ICD-10-CM | POA: Diagnosis not present

## 2024-09-05 DIAGNOSIS — R079 Chest pain, unspecified: Secondary | ICD-10-CM | POA: Diagnosis present

## 2024-09-05 DIAGNOSIS — B20 Human immunodeficiency virus [HIV] disease: Secondary | ICD-10-CM | POA: Insufficient documentation

## 2024-09-05 DIAGNOSIS — I214 Non-ST elevation (NSTEMI) myocardial infarction: Principal | ICD-10-CM | POA: Diagnosis present

## 2024-09-05 DIAGNOSIS — F17210 Nicotine dependence, cigarettes, uncomplicated: Secondary | ICD-10-CM | POA: Diagnosis not present

## 2024-09-05 DIAGNOSIS — I160 Hypertensive urgency: Secondary | ICD-10-CM | POA: Diagnosis not present

## 2024-09-05 DIAGNOSIS — Z7982 Long term (current) use of aspirin: Secondary | ICD-10-CM | POA: Diagnosis not present

## 2024-09-05 DIAGNOSIS — I25119 Atherosclerotic heart disease of native coronary artery with unspecified angina pectoris: Secondary | ICD-10-CM | POA: Diagnosis not present

## 2024-09-05 LAB — COMPREHENSIVE METABOLIC PANEL WITH GFR
ALT: 25 U/L (ref 0–44)
AST: 44 U/L — ABNORMAL HIGH (ref 15–41)
Albumin: 3.7 g/dL (ref 3.5–5.0)
Alkaline Phosphatase: 77 U/L (ref 38–126)
Anion gap: 11 (ref 5–15)
BUN: 10 mg/dL (ref 6–20)
CO2: 16 mmol/L — ABNORMAL LOW (ref 22–32)
Calcium: 8.6 mg/dL — ABNORMAL LOW (ref 8.9–10.3)
Chloride: 105 mmol/L (ref 98–111)
Creatinine, Ser: 1.08 mg/dL (ref 0.61–1.24)
GFR, Estimated: >60 mL/min (ref >60)
Glucose, Bld: 93 mg/dL (ref 70–99)
Potassium: 4.3 mmol/L (ref 3.5–5.1)
Sodium: 132 mmol/L — ABNORMAL LOW (ref 135–145)
Total Bilirubin: 0.9 mg/dL (ref 0.0–1.2)
Total Protein: 6.9 g/dL (ref 6.5–8.1)

## 2024-09-05 LAB — I-STAT CHEM 8, ED
BUN: 9 mg/dL (ref 6–20)
Calcium, Ion: 1.06 mmol/L — ABNORMAL LOW (ref 1.15–1.40)
Chloride: 107 mmol/L (ref 98–111)
Creatinine, Ser: 1 mg/dL (ref 0.61–1.24)
Glucose, Bld: 95 mg/dL (ref 70–99)
HCT: 47 % (ref 39.0–52.0)
Hemoglobin: 16 g/dL (ref 13.0–17.0)
Potassium: 4.3 mmol/L (ref 3.5–5.1)
Sodium: 136 mmol/L (ref 135–145)
TCO2: 18 mmol/L — ABNORMAL LOW (ref 22–32)

## 2024-09-05 LAB — CBC WITH DIFFERENTIAL/PLATELET
Abs Immature Granulocytes: 0.04 K/uL (ref 0.00–0.07)
Basophils Absolute: 0 K/uL (ref 0.0–0.1)
Basophils Relative: 0 %
Eosinophils Absolute: 0.2 K/uL (ref 0.0–0.5)
Eosinophils Relative: 2 %
HCT: 46.1 % (ref 39.0–52.0)
Hemoglobin: 15.6 g/dL (ref 13.0–17.0)
Immature Granulocytes: 1 %
Lymphocytes Relative: 33 %
Lymphs Abs: 2.5 K/uL (ref 0.7–4.0)
MCH: 34.1 pg — ABNORMAL HIGH (ref 26.0–34.0)
MCHC: 33.8 g/dL (ref 30.0–36.0)
MCV: 100.9 fL — ABNORMAL HIGH (ref 80.0–100.0)
Monocytes Absolute: 0.6 K/uL (ref 0.1–1.0)
Monocytes Relative: 8 %
Neutro Abs: 4.2 K/uL (ref 1.7–7.7)
Neutrophils Relative %: 56 %
Platelets: 169 K/uL (ref 150–400)
RBC: 4.57 MIL/uL (ref 4.22–5.81)
RDW: 14.2 % (ref 11.5–15.5)
WBC: 7.6 K/uL (ref 4.0–10.5)
nRBC: 0 % (ref 0.0–0.2)

## 2024-09-05 LAB — TROPONIN I (HIGH SENSITIVITY)
Troponin I (High Sensitivity): 1813 ng/L (ref <18)
Troponin I (High Sensitivity): 4380 ng/L (ref <18)

## 2024-09-05 LAB — HEPARIN LEVEL (UNFRACTIONATED): Heparin Unfractionated: 0.26 [IU]/mL — ABNORMAL LOW (ref 0.30–0.70)

## 2024-09-05 MED ORDER — BICTEGRAVIR-EMTRICITAB-TENOFOV 50-200-25 MG PO TABS
1.0000 | ORAL_TABLET | Freq: Every day | ORAL | Status: DC
  Administered 2024-09-06: 1 via ORAL
  Filled 2024-09-05: qty 1

## 2024-09-05 MED ORDER — METOPROLOL TARTRATE 5 MG/5ML IV SOLN: 5.0000 mg | Freq: Four times a day (QID) | INTRAVENOUS | Status: DC | PRN

## 2024-09-05 MED ORDER — EZETIMIBE 10 MG PO TABS
10.0000 mg | ORAL_TABLET | Freq: Every day | ORAL | Status: DC
  Administered 2024-09-06: 10 mg via ORAL
  Filled 2024-09-05: qty 1

## 2024-09-05 MED ORDER — ASPIRIN 81 MG PO TBEC
81.0000 mg | DELAYED_RELEASE_TABLET | Freq: Every day | ORAL | Status: DC
  Filled 2024-09-05: qty 1

## 2024-09-05 MED ORDER — PRASUGREL HCL 10 MG PO TABS
10.0000 mg | ORAL_TABLET | Freq: Every day | ORAL | Status: DC
Start: 2024-09-05 — End: 2024-09-06
  Administered 2024-09-06 (×2): 10 mg via ORAL
  Filled 2024-09-05 (×4): qty 1

## 2024-09-05 MED ORDER — SENNOSIDES-DOCUSATE SODIUM 8.6-50 MG PO TABS: 1.0000 | ORAL_TABLET | Freq: Every evening | ORAL | Status: DC | PRN

## 2024-09-05 MED ORDER — NITROGLYCERIN IN D5W 200-5 MCG/ML-% IV SOLN
0.0000 ug/min | INTRAVENOUS | Status: DC
  Administered 2024-09-05: 5 ug/min via INTRAVENOUS
  Filled 2024-09-05: qty 250

## 2024-09-05 MED ORDER — ROSUVASTATIN CALCIUM 20 MG PO TABS
40.0000 mg | ORAL_TABLET | Freq: Every day | ORAL | Status: DC
  Administered 2024-09-06: 40 mg via ORAL
  Filled 2024-09-05: qty 2

## 2024-09-05 MED ORDER — ACETAMINOPHEN 650 MG RE SUPP: 650.0000 mg | Freq: Four times a day (QID) | RECTAL | Status: DC | PRN

## 2024-09-05 MED ORDER — CARVEDILOL 6.25 MG PO TABS
6.2500 mg | ORAL_TABLET | Freq: Two times a day (BID) | ORAL | Status: DC
  Administered 2024-09-05 – 2024-09-06 (×2): 6.25 mg via ORAL
  Filled 2024-09-05: qty 2
  Filled 2024-09-05: qty 1

## 2024-09-05 MED ORDER — ONDANSETRON HCL 4 MG/2ML IJ SOLN: 4.0000 mg | Freq: Four times a day (QID) | INTRAMUSCULAR | Status: DC | PRN

## 2024-09-05 MED ORDER — ONDANSETRON HCL 4 MG PO TABS: 4.0000 mg | ORAL_TABLET | Freq: Four times a day (QID) | ORAL | Status: DC | PRN

## 2024-09-05 MED ORDER — ACETAMINOPHEN 325 MG PO TABS: 650.0000 mg | ORAL_TABLET | Freq: Four times a day (QID) | ORAL | Status: DC | PRN

## 2024-09-05 MED ORDER — HEPARIN (PORCINE) 25000 UT/250ML-% IV SOLN
1300.0000 [IU]/h | INTRAVENOUS | Status: DC
  Administered 2024-09-05: 1000 [IU]/h via INTRAVENOUS
  Filled 2024-09-05: qty 250

## 2024-09-05 MED ORDER — HEPARIN BOLUS VIA INFUSION
4000.0000 [IU] | Freq: Once | INTRAVENOUS | Status: AC
  Administered 2024-09-05: 4000 [IU] via INTRAVENOUS
  Filled 2024-09-05: qty 4000

## 2024-09-05 MED ORDER — PRASUGREL HCL 10 MG PO TABS: 10.0000 mg | ORAL_TABLET | Freq: Every day | ORAL | Status: DC

## 2024-09-05 NOTE — H&P (View-Only) (Signed)
 Cardiology Consultation   Patient ID: Gregory Grant MRN: [id number]; DOB: [birth year]  Admit date: 09/05/2024 Date of Consult: 09/05/2024  PCP:  Freddrick No   [facility] Providers Cardiologist:  Newman JINNY Lawrence, MD  previously followed by Dr. Alveta     Patient Profile: Gregory Grant is a 58 y.o. male with a hx of CAD s/p STEMI 2020 (DES-RCA) and 2025 (DES-LCX), dilated aortic root, HTN, HLD, PAD, and HIV stable on biktarvy  who is being seen 09/05/2024 for the evaluation of chest pain at the request of Dr. Caron.  History of Present Illness: [name] has a history of CAD with inferior STEMI 06/2019 treated with DES-P RCA and DES-mid RCA.  He suffered a second inferior STEMI 03/2023 treated with DES to LCx.  He has a history of PAD s/p stenting of the left SFA 04/2024.  Emergent LHC 06/2019 showed 85% proximal RCA stenosis and 100% distal RCA occlusion.  This was treated with 2.5 x 12 mm pRCA and 2.5 x 16 mm dRCA. Emergent LHC 03/2024 with LCX showing 60% mid vessel stenosis followed by 80 and 99% stenosis in the distal vessel treated with 2.25 x 28 mm covering both lesions.   Echo 05/2023 with LVEF 55-60% with no RWMA, grade 1 DD, normal RV size and function, and mild calcification of aortic valve without stenosis.   He underwent abdominal aortogram 04/2024 with VVS resulting in left superficial femoral artery angioplasty and stenting.   He presents to [facility] via EMS for chest pain that woke him from sleep concerning for unstable angina. A 10/10 stabbing chest pain in he center-right chest woke him from sleep and was associated with SOB, diaphoresis, and N/V and diarrhea. He took NTG x 1 with mild relief. He stayed awake from 11pm until this morning. The chest pain persisted, he took an additional NTG and called EMS. Since he continued to have chest pain, he was administered additional NTG and 324 mg ASA. On arrival, he was HDS with EKG with SR with TWI inferior and lateral  leads which is new from prior tracing in April 2025.   He states chest pain is still 2/10, no further vomiting or diarrhea. Found resting in bed, no complaints. He states this chest pain is similar to his prior angina. No recent illness. At baseline he is able to walk 0.5 miles to and from the grocery store, has not had angina prior to last evening.   Unfortunately, hs troponin has not resulted from this morning - lab is running a stat.   sCr 1.08 CO2 16 K 4.3 Na 132  He has been compliant on ASA and effient .   Past Medical History:  Diagnosis Date   Coughing 02/2015   GERD (gastroesophageal reflux disease)    HIV (human immunodeficiency virus infection) (HCC)    Peripheral arterial disease     Past Surgical History:  Procedure Laterality Date   ABDOMINAL AORTOGRAM N/A 05/13/2024   Procedure: ABDOMINAL AORTOGRAM;  Surgeon: Magda Debby SAILOR, MD;  Location: [facility] INVASIVE CV LAB;  Service: Cardiovascular;  Laterality: N/A;   CORONARY STENT INTERVENTION N/A 06/24/2019   Procedure: CORONARY STENT INTERVENTION;  Surgeon: Anner Alm ORN, MD;  Location: [facility] INVASIVE CV LAB;  Service: Cardiovascular;  Laterality: N/A;   CORONARY/GRAFT ACUTE MI REVASCULARIZATION N/A 06/24/2019   Procedure: Coronary/Graft Acute MI Revascularization;  Surgeon: Anner Alm ORN, MD;  Location: [facility] INVASIVE CV LAB;  Service: Cardiovascular;  Laterality: N/A;   CORONARY/GRAFT ACUTE MI REVASCULARIZATION N/A 04/12/2024  Procedure: Coronary/Graft Acute MI Revascularization;  Surgeon: Elmira Newman PARAS, MD;  Location: [facility] INVASIVE CV LAB;  Service: Cardiovascular;  Laterality: N/A;   LEFT HEART CATH AND CORONARY ANGIOGRAPHY N/A 06/24/2019   Procedure: LEFT HEART CATH AND CORONARY ANGIOGRAPHY;  Surgeon: Anner Alm ORN, MD;  Location: [facility] INVASIVE CV LAB;  Service: Cardiovascular;  Laterality: N/A;   LEFT HEART CATH AND CORONARY ANGIOGRAPHY N/A 04/12/2024   Procedure: LEFT HEART CATH AND CORONARY ANGIOGRAPHY;  Surgeon:  Elmira Newman PARAS, MD;  Location: [facility] INVASIVE CV LAB;  Service: Cardiovascular;  Laterality: N/A;   LOWER EXTREMITY ANGIOGRAPHY Left 05/13/2024   Procedure: Lower Extremity Angiography;  Surgeon: Magda Debby SAILOR, MD;  Location: [facility] INVASIVE CV LAB;  Service: Cardiovascular;  Laterality: Left;   LOWER EXTREMITY INTERVENTION Left 05/13/2024   Procedure: LOWER EXTREMITY INTERVENTION;  Surgeon: Magda Debby SAILOR, MD;  Location: [facility] INVASIVE CV LAB;  Service: Cardiovascular;  Laterality: Left;     Home Medications:  Prior to Admission medications   Medication Sig Start Date End Date Taking? Authorizing Provider  aspirin  EC 81 MG tablet Take 1 tablet (81 mg total) by mouth daily. Swallow whole. 07/11/24  Yes Darryle Currier L, PA-C  bictegravir-emtricitabine -tenofovir  AF (BIKTARVY ) 50-200-25 MG TABS tablet Take 1 tablet by mouth daily. 02/19/24  Yes Comer, Lamar ORN, MD  carvedilol  (COREG ) 6.25 MG tablet Take 1 tablet (6.25 mg total) by mouth 2 (two) times daily. Patient taking differently: Take 6.25 mg by mouth daily. 08/10/24  Yes Darryle Currier CROME, PA-C  ezetimibe  (ZETIA ) 10 MG tablet Take 1 tablet (10 mg total) by mouth daily. 08/10/24 11/08/24 Yes Darryle Currier CROME, PA-C  nitroGLYCERIN  (NITROSTAT ) 0.4 MG SL tablet Place 1 tablet (0.4 mg total) under the tongue every 5 (five) minutes as needed for chest pain. 07/11/24  Yes Darryle Currier L, PA-C  prasugrel  (EFFIENT ) 10 MG TABS tablet Take 1 tablet (10 mg total) by mouth daily. 07/11/24  Yes Darryle Currier L, PA-C  rosuvastatin  (CRESTOR ) 40 MG tablet Take 1 tablet (40 mg total) by mouth daily. 07/11/24  Yes Darryle Currier L, PA-C  famotidine  (PEPCID ) 20 MG tablet Take 1 tablet (20 mg total) by mouth 2 (two) times daily. To protect stomach 10/11/20 12/25/20  Fulp, Lannie, MD    Scheduled Meds:  Continuous Infusions:  PRN Meds:   Allergies:   No Known Allergies  Social History:   Social History   Socioeconomic History   Marital status: Legally Separated     Spouse name: Not on file   Number of children: Not on file   Years of education: Not on file   Highest education level: Not on file  Occupational History   Not on file  Tobacco Use   Smoking status: Every Day    Current packs/day: 0.50    Average packs/day: 0.5 packs/day for 30.0 years (15.0 ttl pk-yrs)    Types: Cigarettes   Smokeless tobacco: Never  Vaping Use   Vaping status: Never Used  Substance and Sexual Activity   Alcohol use: Yes    Alcohol/week: 1.0 standard drink of alcohol    Types: 1 Cans of beer per week   Drug use: No   Sexual activity: Yes    Comment: declined condoms  Other Topics Concern   Not on file  Social History Narrative   Not on file   Social Drivers of Health   Financial Resource Strain: Not on file  Food Insecurity: Not on file  Transportation Needs: Not on file  Physical Activity: Not on file  Stress: Not on file  Social Connections: Not on file  Intimate Partner Violence: Not on file    Family History:    Family History  Problem Relation Age of Onset   Dementia Mother    Diabetes Father      ROS:  Please see the history of present illness.   All other ROS reviewed and negative.     Physical Exam/Data: Vitals:   09/05/24 1100 09/05/24 1115 09/05/24 1130 09/05/24 1145  BP: (!) 168/111  (!) 170/103   Pulse: 67 (!) 56 (!) 54 62  Resp: 13 13 18 13   Temp:      TempSrc:      SpO2: 100% 98% 98% 99%  Weight:      Height:       No intake or output data in the 24 hours ending 09/05/24 1524    09/05/2024    9:30 AM 08/23/2024    9:53 AM 08/10/2024    1:22 PM  Last 3 Weights  Weight (lbs) 148 lb 175 lb 175 lb 6.4 oz  Weight (kg) 67.132 kg 79.379 kg 79.561 kg     Body mass index is 24.63 kg/m.  General:  Well nourished, well developed, in no acute distress HEENT: normal Neck: no JVD Vascular: No carotid bruits; Distal pulses 2+ bilaterally Cardiac:  normal S1, S2; RRR; no murmur  Lungs:  clear to auscultation bilaterally, no  wheezing, rhonchi or rales  Abd: soft, nontender, no hepatomegaly  Ext: no edema Musculoskeletal:  No deformities, BUE and BLE strength normal and equal Skin: warm and dry  Neuro:  CNs 2-12 intact, no focal abnormalities noted Psych:  Normal affect   EKG:  The EKG was personally reviewed and demonstrates:  SR with HR 61, TWI inferior and V6 Telemetry:  Telemetry was personally reviewed and demonstrates:  SR with HR 70s  Relevant CV Studies:  Echo 05/2024:  1. Left ventricular ejection fraction, by estimation, is 55 to 60%. Left  ventricular ejection fraction by 3D volume is 52 %. The left ventricle has  normal function. The left ventricle has no regional wall motion  abnormalities. There is mild concentric  left ventricular hypertrophy. Left ventricular diastolic parameters are  consistent with Grade I diastolic dysfunction (impaired relaxation). The  average left ventricular global longitudinal strain is -16.2 %. The global  longitudinal strain is normal.   2. Right ventricular systolic function is normal. The right ventricular  size is normal.   3. The mitral valve is normal in structure. No evidence of mitral valve  regurgitation. No evidence of mitral stenosis.   4. The aortic valve is tricuspid. There is mild calcification of the  aortic valve. Aortic valve regurgitation is trivial. Aortic valve  sclerosis/calcification is present, without any evidence of aortic  stenosis.   5. Aortic dilatation noted. There is mild dilatation of the aortic root,  measuring 42 mm. There is borderline dilatation of the ascending aorta,  measuring 39 mm.   6. The inferior vena cava is normal in size with greater than 50%  respiratory variability, suggesting right atrial pressure of 3 mmHg.   Laboratory Data: High Sensitivity Troponin:   Recent Labs  Lab 09/05/24 0928  TROPONINIHS 1,813*     Chemistry Recent Labs  Lab 09/05/24 0938 09/05/24 0941  NA 136 132*  K 4.3 4.3  CL 107 105   CO2  --  16*  GLUCOSE 95 93  BUN 9 10  CREATININE  1.00 1.08  CALCIUM   --  8.6*  GFRNONAA  --  >60  ANIONGAP  --  11    Recent Labs  Lab 09/05/24 0941  PROT 6.9  ALBUMIN 3.7  AST 44*  ALT 25  ALKPHOS 77  BILITOT 0.9   Lipids No results for input(s): CHOL, TRIG, HDL, LABVLDL, LDLCALC, CHOLHDL in the last 168 hours.  Hematology Recent Labs  Lab 09/05/24 0938 09/05/24 0941  WBC  --  7.6  RBC  --  4.57  HGB 16.0 15.6  HCT 47.0 46.1  MCV  --  100.9*  MCH  --  34.1*  MCHC  --  33.8  RDW  --  14.2  PLT  --  169   Thyroid No results for input(s): TSH, FREET4 in the last 168 hours.  BNPNo results for input(s): BNP, PROBNP in the last 168 hours.  DDimer No results for input(s): DDIMER in the last 168 hours.  Radiology/Studies:  DG Chest 2 View Result Date: 09/05/2024 CLINICAL DATA:  Substernal chest pain beginning last night. Nonproductive cough. EXAM: CHEST - 2 VIEW COMPARISON:  04/12/2024 FINDINGS: Lungs are adequately inflated without focal airspace consolidation or effusion. Cardiomediastinal silhouette and remainder of the exam is unchanged. IMPRESSION: No active cardiopulmonary disease. Electronically Signed   By: Toribio Agreste M.D.   On: 09/05/2024 10:33     Assessment and Plan:  Chest pain - concerning for unstable angina - EKG with new TWI AVF and V6 - chest pain nearly resolved with 4 doses of NTG - currently chest pain 2/10 - troponin pending - he has been compliant on ASA, effient , crestor , zetia , and coreg  - will await troponin, but suspect we may need to repeat his angiography for progression of disease - hs troponin: 1813, delta pending  - will start heparin  gtt, given PRN NTG, continue coreg    CAD STEMI x 2: 2020, 2025 - prior stenting in RCA and LCX - has been compliant on DAPT   Hypertension - continue coreg  6.25 mg BID   Hyperlipidemia with LDL goal < 55 04/12/2024: VLDL 15 08/09/2024: Cholesterol 132; HDL 48; LDL  Cholesterol (Calc) 67; Triglycerides 90 - continue crestor  and zetia  - would advocate for PCSK9i   Will plan for repeat heart cath tomorrow. NPO at [facility].    Risk Assessment/Risk Scores:    TIMI Risk Score for Unstable Angina or Non-ST Elevation MI:   The patient's TIMI risk score is 4, which indicates a 20% risk of all cause mortality, new or recurrent myocardial infarction or need for urgent revascularization in the next 14 days.         For questions or updates, please contact [facility] Please consult [email or web address] for contact info under      Signed, Jon Nat Hails, PA  09/05/2024 3:24 PM

## 2024-09-05 NOTE — ED Provider Notes (Addendum)
 [place] EMERGENCY DEPARTMENT AT [facility] Provider Note   CSN: [id number] Arrival date & time: 09/05/24  9080     Patient presents with: Chest Pain   Gregory Grant is a 58 y.o. male.   HPI      11PM chest pain Put the nitroglycerin  under tongue, no change took second, no change last night This called morning called EMS Took 3 with EMS and now pain is now 3/10 from 10/10  Sharp pain, prior stent placement felt similar, 2 stents in chest, one in leg.  Sharp pain at first and then tightness.  Just in the chest in the middle. No pain into abdomen, back, arm, jaw and no leg pain  Shortnetss of breath earlier, not now Nausea and vomiting and diarrhea earlier but not now From 11-230 had vomiting/diarrhea. 8 times total for both. No black or bloody stools No fever Slight cough Diaphoresis  Not positional, not worse with deep breaths, going to sit up, movements made it worse    Past Medical History:  Diagnosis Date   Coughing 02/2015   GERD (gastroesophageal reflux disease)    HIV (human immunodeficiency virus infection) (HCC)    Peripheral arterial disease     Past Surgical History:  Procedure Laterality Date   ABDOMINAL AORTOGRAM N/A 05/13/2024   Procedure: ABDOMINAL AORTOGRAM;  Surgeon: Magda Debby SAILOR, MD;  Location: [facility] INVASIVE CV LAB;  Service: Cardiovascular;  Laterality: N/A;   CORONARY STENT INTERVENTION N/A 06/24/2019   Procedure: CORONARY STENT INTERVENTION;  Surgeon: Anner Alm ORN, MD;  Location: [facility] INVASIVE CV LAB;  Service: Cardiovascular;  Laterality: N/A;   CORONARY/GRAFT ACUTE MI REVASCULARIZATION N/A 06/24/2019   Procedure: Coronary/Graft Acute MI Revascularization;  Surgeon: Anner Alm ORN, MD;  Location: [facility] INVASIVE CV LAB;  Service: Cardiovascular;  Laterality: N/A;   CORONARY/GRAFT ACUTE MI REVASCULARIZATION N/A 04/12/2024   Procedure: Coronary/Graft Acute MI Revascularization;  Surgeon: Elmira Newman PARAS, MD;  Location: [facility]  INVASIVE CV LAB;  Service: Cardiovascular;  Laterality: N/A;   LEFT HEART CATH AND CORONARY ANGIOGRAPHY N/A 06/24/2019   Procedure: LEFT HEART CATH AND CORONARY ANGIOGRAPHY;  Surgeon: Anner Alm ORN, MD;  Location: [facility] INVASIVE CV LAB;  Service: Cardiovascular;  Laterality: N/A;   LEFT HEART CATH AND CORONARY ANGIOGRAPHY N/A 04/12/2024   Procedure: LEFT HEART CATH AND CORONARY ANGIOGRAPHY;  Surgeon: Elmira Newman PARAS, MD;  Location: [facility] INVASIVE CV LAB;  Service: Cardiovascular;  Laterality: N/A;   LOWER EXTREMITY ANGIOGRAPHY Left 05/13/2024   Procedure: Lower Extremity Angiography;  Surgeon: Magda Debby SAILOR, MD;  Location: [facility] INVASIVE CV LAB;  Service: Cardiovascular;  Laterality: Left;   LOWER EXTREMITY INTERVENTION Left 05/13/2024   Procedure: LOWER EXTREMITY INTERVENTION;  Surgeon: Magda Debby SAILOR, MD;  Location: [facility] INVASIVE CV LAB;  Service: Cardiovascular;  Laterality: Left;      Past Medical History:  Diagnosis Date   Coughing 02/2015   GERD (gastroesophageal reflux disease)    HIV (human immunodeficiency virus infection) (HCC)    Peripheral arterial disease      Prior to Admission medications   Medication Sig Start Date End Date Taking? Authorizing Provider  aspirin  EC 81 MG tablet Take 1 tablet (81 mg total) by mouth daily. Swallow whole. 07/11/24  Yes Darryle Currier L, PA-C  bictegravir-emtricitabine -tenofovir  AF (BIKTARVY ) 50-200-25 MG TABS tablet Take 1 tablet by mouth daily. 02/19/24  Yes Comer, Lamar ORN, MD  carvedilol  (COREG ) 6.25 MG tablet Take 1 tablet (6.25 mg total) by mouth 2 (two) times daily.  Patient taking differently: Take 6.25 mg by mouth daily. 08/10/24  Yes Darryle Currier L, PA-C  ezetimibe  (ZETIA ) 10 MG tablet Take 1 tablet (10 mg total) by mouth daily. 08/10/24 11/08/24 Yes Darryle Currier L, PA-C  nitroGLYCERIN  (NITROSTAT ) 0.4 MG SL tablet Place 1 tablet (0.4 mg total) under the tongue every 5 (five) minutes as needed for chest pain. 07/11/24  Yes Darryle Currier L, PA-C   prasugrel  (EFFIENT ) 10 MG TABS tablet Take 1 tablet (10 mg total) by mouth daily. 07/11/24  Yes Darryle Currier L, PA-C  rosuvastatin  (CRESTOR ) 40 MG tablet Take 1 tablet (40 mg total) by mouth daily. 07/11/24  Yes Darryle Currier L, PA-C  famotidine  (PEPCID ) 20 MG tablet Take 1 tablet (20 mg total) by mouth 2 (two) times daily. To protect stomach 10/11/20 12/25/20  Alec House, MD    Allergies: Patient has no known allergies.    Review of Systems  Updated Vital Signs BP (!) 129/96 (BP Location: Left Arm)   Pulse 76   Temp 98.7 F (37.1 C) (Oral)   Resp 20   Ht 5' 5 (1.651 m)   Wt 67.1 kg   SpO2 97%   BMI 24.62 kg/m   Physical Exam Vitals and nursing note reviewed.  Constitutional:      General: He is not in acute distress.    Appearance: He is well-developed. He is not diaphoretic.  HENT:     Head: Normocephalic and atraumatic.  Eyes:     Conjunctiva/sclera: Conjunctivae normal.  Cardiovascular:     Rate and Rhythm: Normal rate and regular rhythm.     Heart sounds: Normal heart sounds. No murmur heard.    No friction rub. No gallop.  Pulmonary:     Effort: Pulmonary effort is normal. No respiratory distress.     Breath sounds: Normal breath sounds. No wheezing or rales.  Abdominal:     General: There is no distension.     Palpations: Abdomen is soft.     Tenderness: There is no abdominal tenderness. There is no guarding.  Musculoskeletal:     Cervical back: Normal range of motion.  Skin:    General: Skin is warm and dry.  Neurological:     Mental Status: He is alert and oriented to person, place, and time.     (all labs ordered are listed, but only abnormal results are displayed) Labs Reviewed  CBC WITH DIFFERENTIAL/PLATELET - Abnormal; Notable for the following components:      Result Value   MCV 100.9 (*)    MCH 34.1 (*)    All other components within normal limits  COMPREHENSIVE METABOLIC PANEL WITH GFR - Abnormal; Notable for the following components:    Sodium 132 (*)    CO2 16 (*)    Calcium  8.6 (*)    AST 44 (*)    All other components within normal limits  HEPARIN  LEVEL (UNFRACTIONATED) - Abnormal; Notable for the following components:   Heparin  Unfractionated 0.26 (*)    All other components within normal limits  COMPREHENSIVE METABOLIC PANEL WITH GFR - Abnormal; Notable for the following components:   Sodium 134 (*)    CO2 18 (*)    Glucose, Bld 114 (*)    Calcium  8.5 (*)    Total Protein 6.2 (*)    Albumin 3.3 (*)    AST 74 (*)    All other components within normal limits  HEPARIN  LEVEL (UNFRACTIONATED) - Abnormal; Notable for the following components:   Heparin   Unfractionated 0.22 (*)    All other components within normal limits  LIPID PANEL - Abnormal; Notable for the following components:   HDL 32 (*)    All other components within normal limits  I-STAT CHEM 8, ED - Abnormal; Notable for the following components:   Calcium , Ion 1.06 (*)    TCO2 18 (*)    All other components within normal limits  TROPONIN I (HIGH SENSITIVITY) - Abnormal; Notable for the following components:   Troponin I (High Sensitivity) 1,813 (*)    All other components within normal limits  TROPONIN I (HIGH SENSITIVITY) - Abnormal; Notable for the following components:   Troponin I (High Sensitivity) 4,380 (*)    All other components within normal limits  CBC  HEPARIN  LEVEL (UNFRACTIONATED)    EKG: EKG Interpretation Date/Time:  Monday September 05 2024 09:33:24 EDT Ventricular Rate:  66 PR Interval:  146 QRS Duration:  87 QT Interval:  443 QTC Calculation: 465 R Axis:   -48  Text Interpretation: Sinus rhythm Left anterior fascicular block Low voltage, precordial leads Abnormal R-wave progression, early transition Nonspecific T abnormalities, inferior leads No significant change since last tracing Confirmed by Dreama Longs ([id number]) on 09/05/2024 9:39:02 AM  Radiology: [facility] Chest 2 View Result Date: 09/05/2024 CLINICAL DATA:  Substernal  chest pain beginning last night. Nonproductive cough. EXAM: CHEST - 2 VIEW COMPARISON:  04/12/2024 FINDINGS: Lungs are adequately inflated without focal airspace consolidation or effusion. Cardiomediastinal silhouette and remainder of the exam is unchanged. IMPRESSION: No active cardiopulmonary disease. Electronically Signed   By: Toribio Agreste M.D.   On: 09/05/2024 10:33     .Critical Care  Performed by: Dreama Longs, MD Authorized by: Dreama Longs, MD   Critical care provider statement:    Critical care time (minutes):  30   Critical care was time spent personally by me on the following activities:  Development of treatment plan with patient or surrogate, discussions with consultants, examination of patient, ordering and review of laboratory studies, ordering and review of radiographic studies, ordering and performing treatments and interventions, pulse oximetry and review of old charts    Medications Ordered in the ED  nitroGLYCERIN  50 mg in dextrose 5 % 250 mL (0.2 mg/mL) infusion (40 mcg/min Intravenous Rate/Dose Change 09/05/24 2042)  acetaminophen  (TYLENOL ) tablet 650 mg (has no administration in time range)    Or  acetaminophen  (TYLENOL ) suppository 650 mg (has no administration in time range)  ondansetron  (ZOFRAN ) tablet 4 mg (has no administration in time range)    Or  ondansetron  (ZOFRAN ) injection 4 mg (has no administration in time range)  senna-docusate (Senokot-S) tablet 1 tablet (has no administration in time range)  metoprolol  tartrate (LOPRESSOR ) injection 5 mg (has no administration in time range)  aspirin  EC tablet 81 mg (0 mg Oral Hold 09/06/24 1000)  bictegravir-emtricitabine -tenofovir  AF (BIKTARVY ) 50-200-25 MG per tablet 1 tablet (1 tablet Oral Given 09/06/24 1005)  carvedilol  (COREG ) tablet 6.25 mg (6.25 mg Oral Given 09/06/24 1004)  ezetimibe  (ZETIA ) tablet 10 mg (10 mg Oral Given 09/06/24 1004)  rosuvastatin  (CRESTOR ) tablet 40 mg (40 mg Oral Given 09/06/24  1005)  prasugrel  (EFFIENT ) tablet 10 mg (10 mg Oral Given 09/06/24 1005)  heparin  ADULT infusion 100 units/mL (25000 units/250mL) (1,300 Units/hr Intravenous Rate/Dose Change 09/06/24 0450)  aspirin  chewable tablet 81 mg (81 mg Oral Given 09/06/24 0607)  free water  500 mL (500 mLs Oral Given 09/06/24 0112)  heparin  bolus via infusion 4,000 Units (4,000 Units Intravenous Bolus from Bag  09/05/24 1713)                                     58 year old male with a history of coronary artery disease, peripheral artery disease, HIV adherent to medications, who presents with concern for chest pain.  Differential diagnosis for chest pain includes pulmonary embolus, dissection, pneumothorax, pneumonia, ACS, myocarditis, pericarditis.    EKG was done and evaluate by me and showed no acute ST changes and no signs of pericarditis showed no STEMI, does show TW inversions.   Chest x-ray was done and evaluated by me and radiology and showed no sign of pneumonia or pneumothorax.  Have low clinical suspicion for aortic dissection, pulmonary embolus by history and exam, no asymmetric leg swelling, no shortness of breath, no tachycardia or hypoxia, no specific PE risk factors, normal pulses bilaterally with history not consistent with dissection nor PE.  Consulted cardiology given anginal symptoms similar to what you have with prior MIs.  His troponin returned at 1813.  He had received 324 mg of aspirin  with EMS as well as nitroglycerin .  Cardiology has already been consulted with heparin  drip and nitro drip initiated.  Hospitalist to admit for further care.      Final diagnoses:  NSTEMI (non-ST elevated myocardial infarction) [facility])    ED Discharge Orders     None          Dreama Longs, MD 09/06/24 1027    Dreama Longs, MD 10/12/24 1237

## 2024-09-05 NOTE — Hospital Course (Addendum)
 Gregory Grant is a 58 y.o. male with PMH of CAD ST MI in 2020 with DES to RCA, DES to circumflex in 2025 for repeat ST EMI, HTN, HLD, HIV, PAD, tobacco abuse, recent emergent LHC and recent echo with EF 55 to 60% presented with chest pain that woke him up from sleep.  Pain described sharp right central side of the chest 10/10 with associated shortness of breath vomiting some diarrhea.  Had some relief with nitroglycerin . Currently no chest pain/ Patient otherwise denies any fever, chills, headache, focal weakness, numbness tingling, speech difficulties  He has not slept since last night 11 pm and feels tired. In the ED: BP on higher side 170s, diastolic 100, EKG sinus rhythm apparent evolving ST elevation changes in the inferior leads, new high lateral T wave inversion troponin bumped to 1813.  Other labs showed mild hyponatremia stable LFTs CBC Chest x-ray no active disease cardiology was consulted. Admission requested for NSTEMI> admitted on IV nitroglycerin  drip and heparin  drip. S/p LHC>Occluded Lcx stent that he put couple months ago. No benefit in intervening, likely completed infarct.CARDIO IS OK W/ Discharge and they will arrange follow up  as outpatient.  Subjective: Seen and examined today No chest pain Does not like the bed he reports he is going home after cardiac cath Overnight heart rate 50s-60s, BP 120s-150s, not hypoxic Labs reviewed bicarb of 18 LDL 45 stable CBC  Discharge Diagnosis:  NSTEMI CAD status post PCI to the RCA and recent Lcx: Hs trops [id number]>[id number].  Cardiology following closely on heparin  and nitroglycerin  drip.  S/p LHC>Occluded Lcx stent that he put couple months ago. No benefit in intervening, likely completed infarct.CARDIO IS OK W/ Discharge and they will arrange follow up  as outpatient. Continue home aspirin  81 Effient  Coreg  Zetia  and Crestor .  HIV disease: Controlled, last CD4 1547 in August/26. Continue home Biktarvy    Essential  hypertension Hypertensive urgency: BP was poorly controlled on admit.  Fairly stable continue home carvedilol .  Continue nitroglycerin   PAD Dyslipidemia Tobacco abuse: Advised tobacco cessation.  Continue statins ,antiplatelet as per #1.  DVT prophylaxis: heparin  gtt Code Status:   Code Status: Full Code Family Communication: plan of care discussed with patient at bedside. Patient status is: Remains hospitalized because of severity of illness Level of care: Progressive   Dispo: The patient is from: home            Anticipated disposition: home today Objective: Vitals last 24 hrs: Vitals:   09/05/24 2030 09/05/24 2100 09/06/24 0351 09/06/24 0731  BP: (!) 154/96 (!) 144/87 127/76 (!) 129/96  Pulse: 77 (!) 58 84 76  Resp: (!) 21 18 20 20   Temp:   99.2 F (37.3 C) 98.7 F (37.1 C)  TempSrc:   Oral Oral  SpO2:  95% 99% 97%  Weight:   67.1 kg   Height:        Physical Examination: General exam: alert awake, oriented x 3 HEENT:Oral mucosa moist, Ear/Nose WNL grossly Respiratory system: Bilaterally clear BS,no use of accessory muscle Cardiovascular system: S1 & S2 +, No JVD. Gastrointestinal system: Abdomen soft,NT,ND, BS+ Nervous System: Alert, awake, moving all extremities,and following commands. Extremities: extremities warm, leg edema neg Skin: No rashes,no icterus. MSK: Normal muscle bulk,tone, power   Medications reviewed:  Scheduled Meds:  aspirin  EC  81 mg Oral Daily   bictegravir-emtricitabine -tenofovir  AF  1 tablet Oral Daily   carvedilol   6.25 mg Oral BID WC   ezetimibe   10 mg Oral Daily  prasugrel   10 mg Oral Daily   rosuvastatin   40 mg Oral Daily   Continuous Infusions:  heparin  1,300 Units/hr (09/06/24 0450)   nitroGLYCERIN  40 mcg/min (09/05/24 2042)   Diet: Diet Order             Diet NPO time specified Except for: Sips with Meds  Diet effective 0500

## 2024-09-05 NOTE — ED Notes (Signed)
 EVS called to mop and sweep the room.

## 2024-09-05 NOTE — Progress Notes (Signed)
 PHARMACY - ANTICOAGULATION Pharmacy Consult for heparin  Indication: chest pain/ACS Brief A/P: Heparin  level subtherapeutic Increase Heparin  rate  No Known Allergies  Patient Measurements: Height: 5' 5 (165.1 cm) Weight: 67.1 kg (148 lb) IBW/kg (Calculated) : 61.5 HEPARIN  DW (KG): 67.1  Vital Signs: Temp: 98.2 F (36.8 C) (09/22 1530) Temp Source: Oral (09/22 1530) BP: 144/87 (09/22 2100) Pulse Rate: 58 (09/22 2100)  Labs: Recent Labs    09/05/24 0928 09/05/24 0938 09/05/24 0941 09/05/24 1132 09/05/24 2236  HGB  --  16.0 15.6  --   --   HCT  --  47.0 46.1  --   --   PLT  --   --  169  --   --   HEPARINUNFRC  --   --   --   --  0.26*  CREATININE  --  1.00 1.08  --   --   TROPONINIHS 1,813*  --   --  4,380*  --     Estimated Creatinine Clearance: 66.4 mL/min (by C-G formula based on SCr of 1.08 mg/dL).  Assessment: 58 y.o. male with chest pain for heparin    Goal of Therapy:  Heparin  level 0.3-0.7 units/ml Monitor platelets by anticoagulation protocol: Yes   Plan:  Increase Heparin  1150 units/hr Follow-up am labs.  Cathlyn Arrant, PharmD, BCPS 09/05/2024,11:28 PM

## 2024-09-05 NOTE — ED Triage Notes (Signed)
 Patient presents to ed via [facility] from home c/o substernal chest pain onset 11 pm last night non radiating, states its a pressure c;o N?V?D c/o nonprod cough. States pain is worse with movement. States he had and MI with stent placement  several months ago.

## 2024-09-05 NOTE — Consult Note (Signed)
 Cardiology Consultation   Patient ID: Gregory Grant MRN: [id number]; DOB: [birth year]  Admit date: 09/05/2024 Date of Consult: 09/05/2024  PCP:  Freddrick No   [facility] Providers Cardiologist:  Newman JINNY Lawrence, MD  previously followed by Dr. Alveta     Patient Profile: Gregory Grant is a 58 y.o. male with a hx of CAD s/p STEMI 2020 (DES-RCA) and 2025 (DES-LCX), dilated aortic root, HTN, HLD, PAD, and HIV stable on biktarvy  who is being seen 09/05/2024 for the evaluation of chest pain at the request of Dr. Caron.  History of Present Illness: Gregory Grant has a history of CAD with inferior STEMI 06/2019 treated with DES-P RCA and DES-mid RCA.  He suffered a second inferior STEMI 03/2023 treated with DES to LCx.  He has a history of PAD s/p stenting of the left SFA 04/2024.  Emergent LHC 06/2019 showed 85% proximal RCA stenosis and 100% distal RCA occlusion.  This was treated with 2.5 x 12 mm pRCA and 2.5 x 16 mm dRCA. Emergent LHC 03/2024 with LCX showing 60% mid vessel stenosis followed by 80 and 99% stenosis in the distal vessel treated with 2.25 x 28 mm covering both lesions.   Echo 05/2023 with LVEF 55-60% with no RWMA, grade 1 DD, normal RV size and function, and mild calcification of aortic valve without stenosis.   He underwent abdominal aortogram 04/2024 with VVS resulting in left superficial femoral artery angioplasty and stenting.   He presents to [facility] via EMS for chest pain that woke him from sleep concerning for unstable angina. A 10/10 stabbing chest pain in he center-right chest woke him from sleep and was associated with SOB, diaphoresis, and N/V and diarrhea. He took NTG x 1 with mild relief. He stayed awake from 11pm until this morning. The chest pain persisted, he took an additional NTG and called EMS. Since he continued to have chest pain, he was administered additional NTG and 324 mg ASA. On arrival, he was HDS with EKG with SR with TWI inferior and lateral  leads which is new from prior tracing in April 2025.   He states chest pain is still 2/10, no further vomiting or diarrhea. Found resting in bed, no complaints. He states this chest pain is similar to his prior angina. No recent illness. At baseline he is able to walk 0.5 miles to and from the grocery store, has not had angina prior to last evening.   Unfortunately, hs troponin has not resulted from this morning - lab is running a stat.   sCr 1.08 CO2 16 K 4.3 Na 132  He has been compliant on ASA and effient .   Past Medical History:  Diagnosis Date   Coughing 02/2015   GERD (gastroesophageal reflux disease)    HIV (human immunodeficiency virus infection) (HCC)    Peripheral arterial disease     Past Surgical History:  Procedure Laterality Date   ABDOMINAL AORTOGRAM N/A 05/13/2024   Procedure: ABDOMINAL AORTOGRAM;  Surgeon: Magda Debby SAILOR, MD;  Location: [facility] INVASIVE CV LAB;  Service: Cardiovascular;  Laterality: N/A;   CORONARY STENT INTERVENTION N/A 06/24/2019   Procedure: CORONARY STENT INTERVENTION;  Surgeon: Anner Alm ORN, MD;  Location: [facility] INVASIVE CV LAB;  Service: Cardiovascular;  Laterality: N/A;   CORONARY/GRAFT ACUTE MI REVASCULARIZATION N/A 06/24/2019   Procedure: Coronary/Graft Acute MI Revascularization;  Surgeon: Anner Alm ORN, MD;  Location: [facility] INVASIVE CV LAB;  Service: Cardiovascular;  Laterality: N/A;   CORONARY/GRAFT ACUTE MI REVASCULARIZATION N/A 04/12/2024  Procedure: Coronary/Graft Acute MI Revascularization;  Surgeon: Elmira Newman PARAS, MD;  Location: [facility] INVASIVE CV LAB;  Service: Cardiovascular;  Laterality: N/A;   LEFT HEART CATH AND CORONARY ANGIOGRAPHY N/A 06/24/2019   Procedure: LEFT HEART CATH AND CORONARY ANGIOGRAPHY;  Surgeon: Anner Alm ORN, MD;  Location: [facility] INVASIVE CV LAB;  Service: Cardiovascular;  Laterality: N/A;   LEFT HEART CATH AND CORONARY ANGIOGRAPHY N/A 04/12/2024   Procedure: LEFT HEART CATH AND CORONARY ANGIOGRAPHY;  Surgeon:  Elmira Newman PARAS, MD;  Location: [facility] INVASIVE CV LAB;  Service: Cardiovascular;  Laterality: N/A;   LOWER EXTREMITY ANGIOGRAPHY Left 05/13/2024   Procedure: Lower Extremity Angiography;  Surgeon: Magda Debby SAILOR, MD;  Location: [facility] INVASIVE CV LAB;  Service: Cardiovascular;  Laterality: Left;   LOWER EXTREMITY INTERVENTION Left 05/13/2024   Procedure: LOWER EXTREMITY INTERVENTION;  Surgeon: Magda Debby SAILOR, MD;  Location: [facility] INVASIVE CV LAB;  Service: Cardiovascular;  Laterality: Left;     Home Medications:  Prior to Admission medications   Medication Sig Start Date End Date Taking? Authorizing Provider  aspirin  EC 81 MG tablet Take 1 tablet (81 mg total) by mouth daily. Swallow whole. 07/11/24  Yes Darryle Currier L, PA-C  bictegravir-emtricitabine -tenofovir  AF (BIKTARVY ) 50-200-25 MG TABS tablet Take 1 tablet by mouth daily. 02/19/24  Yes Comer, Lamar ORN, MD  carvedilol  (COREG ) 6.25 MG tablet Take 1 tablet (6.25 mg total) by mouth 2 (two) times daily. Patient taking differently: Take 6.25 mg by mouth daily. 08/10/24  Yes Darryle Currier CROME, PA-C  ezetimibe  (ZETIA ) 10 MG tablet Take 1 tablet (10 mg total) by mouth daily. 08/10/24 11/08/24 Yes Darryle Currier CROME, PA-C  nitroGLYCERIN  (NITROSTAT ) 0.4 MG SL tablet Place 1 tablet (0.4 mg total) under the tongue every 5 (five) minutes as needed for chest pain. 07/11/24  Yes Darryle Currier L, PA-C  prasugrel  (EFFIENT ) 10 MG TABS tablet Take 1 tablet (10 mg total) by mouth daily. 07/11/24  Yes Darryle Currier L, PA-C  rosuvastatin  (CRESTOR ) 40 MG tablet Take 1 tablet (40 mg total) by mouth daily. 07/11/24  Yes Darryle Currier L, PA-C  famotidine  (PEPCID ) 20 MG tablet Take 1 tablet (20 mg total) by mouth 2 (two) times daily. To protect stomach 10/11/20 12/25/20  Fulp, Lannie, MD    Scheduled Meds:  Continuous Infusions:  PRN Meds:   Allergies:   No Known Allergies  Social History:   Social History   Socioeconomic History   Marital status: Legally Separated     Spouse name: Not on file   Number of children: Not on file   Years of education: Not on file   Highest education level: Not on file  Occupational History   Not on file  Tobacco Use   Smoking status: Every Day    Current packs/day: 0.50    Average packs/day: 0.5 packs/day for 30.0 years (15.0 ttl pk-yrs)    Types: Cigarettes   Smokeless tobacco: Never  Vaping Use   Vaping status: Never Used  Substance and Sexual Activity   Alcohol use: Yes    Alcohol/week: 1.0 standard drink of alcohol    Types: 1 Cans of beer per week   Drug use: No   Sexual activity: Yes    Comment: declined condoms  Other Topics Concern   Not on file  Social History Narrative   Not on file   Social Drivers of Health   Financial Resource Strain: Not on file  Food Insecurity: Not on file  Transportation Needs: Not on file  Physical Activity: Not on file  Stress: Not on file  Social Connections: Not on file  Intimate Partner Violence: Not on file    Family History:    Family History  Problem Relation Age of Onset   Dementia Mother    Diabetes Father      ROS:  Please see the history of present illness.   All other ROS reviewed and negative.     Physical Exam/Data: Vitals:   09/05/24 1100 09/05/24 1115 09/05/24 1130 09/05/24 1145  BP: (!) 168/111  (!) 170/103   Pulse: 67 (!) 56 (!) 54 62  Resp: 13 13 18 13   Temp:      TempSrc:      SpO2: 100% 98% 98% 99%  Weight:      Height:       No intake or output data in the 24 hours ending 09/05/24 1524    09/05/2024    9:30 AM 08/23/2024    9:53 AM 08/10/2024    1:22 PM  Last 3 Weights  Weight (lbs) 148 lb 175 lb 175 lb 6.4 oz  Weight (kg) 67.132 kg 79.379 kg 79.561 kg     Body mass index is 24.63 kg/m.  General:  Well nourished, well developed, in no acute distress HEENT: normal Neck: no JVD Vascular: No carotid bruits; Distal pulses 2+ bilaterally Cardiac:  normal S1, S2; RRR; no murmur  Lungs:  clear to auscultation bilaterally, no  wheezing, rhonchi or rales  Abd: soft, nontender, no hepatomegaly  Ext: no edema Musculoskeletal:  No deformities, BUE and BLE strength normal and equal Skin: warm and dry  Neuro:  CNs 2-12 intact, no focal abnormalities noted Psych:  Normal affect   EKG:  The EKG was personally reviewed and demonstrates:  SR with HR 61, TWI inferior and V6 Telemetry:  Telemetry was personally reviewed and demonstrates:  SR with HR 70s  Relevant CV Studies:  Echo 05/2024:  1. Left ventricular ejection fraction, by estimation, is 55 to 60%. Left  ventricular ejection fraction by 3D volume is 52 %. The left ventricle has  normal function. The left ventricle has no regional wall motion  abnormalities. There is mild concentric  left ventricular hypertrophy. Left ventricular diastolic parameters are  consistent with Grade I diastolic dysfunction (impaired relaxation). The  average left ventricular global longitudinal strain is -16.2 %. The global  longitudinal strain is normal.   2. Right ventricular systolic function is normal. The right ventricular  size is normal.   3. The mitral valve is normal in structure. No evidence of mitral valve  regurgitation. No evidence of mitral stenosis.   4. The aortic valve is tricuspid. There is mild calcification of the  aortic valve. Aortic valve regurgitation is trivial. Aortic valve  sclerosis/calcification is present, without any evidence of aortic  stenosis.   5. Aortic dilatation noted. There is mild dilatation of the aortic root,  measuring 42 mm. There is borderline dilatation of the ascending aorta,  measuring 39 mm.   6. The inferior vena cava is normal in size with greater than 50%  respiratory variability, suggesting right atrial pressure of 3 mmHg.   Laboratory Data: High Sensitivity Troponin:   Recent Labs  Lab 09/05/24 0928  TROPONINIHS 1,813*     Chemistry Recent Labs  Lab 09/05/24 0938 09/05/24 0941  NA 136 132*  K 4.3 4.3  CL 107 105   CO2  --  16*  GLUCOSE 95 93  BUN 9 10  CREATININE  1.00 1.08  CALCIUM   --  8.6*  GFRNONAA  --  >60  ANIONGAP  --  11    Recent Labs  Lab 09/05/24 0941  PROT 6.9  ALBUMIN 3.7  AST 44*  ALT 25  ALKPHOS 77  BILITOT 0.9   Lipids No results for input(s): CHOL, TRIG, HDL, LABVLDL, LDLCALC, CHOLHDL in the last 168 hours.  Hematology Recent Labs  Lab 09/05/24 0938 09/05/24 0941  WBC  --  7.6  RBC  --  4.57  HGB 16.0 15.6  HCT 47.0 46.1  MCV  --  100.9*  MCH  --  34.1*  MCHC  --  33.8  RDW  --  14.2  PLT  --  169   Thyroid No results for input(s): TSH, FREET4 in the last 168 hours.  BNPNo results for input(s): BNP, PROBNP in the last 168 hours.  DDimer No results for input(s): DDIMER in the last 168 hours.  Radiology/Studies:  DG Chest 2 View Result Date: 09/05/2024 CLINICAL DATA:  Substernal chest pain beginning last night. Nonproductive cough. EXAM: CHEST - 2 VIEW COMPARISON:  04/12/2024 FINDINGS: Lungs are adequately inflated without focal airspace consolidation or effusion. Cardiomediastinal silhouette and remainder of the exam is unchanged. IMPRESSION: No active cardiopulmonary disease. Electronically Signed   By: Toribio Agreste M.D.   On: 09/05/2024 10:33     Assessment and Plan:  Chest pain - concerning for unstable angina - EKG with new TWI AVF and V6 - chest pain nearly resolved with 4 doses of NTG - currently chest pain 2/10 - troponin pending - he has been compliant on ASA, effient , crestor , zetia , and coreg  - will await troponin, but suspect we may need to repeat his angiography for progression of disease - hs troponin: 1813, delta pending  - will start heparin  gtt, given PRN NTG, continue coreg    CAD STEMI x 2: 2020, 2025 - prior stenting in RCA and LCX - has been compliant on DAPT   Hypertension - continue coreg  6.25 mg BID   Hyperlipidemia with LDL goal < 55 04/12/2024: VLDL 15 08/09/2024: Cholesterol 132; HDL 48; LDL  Cholesterol (Calc) 67; Triglycerides 90 - continue crestor  and zetia  - would advocate for PCSK9i   Will plan for repeat heart cath tomorrow. NPO at [facility].    Risk Assessment/Risk Scores:    TIMI Risk Score for Unstable Angina or Non-ST Elevation MI:   The patient's TIMI risk score is 4, which indicates a 20% risk of all cause mortality, new or recurrent myocardial infarction or need for urgent revascularization in the next 14 days.         For questions or updates, please contact [facility] Please consult [email or web address] for contact info under      Signed, Jon Nat Hails, PA  09/05/2024 3:24 PM

## 2024-09-05 NOTE — Progress Notes (Signed)
 PHARMACY - ANTICOAGULATION CONSULT NOTE  Pharmacy Consult for heparin  Indication: chest pain/ACS  No Known Allergies  Patient Measurements: Height: 5' 5 (165.1 cm) Weight: 67.1 kg (148 lb) IBW/kg (Calculated) : 61.5 HEPARIN  DW (KG): 67.1  Vital Signs: Temp: 98.2 F (36.8 C) (09/22 1530) Temp Source: Oral (09/22 1530) BP: 159/113 (09/22 1530) Pulse Rate: 54 (09/22 1530)  Labs: Recent Labs    09/05/24 0928 09/05/24 0938 09/05/24 0941  HGB  --  16.0 15.6  HCT  --  47.0 46.1  PLT  --   --  169  CREATININE  --  1.00 1.08  TROPONINIHS 1,813*  --   --     Estimated Creatinine Clearance: 66.4 mL/min (by C-G formula based on SCr of 1.08 mg/dL).   Medical History: Past Medical History:  Diagnosis Date   Coughing 02/2015   GERD (gastroesophageal reflux disease)    HIV (human immunodeficiency virus infection) (HCC)    Peripheral arterial disease     Medications:  See electronic med rec  Assessment: 58 y.o. M presents with NSTEMI. To begin heparin . No AC PTA. CBC stable.  Goal of Therapy:  Heparin  level 0.3-0.7 units/ml Monitor platelets by anticoagulation protocol: Yes   Plan:  Heparin  IV bolus 4000 units Heparin  gtt at 1000 units/hr Will f/u heparin  level in 6 hours Daily heparin  level and CBC  Vito Ralph, PharmD, BCPS Please see amion for complete clinical pharmacist phone list 09/05/2024,4:38 PM

## 2024-09-05 NOTE — H&P (Signed)
 History and Physical    Gregory Grant FMW:[id number] DOB: 03-10-1966 DOA: 09/05/2024  PCP: Pcp, No   Patient coming from:Home Chief Complaint  Patient presents with   Chest Pain     HPI: Gregory Grant is a 58 y.o. male with PMH of CAD ST MI in 2020 with DES to RCA, DES to circumflex in 2025 for repeat ST EMI, HTN, HLD, HIV, PAD, tobacco abuse, recent emergent LHC and recent echo with EF 55 to 60% presented with chest pain that woke him up from sleep.  Pain described sharp right central side of the chest 10/10 with associated shortness of breath vomiting some diarrhea.  Had some relief with nitroglycerin . Currently no chest pain/ Patient otherwise denies any fever, chills, headache, focal weakness, numbness tingling, speech difficulties  He has not slept since last night 11 pm and feels tired. In the ED: BP on higher side 170s, diastolic 100, EKG sinus rhythm apparent evolving ST elevation changes in the inferior leads, new high lateral T wave inversion troponin bumped to 1813.  Other labs showed mild hyponatremia stable LFTs CBC Chest x-ray no active disease cardiology was consulted. Admission requested for NSTEMI   Assessment and plan:   NSTEMI CAD status post PCI to the RCA and recent Lcx: Input appreciated from cardiology patient was admitted in the progressive bed, continue heparin  drip nitroglycerin  drip along with the rest of the cardiac meds.  PTA aspirin  81,, Zetia , Effient , Crestor  40 . Coreg  and will resume. Further plan per cardiolog  HIV disease: Controlled, last CD4 1547 in August/26,continue home Biktarvy    Essential hypertension Hypertensive urgency: BP poorly controlled.  Starting NTG drip. Resume rest of the meds per cardiology-see #1  PAD Dyslipidemia Tobacco abuse: Advised tobacco cessation.  Continue patient's antiplatelet statin as per #1. He agrees to quit smoking like his wife told him.  DVT prophylaxis: heparin  gtt Code Status:   Code Status: Full  Code Family Communication: plan of care discussed with patient at bedside. Patient status is: Remains hospitalized because of severity of illness Level of care: Progressive   Dispo: The patient is from: home  Objective: Vitals last 24 hrs: Vitals:   09/05/24 1115 09/05/24 1130 09/05/24 1145 09/05/24 1530  BP:  (!) 170/103  (!) 159/113  Pulse: (!) 56 (!) 54 62 (!) 54  Resp: 13 18 13 11   Temp:    98.2 F (36.8 C)  TempSrc:    Oral  SpO2: 98% 98% 99% 97%  Weight:      Height:        Physical Examination: General exam: alert awake, oriented, older than stated age HEENT:Oral mucosa moist, Ear/Nose WNL grossly Respiratory system: Bilaterally clear BS,no use of accessory muscle Cardiovascular system: S1 & S2 +, No JVD. Gastrointestinal system: Abdomen soft,NT,ND, BS+ Nervous System: Alert, awake, moving all extremities,and following commands. Extremities: extremities warm, leg edema neg Skin: No rashes,no icterus. MSK: Normal muscle bulk,tone, power   Medications reviewed:  Scheduled Meds:  [START ON 09/06/2024] aspirin  EC  81 mg Oral Daily   [START ON 09/06/2024] bictegravir-emtricitabine -tenofovir  AF  1 tablet Oral Daily   carvedilol   6.25 mg Oral BID   [START ON 09/06/2024] ezetimibe   10 mg Oral Daily   prasugrel   10 mg Oral Daily   [START ON 09/06/2024] rosuvastatin   40 mg Oral Daily   Continuous Infusions:  nitroGLYCERIN      Diet: Diet Order             Diet NPO time specified  Diet effective now                     Severity of Illness: The appropriate patient status for this patient is INPATIENT. Inpatient status is judged to be reasonable and necessary in order to provide the required intensity of service to ensure the patient's safety. The patient's presenting symptoms, physical exam findings, and initial radiographic and laboratory data in the context of their chronic comorbidities is felt to place them at high risk for further clinical deterioration.  Furthermore, it is not anticipated that the patient will be medically stable for discharge from the hospital within 2 midnights of admission.   * I certify that at the point of admission it is my clinical judgment that the patient will require inpatient hospital care spanning beyond 2 midnights from the point of admission due to high intensity of service, high risk for further deterioration and high frequency of surveillance required.*  Family Communication: Admission, patients condition and plan of care including tests being ordered have been discussed with the patient  who indicate understanding and agree with the plan and Code Status.  Consults called:  cardiology  Review of Systems: All systems were reviewed and were negative except as mentioned in HPI above. Negative for fever Negative for focal weakness Negative for shortness of breath  Past Medical History:  Diagnosis Date   Coughing 02/2015   GERD (gastroesophageal reflux disease)    HIV (human immunodeficiency virus infection) (HCC)    Peripheral arterial disease     Past Surgical History:  Procedure Laterality Date   ABDOMINAL AORTOGRAM N/A 05/13/2024   Procedure: ABDOMINAL AORTOGRAM;  Surgeon: Magda Debby SAILOR, MD;  Location: [facility] INVASIVE CV LAB;  Service: Cardiovascular;  Laterality: N/A;   CORONARY STENT INTERVENTION N/A 06/24/2019   Procedure: CORONARY STENT INTERVENTION;  Surgeon: Anner Alm ORN, MD;  Location: [facility] INVASIVE CV LAB;  Service: Cardiovascular;  Laterality: N/A;   CORONARY/GRAFT ACUTE MI REVASCULARIZATION N/A 06/24/2019   Procedure: Coronary/Graft Acute MI Revascularization;  Surgeon: Anner Alm ORN, MD;  Location: [facility] INVASIVE CV LAB;  Service: Cardiovascular;  Laterality: N/A;   CORONARY/GRAFT ACUTE MI REVASCULARIZATION N/A 04/12/2024   Procedure: Coronary/Graft Acute MI Revascularization;  Surgeon: Elmira Newman PARAS, MD;  Location: [facility] INVASIVE CV LAB;  Service: Cardiovascular;  Laterality: N/A;   LEFT  HEART CATH AND CORONARY ANGIOGRAPHY N/A 06/24/2019   Procedure: LEFT HEART CATH AND CORONARY ANGIOGRAPHY;  Surgeon: Anner Alm ORN, MD;  Location: [facility] INVASIVE CV LAB;  Service: Cardiovascular;  Laterality: N/A;   LEFT HEART CATH AND CORONARY ANGIOGRAPHY N/A 04/12/2024   Procedure: LEFT HEART CATH AND CORONARY ANGIOGRAPHY;  Surgeon: Elmira Newman PARAS, MD;  Location: [facility] INVASIVE CV LAB;  Service: Cardiovascular;  Laterality: N/A;   LOWER EXTREMITY ANGIOGRAPHY Left 05/13/2024   Procedure: Lower Extremity Angiography;  Surgeon: Magda Debby SAILOR, MD;  Location: [facility] INVASIVE CV LAB;  Service: Cardiovascular;  Laterality: Left;   LOWER EXTREMITY INTERVENTION Left 05/13/2024   Procedure: LOWER EXTREMITY INTERVENTION;  Surgeon: Magda Debby SAILOR, MD;  Location: [facility] INVASIVE CV LAB;  Service: Cardiovascular;  Laterality: Left;     reports that he has been smoking cigarettes. He has a 15 pack-year smoking history. He has never used smokeless tobacco. He reports current alcohol use of about 1.0 standard drink of alcohol per week. He reports that he does not use drugs.  No Known Allergies  Family History  Problem Relation Age of Onset   Dementia Mother  Diabetes Father      Prior to Admission medications   Medication Sig Start Date End Date Taking? Authorizing Provider  aspirin  EC 81 MG tablet Take 1 tablet (81 mg total) by mouth daily. Swallow whole. 07/11/24  Yes Darryle Currier L, PA-C  bictegravir-emtricitabine -tenofovir  AF (BIKTARVY ) 50-200-25 MG TABS tablet Take 1 tablet by mouth daily. 02/19/24  Yes Comer, Lamar ORN, MD  carvedilol  (COREG ) 6.25 MG tablet Take 1 tablet (6.25 mg total) by mouth 2 (two) times daily. Patient taking differently: Take 6.25 mg by mouth daily. 08/10/24  Yes Darryle Currier L, PA-C  ezetimibe  (ZETIA ) 10 MG tablet Take 1 tablet (10 mg total) by mouth daily. 08/10/24 11/08/24 Yes Darryle Currier L, PA-C  nitroGLYCERIN  (NITROSTAT ) 0.4 MG SL tablet Place 1 tablet (0.4 mg total) under the  tongue every 5 (five) minutes as needed for chest pain. 07/11/24  Yes Darryle Currier L, PA-C  prasugrel  (EFFIENT ) 10 MG TABS tablet Take 1 tablet (10 mg total) by mouth daily. 07/11/24  Yes Darryle Currier L, PA-C  rosuvastatin  (CRESTOR ) 40 MG tablet Take 1 tablet (40 mg total) by mouth daily. 07/11/24  Yes Darryle Currier L, PA-C  famotidine  (PEPCID ) 20 MG tablet Take 1 tablet (20 mg total) by mouth 2 (two) times daily. To protect stomach 10/11/20 12/25/20  Alec House, MD   r   Labs on Admission: I have personally reviewed following labs and imaging studies  CBC: Recent Labs  Lab 09/05/24 0938 09/05/24 0941  WBC  --  7.6  NEUTROABS  --  4.2  HGB 16.0 15.6  HCT 47.0 46.1  MCV  --  100.9*  PLT  --  169   Basic Metabolic Panel: Recent Labs  Lab 09/05/24 0938 09/05/24 0941  NA 136 132*  K 4.3 4.3  CL 107 105  CO2  --  16*  GLUCOSE 95 93  BUN 9 10  CREATININE 1.00 1.08  CALCIUM   --  8.6*   Estimated Creatinine Clearance: 66.4 mL/min (by C-G formula based on SCr of 1.08 mg/dL). Recent Labs  Lab 09/05/24 0941  AST 44*  ALT 25  ALKPHOS 77  BILITOT 0.9  PROT 6.9  ALBUMIN 3.7  Cardiac Panel (last 3 results) Recent Labs    09/05/24 0928  TROPONINIHS 1,813*  No results for input(s): TSH, T4TOTAL, FREET4, T3FREE, THYROIDAB in the last 72 hours. Urine analysis:    Component Value Date/Time   COLORURINE YELLOW 11/01/2020 1201   APPEARANCEUR CLEAR 11/01/2020 1201   LABSPEC 1.013 11/01/2020 1201   PHURINE 7.0 11/01/2020 1201   GLUCOSEU NEGATIVE 11/01/2020 1201   HGBUR NEGATIVE 11/01/2020 1201   BILIRUBINUR NEGATIVE 11/01/2020 1201   KETONESUR 5 (A) 11/01/2020 1201   PROTEINUR NEGATIVE 11/01/2020 1201   UROBILINOGEN 0.2 03/14/2015 0948   NITRITE NEGATIVE 11/01/2020 1201   LEUKOCYTESUR NEGATIVE 11/01/2020 1201    Radiological Exams on Admission: DG Chest 2 View Result Date: 09/05/2024 CLINICAL DATA:  Substernal chest pain beginning last night. Nonproductive cough.  EXAM: CHEST - 2 VIEW COMPARISON:  04/12/2024 FINDINGS: Lungs are adequately inflated without focal airspace consolidation or effusion. Cardiomediastinal silhouette and remainder of the exam is unchanged. IMPRESSION: No active cardiopulmonary disease. Electronically Signed   By: Toribio Agreste M.D.   On: 09/05/2024 10:33      Mennie LAMY MD [facility]  If 7PM-7AM, please contact night-coverage [email or web address]  09/05/2024, 4:37 PM

## 2024-09-06 ENCOUNTER — Encounter (HOSPITAL_COMMUNITY)
Admission: EM | Disposition: A | Discharge status: Home / Self Care | Payer: Self-pay | Source: Home / Self Care | Attending: Emergency Medicine

## 2024-09-06 DIAGNOSIS — I2510 Atherosclerotic heart disease of native coronary artery without angina pectoris: Secondary | ICD-10-CM

## 2024-09-06 DIAGNOSIS — E782 Mixed hyperlipidemia: Secondary | ICD-10-CM

## 2024-09-06 DIAGNOSIS — I10 Essential (primary) hypertension: Secondary | ICD-10-CM | POA: Diagnosis not present

## 2024-09-06 DIAGNOSIS — I214 Non-ST elevation (NSTEMI) myocardial infarction: Secondary | ICD-10-CM | POA: Diagnosis not present

## 2024-09-06 HISTORY — PX: LEFT HEART CATH AND CORONARY ANGIOGRAPHY: CATH118249

## 2024-09-06 LAB — LIPID PANEL
Cholesterol: 93 mg/dL (ref 0–200)
HDL: 32 mg/dL — ABNORMAL LOW (ref >40)
LDL Cholesterol: 45 mg/dL (ref 0–99)
Total CHOL/HDL Ratio: 2.9 ratio
Triglycerides: 80 mg/dL (ref <150)
VLDL: 16 mg/dL (ref 0–40)

## 2024-09-06 LAB — HEPARIN LEVEL (UNFRACTIONATED)
Heparin Unfractionated: 0.22 [IU]/mL — ABNORMAL LOW (ref 0.30–0.70)
Heparin Unfractionated: 0.32 [IU]/mL (ref 0.30–0.70)

## 2024-09-06 LAB — COMPREHENSIVE METABOLIC PANEL WITH GFR
ALT: 23 U/L (ref 0–44)
AST: 74 U/L — ABNORMAL HIGH (ref 15–41)
Albumin: 3.3 g/dL — ABNORMAL LOW (ref 3.5–5.0)
Alkaline Phosphatase: 68 U/L (ref 38–126)
Anion gap: 10 (ref 5–15)
BUN: 8 mg/dL (ref 6–20)
CO2: 18 mmol/L — ABNORMAL LOW (ref 22–32)
Calcium: 8.5 mg/dL — ABNORMAL LOW (ref 8.9–10.3)
Chloride: 106 mmol/L (ref 98–111)
Creatinine, Ser: 1.1 mg/dL (ref 0.61–1.24)
GFR, Estimated: >60 mL/min (ref >60)
Glucose, Bld: 114 mg/dL — ABNORMAL HIGH (ref 70–99)
Potassium: 3.9 mmol/L (ref 3.5–5.1)
Sodium: 134 mmol/L — ABNORMAL LOW (ref 135–145)
Total Bilirubin: 0.9 mg/dL (ref 0.0–1.2)
Total Protein: 6.2 g/dL — ABNORMAL LOW (ref 6.5–8.1)

## 2024-09-06 LAB — CBC
HCT: 42.8 % (ref 39.0–52.0)
Hemoglobin: 14.7 g/dL (ref 13.0–17.0)
MCH: 33.8 pg (ref 26.0–34.0)
MCHC: 34.3 g/dL (ref 30.0–36.0)
MCV: 98.4 fL (ref 80.0–100.0)
Platelets: 160 K/uL (ref 150–400)
RBC: 4.35 MIL/uL (ref 4.22–5.81)
RDW: 13.7 % (ref 11.5–15.5)
WBC: 8.4 K/uL (ref 4.0–10.5)
nRBC: 0 % (ref 0.0–0.2)

## 2024-09-06 SURGERY — LEFT HEART CATH AND CORONARY ANGIOGRAPHY
Anesthesia: LOCAL

## 2024-09-06 MED ORDER — MIDAZOLAM HCL 2 MG/2ML IJ SOLN
INTRAMUSCULAR | Status: DC | PRN
  Administered 2024-09-06: 2 mg via INTRAVENOUS

## 2024-09-06 MED ORDER — LIDOCAINE HCL (PF) 1 % IJ SOLN
INTRAMUSCULAR | Status: DC | PRN
  Administered 2024-09-06: 5 mL via INTRADERMAL

## 2024-09-06 MED ORDER — FREE WATER
500.0000 mL | Freq: Once | Status: AC
  Administered 2024-09-06: 500 mL via ORAL

## 2024-09-06 MED ORDER — FENTANYL CITRATE (PF) 100 MCG/2ML IJ SOLN
INTRAMUSCULAR | Status: AC
  Filled 2024-09-06: qty 2

## 2024-09-06 MED ORDER — VERAPAMIL HCL 2.5 MG/ML IV SOLN
INTRAVENOUS | Status: DC | PRN
  Administered 2024-09-06: 10 mL via INTRA_ARTERIAL

## 2024-09-06 MED ORDER — SODIUM CHLORIDE 0.9% FLUSH
3.0000 mL | Freq: Two times a day (BID) | INTRAVENOUS | Status: DC
  Administered 2024-09-06: 3 mL via INTRAVENOUS

## 2024-09-06 MED ORDER — VERAPAMIL HCL 2.5 MG/ML IV SOLN
INTRAVENOUS | Status: AC
  Filled 2024-09-06: qty 2

## 2024-09-06 MED ORDER — SODIUM CHLORIDE 0.9% FLUSH: 3.0000 mL | INTRAVENOUS | Status: DC | PRN

## 2024-09-06 MED ORDER — SODIUM CHLORIDE 0.9 % IV SOLN: 250.0000 mL | INTRAVENOUS | Status: DC | PRN

## 2024-09-06 MED ORDER — LIDOCAINE HCL (PF) 1 % IJ SOLN
INTRAMUSCULAR | Status: AC
  Filled 2024-09-06: qty 30

## 2024-09-06 MED ORDER — FENTANYL CITRATE (PF) 100 MCG/2ML IJ SOLN
INTRAMUSCULAR | Status: DC | PRN
  Administered 2024-09-06: 25 ug via INTRAVENOUS

## 2024-09-06 MED ORDER — FREE WATER
500.0000 mL | Freq: Once | Status: AC
Start: 2024-09-06 — End: 2024-09-06
  Administered 2024-09-06: 500 mL via ORAL

## 2024-09-06 MED ORDER — MIDAZOLAM HCL 2 MG/2ML IJ SOLN
INTRAMUSCULAR | Status: AC
  Filled 2024-09-06: qty 2

## 2024-09-06 MED ORDER — ASPIRIN 81 MG PO CHEW
81.0000 mg | CHEWABLE_TABLET | ORAL | Status: AC
  Administered 2024-09-06: 81 mg via ORAL
  Filled 2024-09-06: qty 1

## 2024-09-06 MED ORDER — HEPARIN SODIUM (PORCINE) 1000 UNIT/ML IJ SOLN
INTRAMUSCULAR | Status: DC | PRN
  Administered 2024-09-06: 4000 [IU] via INTRAVENOUS

## 2024-09-06 MED ORDER — HEPARIN (PORCINE) IN NACL 1000-0.9 UT/500ML-% IV SOLN
INTRAVENOUS | Status: DC | PRN
  Administered 2024-09-06 (×2): 500 mL

## 2024-09-06 MED ORDER — ENOXAPARIN SODIUM 40 MG/0.4ML IJ SOSY: 40.0000 mg | PREFILLED_SYRINGE | INTRAMUSCULAR | Status: DC

## 2024-09-06 MED ORDER — IOHEXOL 350 MG/ML SOLN
INTRAVENOUS | Status: DC | PRN
  Administered 2024-09-06: 60 mL

## 2024-09-06 MED ORDER — HEPARIN SODIUM (PORCINE) 1000 UNIT/ML IJ SOLN
INTRAMUSCULAR | Status: AC
  Filled 2024-09-06: qty 10

## 2024-09-06 SURGICAL SUPPLY — 8 items
CATH 5FR JL3.5 JR4 ANG PIG MP (CATHETERS) IMPLANT
DEVICE RAD COMP TR BAND LRG (VASCULAR PRODUCTS) IMPLANT
GLIDESHEATH SLEND SS 6F .021 (SHEATH) IMPLANT
GUIDEWIRE INQWIRE 1.5J.035X260 (WIRE) IMPLANT
KIT SINGLE USE MANIFOLD (KITS) IMPLANT
PACK CARDIAC CATHETERIZATION (CUSTOM PROCEDURE TRAY) ×2 IMPLANT
SET ATX-X65L (MISCELLANEOUS) IMPLANT
SHEATH PROBE COVER 6X72 (BAG) IMPLANT

## 2024-09-06 NOTE — Progress Notes (Signed)
  Progress Note  Patient Name: Gregory Grant Date of Encounter: 09/06/2024 [facility] Cardiologist: Newman JINNY Lawrence, MD   Interval Summary    No further chest pain, frustrated about having to wait for cath.   Vital Signs Vitals:   09/05/24 2030 09/05/24 2100 09/06/24 0351 09/06/24 0731  BP: (!) 154/96 (!) 144/87 127/76 (!) 129/96  Pulse: 77 (!) 58 84 76  Resp: (!) 21 18 20 20   Temp:   99.2 F (37.3 C) 98.7 F (37.1 C)  TempSrc:   Oral Oral  SpO2:  95% 99% 97%  Weight:   67.1 kg   Height:        Intake/Output Summary (Last 24 hours) at 09/06/2024 0910 Last data filed at 09/06/2024 0730 Gross per 24 hour  Intake 500 ml  Output 700 ml  Net -200 ml      09/06/2024    3:51 AM 09/05/2024    9:30 AM 08/23/2024    9:53 AM  Last 3 Weights  Weight (lbs) 147 lb 14.9 oz 148 lb 175 lb  Weight (kg) 67.1 kg 67.132 kg 79.379 kg      Telemetry/ECG   Sinus Rhythm - Personally Reviewed  Physical Exam  GEN: No acute distress.   Neck: No JVD Cardiac: RRR, no murmurs, rubs, or gallops.  Respiratory: Clear to auscultation bilaterally. GI: Soft, nontender, non-distended  MS: No edema  Assessment & Plan   58 y.o. male with a hx of CAD s/p STEMI 2020 (DES-RCA) and 2025 (DES-LCX), dilated aortic root, HTN, HLD, PAD, and HIV stable on biktarvy  who was seen 09/05/2024 for the evaluation of chest pain at the request of Dr. Caron.   NSTEMI CAD s/p stenting of the RCA/Lcx -- presented with symptoms concerning for unstable angina. Now on IV heparin  and NTG with resolution of chest pain -- EKG showed new TWI in inferolateral leads  -- hsTn 1813>>4380 -- planned for cardiac cath today  -- continue ASA, Effient , Crestor , Zetia , coreg    HTN -- improved today -- continue coreg  6.25mg  BID, IV NTG  HLD -- LDL 45, HDL 32 -- continue Crestor  40mg  daily, zetia  10mg  daily  HIV Tobacco use  For questions or updates, please contact [facility] Please consult  [email or web address] for contact info under    Signed, Manuelita Rummer, NP

## 2024-09-06 NOTE — Progress Notes (Signed)
 PROGRESS NOTE Gregory Grant  FMW:[id number] DOB: 13-Apr-1966 DOA: 09/05/2024 PCP: Pcp, No  Brief Narrative/Hospital Course: Gregory Grant is a 58 y.o. male with PMH of CAD ST MI in 2020 with DES to RCA, DES to circumflex in 2025 for repeat ST EMI, HTN, HLD, HIV, PAD, tobacco abuse, recent emergent LHC and recent echo with EF 55 to 60% presented with chest pain that woke him up from sleep.  Pain described sharp right central side of the chest 10/10 with associated shortness of breath vomiting some diarrhea.  Had some relief with nitroglycerin . Currently no chest pain/ Patient otherwise denies any fever, chills, headache, focal weakness, numbness tingling, speech difficulties  He has not slept since last night 11 pm and feels tired. In the ED: BP on higher side 170s, diastolic 100, EKG sinus rhythm apparent evolving ST elevation changes in the inferior leads, new high lateral T wave inversion troponin bumped to 1813.  Other labs showed mild hyponatremia stable LFTs CBC Chest x-ray no active disease cardiology was consulted. Admission requested for NSTEMI> admitted on IV nitroglycerin  drip and heparin  drip.   Subjective: Seen and examined today No chest pain Does not like the bed he reports he is going home after cardiac cath Overnight heart rate 50s-60s, BP 120s-150s, not hypoxic Labs reviewed bicarb of 18 LDL 45 stable CBC  Assessment and plan:  NSTEMI CAD status post PCI to the RCA and recent Lcx: Hs trops 8186>5619.  Cardiology following closely on heparin  and nitroglycerin  drip.  Continue home aspirin  81 Effient  Coreg  Zetia  and Crestor .  Plan is for cardiac cath  HIV disease: Controlled, last CD4 1547 in August/26. Continue home Biktarvy    Essential hypertension Hypertensive urgency: BP was poorly controlled on admit.  Fairly stable continue home carvedilol .  Continue nitroglycerin   PAD Dyslipidemia Tobacco abuse: Advised tobacco cessation.  Continue statins ,antiplatelet as  per #1.  DVT prophylaxis: heparin  gtt Code Status:   Code Status: Full Code Family Communication: plan of care discussed with patient at bedside. Patient status is: Remains hospitalized because of severity of illness Level of care: Progressive   Dispo: The patient is from: home            Anticipated disposition: TBD Objective: Vitals last 24 hrs: Vitals:   09/05/24 2030 09/05/24 2100 09/06/24 0351 09/06/24 0731  BP: (!) 154/96 (!) 144/87 127/76 (!) 129/96  Pulse: 77 (!) 58 84 76  Resp: (!) 21 18 20 20   Temp:   99.2 F (37.3 C) 98.7 F (37.1 C)  TempSrc:   Oral Oral  SpO2:  95% 99% 97%  Weight:   67.1 kg   Height:        Physical Examination: General exam: alert awake, oriented x 3 HEENT:Oral mucosa moist, Ear/Nose WNL grossly Respiratory system: Bilaterally clear BS,no use of accessory muscle Cardiovascular system: S1 & S2 +, No JVD. Gastrointestinal system: Abdomen soft,NT,ND, BS+ Nervous System: Alert, awake, moving all extremities,and following commands. Extremities: extremities warm, leg edema neg Skin: No rashes,no icterus. MSK: Normal muscle bulk,tone, power   Medications reviewed:  Scheduled Meds:  aspirin  EC  81 mg Oral Daily   bictegravir-emtricitabine -tenofovir  AF  1 tablet Oral Daily   carvedilol   6.25 mg Oral BID WC   ezetimibe   10 mg Oral Daily   prasugrel   10 mg Oral Daily   rosuvastatin   40 mg Oral Daily   Continuous Infusions:  heparin  1,300 Units/hr (09/06/24 0450)   nitroGLYCERIN  40 mcg/min (09/05/24 2042)   Diet: Diet Order  Diet NPO time specified Except for: Sips with Meds  Diet effective 0500                    Data Reviewed: I have personally reviewed following labs and imaging studies ( see epic result tab) CBC: Recent Labs  Lab 09/05/24 0938 09/05/24 0941 09/06/24 0247  WBC  --  7.6 8.4  NEUTROABS  --  4.2  --   HGB 16.0 15.6 14.7  HCT 47.0 46.1 42.8  MCV  --  100.9* 98.4  PLT  --  169 160   CMP: Recent  Labs  Lab 09/05/24 0938 09/05/24 0941 09/06/24 0247  NA 136 132* 134*  K 4.3 4.3 3.9  CL 107 105 106  CO2  --  16* 18*  GLUCOSE 95 93 114*  BUN 9 10 8   CREATININE 1.00 1.08 1.10  CALCIUM   --  8.6* 8.5*   GFR: Estimated Creatinine Clearance: 65.2 mL/min (by C-G formula based on SCr of 1.1 mg/dL). Recent Labs  Lab 09/05/24 0941 09/06/24 0247  AST 44* 74*  ALT 25 23  ALKPHOS 77 68  BILITOT 0.9 0.9  PROT 6.9 6.2*  ALBUMIN 3.7 3.3*   No results for input(s): LIPASE, AMYLASE in the last 168 hours. No results for input(s): AMMONIA in the last 168 hours. Coagulation Profile: No results for input(s): INR, PROTIME in the last 168 hours. Unresulted Labs (From admission, onward)     Start     Ordered   09/07/24 0500  Basic metabolic panel with GFR  Daily,   R      09/06/24 0824   09/06/24 1100  Heparin  level (unfractionated)  Once-Timed,   TIMED        09/06/24 0421   09/06/24 0500  CBC  Daily,   R      09/05/24 1642   09/06/24 0500  Heparin  level (unfractionated)  Daily,   R      09/05/24 1642           Antimicrobials/Microbiology: Anti-infectives (From admission, onward)    Start     Dose/Rate Route Frequency Ordered Stop   09/06/24 1000  bictegravir-emtricitabine -tenofovir  AF (BIKTARVY ) 50-200-25 MG per tablet 1 tablet        1 tablet Oral Daily 09/05/24 1635           Component Value Date/Time   SDES ABSCESS RIGHT BREAST 11/01/2020 1529   SDES RIGHT BREAST 11/01/2020 1529   SPECREQUEST SYRINGE 11/01/2020 1529   SPECREQUEST NONE 11/01/2020 1529   CULT  11/01/2020 1529    RARE DIPHTHEROIDS(CORYNEBACTERIUM SPECIES) RARE PEPTOSTREPTOCOCCUS SPECIES Standardized susceptibility testing for this organism is not available. Performed at [facility], [address], [place], [place] [address]    CULT  11/01/2020 1529    TEST WILL BE CREDITED Performed at [facility], [address] [place], [place] [address]    REPTSTATUS 11/06/2020 FINAL  11/01/2020 1529   REPTSTATUS 11/06/2020 FINAL 11/01/2020 1529    Procedures: Procedure(s) (LRB): LEFT HEART CATH AND CORONARY ANGIOGRAPHY (N/A) Mennie LAMY, MD [facility] 09/06/2024, 10:19 AM

## 2024-09-06 NOTE — Interval H&P Note (Signed)
 History and Physical Interval Note:  09/06/2024 10:15 AM  Gregory Grant  has presented today for surgery, with the diagnosis of Unstable Angina.  The various methods of treatment have been discussed with the patient and family. After consideration of risks, benefits and other options for treatment, the patient has consented to  Procedure(s): LEFT HEART CATH AND CORONARY ANGIOGRAPHY (N/A) as a surgical intervention.  The patient's history has been reviewed, patient examined, no change in status, stable for surgery.  I have reviewed the patient's chart and labs.  Questions were answered to the patient's satisfaction.   Cath Lab Visit (complete for each Cath Lab visit)  Clinical Evaluation Leading to the Procedure:   ACS: Yes.    Non-ACS:    Anginal Classification: CCS IV  Anti-ischemic medical therapy: Minimal Therapy (1 class of medications)  Non-Invasive Test Results: No non-invasive testing performed  Prior CABG: No previous CABG        Maude [facility] 09/06/2024 10:15 AM

## 2024-09-06 NOTE — TOC CM/SW Note (Signed)
 Transition of Care [facility]) - Inpatient Brief Assessment   Patient Details  Name: Gregory Grant MRN: [id number] Date of Birth: 11-Apr-1966  [facility] of [facility]) CM/SW Contact:    Waddell Barnie Rama, RN Phone Number: 09/06/2024, 1:51 PM   Clinical Narrative: From home alone, indep, has PCP  at [facility] clinic and insurance on file, states has no HH services in place at this time or DME at home.  States a friend will transport them home at [employer] and friend is support system, states gets medications from [facility] on [address].  Pta self ambulatory.   There are no ICM needs identified  at this time.  Please place consult for ICM needs.     Transition of Care Asessment: Insurance and Status: Insurance coverage has been reviewed Patient has primary care physician: Yes Home environment has been reviewed: home alone, but will be staying with his girlfriend Prior level of function:: indep Prior/Current Home Services: No current home services Social Drivers of Health Review: SDOH reviewed no interventions necessary Readmission risk has been reviewed: Yes Transition of care needs: no transition of care needs at this time

## 2024-09-06 NOTE — Progress Notes (Signed)
 PHARMACY - ANTICOAGULATION CONSULT NOTE  Pharmacy Consult for heparin  Indication: chest pain/ACS  No Known Allergies  Patient Measurements: Height: 5' 5 (165.1 cm) Weight: 67.1 kg (147 lb 14.9 oz) IBW/kg (Calculated) : 61.5 HEPARIN  DW (KG): 67.1  Vital Signs: Temp: 99.2 F (37.3 C) (09/23 0351) Temp Source: Oral (09/23 0351) BP: 127/76 (09/23 0351) Pulse Rate: 84 (09/23 0351)  Labs: Recent Labs    09/05/24 0928 09/05/24 9061 09/05/24 0938 09/05/24 0941 09/05/24 1132 09/05/24 2236 09/06/24 0247  HGB  --  16.0   < > 15.6  --   --  14.7  HCT  --  47.0  --  46.1  --   --  42.8  PLT  --   --   --  169  --   --  160  HEPARINUNFRC  --   --   --   --   --  0.26* 0.22*  CREATININE  --  1.00  --  1.08  --   --  1.10  TROPONINIHS 1,813*  --   --   --  4,380*  --   --    < > = values in this interval not displayed.    Estimated Creatinine Clearance: 65.2 mL/min (by C-G formula based on SCr of 1.1 mg/dL).   Assessment: 58 y.o. M presents with NSTEMI. To begin heparin . No AC PTA. CBC stable.  3 hr heparin  level is trending down and subtherapeutic at 0.22 on 1150 units/hr. No bleeding noted, CBC stable.   Goal of Therapy:  Heparin  level 0.3-0.7 units/ml Monitor platelets by anticoagulation protocol: Yes   Plan:  Increase heparin  drip to 1300 units/hr 6h heparin  level Daily heparin  level and CBC Monitor for s/sx of bleeding  Thank you for involving pharmacy in this patient's care.  Delon Sax, PharmD, BCPS Clinical Pharmacist 09/06/2024 4:19 AM

## 2024-09-06 NOTE — Plan of Care (Signed)

## 2024-09-06 NOTE — Progress Notes (Signed)
 Discharge   Pt verbally understands discharge instruction.   Additional education included in AVS.   PIV and tele removed.CCMD/Katrina Patient transporting to discharge lounge.

## 2024-09-06 NOTE — TOC Transition Note (Signed)
 Transition of Care [facility]) - Discharge Note   Patient Details  Name: Gregory Grant MRN: [id number] Date of Birth: [birth year]  Transition of Care [facility]) CM/SW Contact:  Waddell Barnie Rama, RN Phone Number: 09/06/2024, 1:53 PM   Clinical Narrative:    For dc home today, has no needs.         Patient Goals and CMS Choice            Discharge Placement                       Discharge Plan and Services Additional resources added to the After Visit Summary for                                       Social Drivers of Health (SDOH) Interventions SDOH Screenings   Depression (PHQ2-9): Medium Risk (08/23/2024)  Tobacco Use: High Risk (09/05/2024)     Readmission Risk Interventions    09/06/2024    1:51 PM  Readmission Risk Prevention Plan  Post Dischage Appt Complete  Medication Screening Complete  Transportation Screening Complete

## 2024-09-06 NOTE — Progress Notes (Signed)
 Pt was educated on NSTEMI,  wt restrictions, no baths/daily wash-ups, s/s of infection, ex guidelines, s/s to stop exercising, NTG use and calling 911, heart healthy diet, risk factors and CRPII. Pt received MI book and materials on exercise, diet, and CRPII. Will refer to [facility].   Pt unlikely to join CR program as he believes he gets enough exercise by walking.   Garen FORBES Candy MS, ACSM-CEP 09/06/2024 2:25 PM

## 2024-09-06 NOTE — Discharge Summary (Signed)
 Physician Discharge Summary  Gregory Grant FMW:[id number] DOB: Mar 13, 1966 DOA: 09/05/2024  PCP: Pcp, No  Admit date: 09/05/2024 Discharge date: 09/06/2024 Recommendations for Outpatient Follow-up:  Follow up with PCP in 1 weeks-call for appointment Please obtain BMP/CBC in one week  Discharge Dispo: Home Discharge Condition: Stable Code Status:   Code Status: Full Code Diet recommendation:  Diet Order             Diet Heart Room service appropriate? Yes; Fluid consistency: Thin  Diet effective now           Diet - low sodium heart healthy                    Brief/Interim Summary: Gregory Grant is a 58 y.o. male with PMH of CAD ST MI in 2020 with DES to RCA, DES to circumflex in 2025 for repeat ST EMI, HTN, HLD, HIV, PAD, tobacco abuse, recent emergent LHC and recent echo with EF 55 to 60% presented with chest pain that woke him up from sleep.  Pain described sharp right central side of the chest 10/10 with associated shortness of breath vomiting some diarrhea.  Had some relief with nitroglycerin . Currently no chest pain/ Patient otherwise denies any fever, chills, headache, focal weakness, numbness tingling, speech difficulties  He has not slept since last night 11 pm and feels tired. In the ED: BP on higher side 170s, diastolic 100, EKG sinus rhythm apparent evolving ST elevation changes in the inferior leads, new high lateral T wave inversion troponin bumped to 1813.  Other labs showed mild hyponatremia stable LFTs CBC Chest x-ray no active disease cardiology was consulted. Admission requested for NSTEMI> admitted on IV nitroglycerin  drip and heparin  drip. S/p LHC>Occluded Lcx stent that he put couple months ago. No benefit in intervening, likely completed infarct.CARDIO IS OK W/ Discharge and they will arrange follow up  as outpatient.  Subjective: Seen and examined today No chest pain Does not like the bed he reports he is going home after cardiac cath Overnight heart  rate 50s-60s, BP 120s-150s, not hypoxic Labs reviewed bicarb of 18 LDL 45 stable CBC  Discharge Diagnosis:  NSTEMI CAD status post PCI to the RCA and recent Lcx: Hs trops 8186>5619.  Cardiology following closely on heparin  and nitroglycerin  drip.  S/p LHC>Occluded Lcx stent that he put couple months ago. No benefit in intervening, likely completed infarct.CARDIO IS OK W/ Discharge and they will arrange follow up  as outpatient. Continue home aspirin  81 Effient  Coreg  Zetia  and Crestor .  HIV disease: Controlled, last CD4 1547 in August/26. Continue home Biktarvy    Essential hypertension Hypertensive urgency: BP was poorly controlled on admit.  Fairly stable continue home carvedilol .  Continue nitroglycerin   PAD Dyslipidemia Tobacco abuse: Advised tobacco cessation.  Continue statins ,antiplatelet as per #1.  DVT prophylaxis: heparin  gtt Code Status:   Code Status: Full Code Family Communication: plan of care discussed with patient at bedside. Patient status is: Remains hospitalized because of severity of illness Level of care: Progressive   Dispo: The patient is from: home            Anticipated disposition: home today Objective: Vitals last 24 hrs: Vitals:   09/05/24 2030 09/05/24 2100 09/06/24 0351 09/06/24 0731  BP: (!) 154/96 (!) 144/87 127/76 (!) 129/96  Pulse: 77 (!) 58 84 76  Resp: (!) 21 18 20 20   Temp:   99.2 F (37.3 C) 98.7 F (37.1 C)  TempSrc:   Oral Oral  SpO2:  95% 99% 97%  Weight:   67.1 kg   Height:        Physical Examination: General exam: alert awake, oriented x 3 HEENT:Oral mucosa moist, Ear/Nose WNL grossly Respiratory system: Bilaterally clear BS,no use of accessory muscle Cardiovascular system: S1 & S2 +, No JVD. Gastrointestinal system: Abdomen soft,NT,ND, BS+ Nervous System: Alert, awake, moving all extremities,and following commands. Extremities: extremities warm, leg edema neg Skin: No rashes,no icterus. MSK: Normal muscle bulk,tone,  power   Medications reviewed:  Scheduled Meds:  aspirin  EC  81 mg Oral Daily   bictegravir-emtricitabine -tenofovir  AF  1 tablet Oral Daily   carvedilol   6.25 mg Oral BID WC   ezetimibe   10 mg Oral Daily   prasugrel   10 mg Oral Daily   rosuvastatin   40 mg Oral Daily   Continuous Infusions:  heparin  1,300 Units/hr (09/06/24 0450)   nitroGLYCERIN  40 mcg/min (09/05/24 2042)   Diet: Diet Order             Diet NPO time specified Except for: Sips with Meds  Diet effective 0500                    Procedure(s) (LRB): LEFT HEART CATH AND CORONARY ANGIOGRAPHY (N/A)  Consultation: See note.  Discharge Instructions  Discharge Instructions     AMB referral to Phase II Cardiac Rehabilitation   Complete by: As directed    Diagnosis: NSTEMI   After initial evaluation and assessments completed: Virtual Based Care may be provided alone or in conjunction with Phase 2 Cardiac Rehab based on patient barriers.: Yes   Intensive Cardiac Rehabilitation (ICR) [facility] location only OR Traditional Cardiac Rehabilitation (TCR) *If criteria for ICR are not met will enroll in TCR [facility] only): Yes   Diet - low sodium heart healthy   Complete by: As directed    Discharge instructions   Complete by: As directed    Please call call MD or return to ER for similar or worsening recurring problem that brought you to hospital or if any fever,nausea/vomiting,abdominal pain, uncontrolled pain, chest pain,  shortness of breath or any other alarming symptoms.  Please follow-up your doctor as instructed in a week time and call the office for appointment.  Please avoid alcohol, smoking, or any other illicit substance and maintain healthy habits including taking your regular medications as prescribed.  You were cared for by a hospitalist during your hospital stay. If you have any questions about your discharge medications or the care you received while you were in the hospital after you are discharged, you can call  the unit and ask to speak with the hospitalist on call if the hospitalist that took care of you is not available.  Once you are discharged, your primary care physician will handle any further medical issues. Please note that NO REFILLS for any discharge medications will be authorized once you are discharged, as it is imperative that you return to your primary care physician (or establish a relationship with a primary care physician if you do not have one) for your aftercare needs so that they can reassess your need for medications and monitor your lab values   Increase activity slowly   Complete by: As directed       Allergies as of 09/06/2024   No Known Allergies      Medication List     TAKE these medications    aspirin  EC 81 MG tablet Take 1 tablet (81 mg total) by mouth daily. Swallow  whole.   Biktarvy  50-200-25 MG Tabs tablet Generic drug: bictegravir-emtricitabine -tenofovir  AF Take 1 tablet by mouth daily.   carvedilol  6.25 MG tablet Commonly known as: COREG  Take 1 tablet (6.25 mg total) by mouth 2 (two) times daily. What changed: when to take this   ezetimibe  10 MG tablet Commonly known as: ZETIA  Take 1 tablet (10 mg total) by mouth daily.   nitroGLYCERIN  0.4 MG SL tablet Commonly known as: NITROSTAT  Place 1 tablet (0.4 mg total) under the tongue every 5 (five) minutes as needed for chest pain.   prasugrel  10 MG Tabs tablet Commonly known as: EFFIENT  Take 1 tablet (10 mg total) by mouth daily.   rosuvastatin  40 MG tablet Commonly known as: CRESTOR  Take 1 tablet (40 mg total) by mouth daily.        Follow-up Information     [facility] Follow up in 1 week(s).   Contact information: [address] [place]  [address] [phone]               No Known Allergies  The results of significant diagnostics from this hospitalization (including imaging, microbiology, ancillary and laboratory)  are listed below for reference.    Microbiology: No results found for this or any previous visit (from the past 240 hours).  Procedures/Studies: CARDIAC CATHETERIZATION Result Date: 09/06/2024   Dist RCA lesion is 40% stenosed.   Mid Cx-1 lesion is 100% stenosed.   Non-stenotic Mid Cx-2 lesion was previously treated.   Non-stenotic Mid Cx to Dist Cx lesion was previously treated.   Non-stenotic Mid RCA lesion was previously treated.   Non-stenotic Prox RCA lesion was previously treated.   There is mild left ventricular systolic dysfunction.   LV end diastolic pressure is normal.   The left ventricular ejection fraction is 45-50% by visual estimate. Single vessel occlusive CAD. The distal LCx is now occluded. No significant collaterals. Patent stents in RCA Inferobasal wall motion abnormality with overall mild LV dysfunction Normal LVEDP Plan: medical management.   DG Chest 2 View Result Date: 09/05/2024 CLINICAL DATA:  Substernal chest pain beginning last night. Nonproductive cough. EXAM: CHEST - 2 VIEW COMPARISON:  04/12/2024 FINDINGS: Lungs are adequately inflated without focal airspace consolidation or effusion. Cardiomediastinal silhouette and remainder of the exam is unchanged. IMPRESSION: No active cardiopulmonary disease. Electronically Signed   By: Toribio Agreste M.D.   On: 09/05/2024 10:33    Labs: BNP (last 3 results) No results for input(s): BNP in the last 8760 hours. Basic Metabolic Panel: Recent Labs  Lab 09/05/24 0938 09/05/24 0941 09/06/24 0247  NA 136 132* 134*  K 4.3 4.3 3.9  CL 107 105 106  CO2  --  16* 18*  GLUCOSE 95 93 114*  BUN 9 10 8   CREATININE 1.00 1.08 1.10  CALCIUM   --  8.6* 8.5*   Liver Function Tests: Recent Labs  Lab 09/05/24 0941 09/06/24 0247  AST 44* 74*  ALT 25 23  ALKPHOS 77 68  BILITOT 0.9 0.9  PROT 6.9 6.2*  ALBUMIN 3.7 3.3*   No results for input(s): LIPASE, AMYLASE in the last 168 hours. No results for input(s): AMMONIA in the  last 168 hours. CBC: Recent Labs  Lab 09/05/24 0938 09/05/24 0941 09/06/24 0247  WBC  --  7.6 8.4  NEUTROABS  --  4.2  --   HGB 16.0 15.6 14.7  HCT 47.0 46.1 42.8  MCV  --  100.9* 98.4  PLT  --  169 160   CBG: No results for input(s): GLUCAP in the last 168 hours. Hgb A1c No results for input(s): HGBA1C in the last 72 hours. Anemia work up No results for input(s): VITAMINB12, FOLATE, FERRITIN, TIBC, IRON, RETICCTPCT in the last 72 hours. Cardiac Enzymes: No results for input(s): CKTOTAL, CKMB, CKMBINDEX, TROPONINI in the last 168 hours. BNP: Invalid input(s): POCBNP D-Dimer No results for input(s): DDIMER in the last 72 hours. Lipid Profile Recent Labs    09/06/24 0247  CHOL 93  HDL 32*  LDLCALC 45  TRIG 80  CHOLHDL 2.9   Thyroid function studies No results for input(s): TSH, T4TOTAL, T3FREE, THYROIDAB in the last 72 hours.  Invalid input(s): FREET3 Urinalysis    Component Value Date/Time   COLORURINE YELLOW 11/01/2020 1201   APPEARANCEUR CLEAR 11/01/2020 1201   LABSPEC 1.013 11/01/2020 1201   PHURINE 7.0 11/01/2020 1201   GLUCOSEU NEGATIVE 11/01/2020 1201   HGBUR NEGATIVE 11/01/2020 1201   BILIRUBINUR NEGATIVE 11/01/2020 1201   KETONESUR 5 (A) 11/01/2020 1201   PROTEINUR NEGATIVE 11/01/2020 1201   UROBILINOGEN 0.2 03/14/2015 0948   NITRITE NEGATIVE 11/01/2020 1201   LEUKOCYTESUR NEGATIVE 11/01/2020 1201   Sepsis Labs Recent Labs  Lab 09/05/24 0941 09/06/24 0247  WBC 7.6 8.4   Microbiology No results found for this or any previous visit (from the past 240 hours).   Time coordinating discharge: 25 minutes  SIGNED: Mennie LAMY, MD  [facility] 09/06/2024, 1:45 PM  If 7PM-7AM, please contact night-coverage [email or web address]

## 2024-09-07 ENCOUNTER — Encounter (HOSPITAL_COMMUNITY): Payer: Self-pay | Admitting: Cardiology

## 2024-09-08 ENCOUNTER — Telehealth (HOSPITAL_COMMUNITY): Payer: Self-pay

## 2024-09-08 NOTE — Telephone Encounter (Signed)
 Pt insurance is active and benefits verified through [facility] Co-pay $4, DED 0/0 met, out of pocket 0/0 met, co-insurance 0%. no pre-authorization required for TCR ONLY, Melanie/[employer]BS 09/08/24@10 :55, REF# [id number]  TCR/ICR? TCR Visit(date of service)limitation? 36 visits (High Risk) Can multiple codes be used on the same date of service/visit?(IF ITS A LIMIT) yes  Is this a lifetime maximum or an annual maximum? annual Has the member used any of these services to date? no Is there a time limit (weeks/months) on start of program and/or program completion? no  Will contact patient to see if he is interested in the Cardiac Rehab Program. If interested, patient will need to complete follow up appt. Once completed, patient will be contacted for scheduling upon review by the RN Navigator.

## 2024-09-14 ENCOUNTER — Ambulatory Visit

## 2024-09-30 ENCOUNTER — Other Ambulatory Visit: Payer: Self-pay

## 2024-09-30 DIAGNOSIS — B20 Human immunodeficiency virus [HIV] disease: Secondary | ICD-10-CM

## 2024-09-30 MED ORDER — BIKTARVY 50-200-25 MG PO TABS: 1.0000 | ORAL_TABLET | Freq: Every day | ORAL | 6 refills | Status: AC

## 2024-10-11 ENCOUNTER — Ambulatory Visit: Attending: Cardiology | Admitting: Cardiology

## 2024-10-11 ENCOUNTER — Telehealth (HOSPITAL_COMMUNITY): Payer: Self-pay | Admitting: *Deleted

## 2024-10-11 ENCOUNTER — Encounter: Payer: Self-pay | Admitting: Cardiology

## 2024-10-11 VITALS — BP 146/96 | HR 78 | Ht 67.0 in | Wt 176.8 lb

## 2024-10-11 DIAGNOSIS — I739 Peripheral vascular disease, unspecified: Secondary | ICD-10-CM | POA: Insufficient documentation

## 2024-10-11 DIAGNOSIS — I2510 Atherosclerotic heart disease of native coronary artery without angina pectoris: Secondary | ICD-10-CM | POA: Insufficient documentation

## 2024-10-11 NOTE — Progress Notes (Signed)
 Cardiology Office Note:  .   Date:  10/11/2024  ID:  Gregory Grant, DOB 10-Oct-1966, MRN [id number] PCP: Pcp, No  [facility] Providers Cardiologist:  Newman Lawrence, MD PCP: Pcp, No  Chief Complaint  Patient presents with   Coronary Artery Disease     Gregory Grant is a 58 y.o. male with HIV on treatment, nicotine dependence, CAD, PAD History of Present Illness  Patient underwent PCI to left circumflex for NSTEMI in 05/2024.  Unfortunately, he had another NSTEMI event with complete occlusion of mid to distal left circumflex.  With completion of infarct and no ongoing chest pain, it was deemed that he may not benefit from additional revascularization.  Separately, he also underwent SFA stenting by Dr. Magda.  He has not had any recent chest pain.  Episode of pain in his right groin yesterday that he attributes to weather, which is now completely resolved.  Unfortunately, he continues to smoke and is not willing to quit at this time.     Vitals:   10/11/24 0927  BP: (!) 146/96  Pulse: 78  SpO2: 99%      Review of Systems  Cardiovascular:  Negative for chest pain, dyspnea on exertion, leg swelling, palpitations and syncope.        Studies Reviewed: SABRA        Coronary angiogram 08/2024: Single vessel occlusive CAD. The distal LCx is now occluded. No significant collaterals. Patent stents in RCA Inferobasal wall motion abnormality with overall mild LV dysfunction Normal LVEDP Coronary angiography and intervention 04/12/2024: LM: No significant disease LAD: Mild diffuse disease Lcx: Mid 60% stenosis (nonculprit), followed by tandem 80 and 99% stenosis in mid to distal vessel (culprit) RCA: Patent proximal and mid stents with no significant restenosis.  Distal RCA focal 40% stenosis.   LVEDP 21 mmHg   Successful percutaneous coronary intervention mid to distal LCx        PTCA and stent placement 2.25 X 28 mm Synergy drug-eluting stent        Post  dilatation using 2.25 and 2.5 mm [place] balloons up tp 18 atm     Echocardiogram 05/2024:  1. Left ventricular ejection fraction, by estimation, is 55 to 60%. Left  ventricular ejection fraction by 3D volume is 52 %. The left ventricle has  normal function. The left ventricle has no regional wall motion  abnormalities. There is mild concentric  left ventricular hypertrophy. Left ventricular diastolic parameters are  consistent with Grade I diastolic dysfunction (impaired relaxation). The  average left ventricular global longitudinal strain is -16.2 %. The global  longitudinal strain is normal.   2. Right ventricular systolic function is normal. The right ventricular  size is normal.   3. The mitral valve is normal in structure. No evidence of mitral valve  regurgitation. No evidence of mitral stenosis.   4. The aortic valve is tricuspid. There is mild calcification of the  aortic valve. Aortic valve regurgitation is trivial. Aortic valve  sclerosis/calcification is present, without any evidence of aortic  stenosis.   5. Aortic dilatation noted. There is mild dilatation of the aortic root,  measuring 42 mm. There is borderline dilatation of the ascending aorta,  measuring 39 mm.   6. The inferior vena cava is normal in size with greater than 50%  respiratory variability, suggesting right atrial pressure of 3 mmHg.   Labs 08/2024: Chol 93, TG 80, HDL 32, LDL 45 HbA1C 5.4% Hb 14.7 Cr 1,1    Physical Exam Vitals  and nursing note reviewed.  Constitutional:      General: He is not in acute distress. Neck:     Vascular: No JVD.  Cardiovascular:     Rate and Rhythm: Normal rate and regular rhythm.     Heart sounds: Normal heart sounds. No murmur heard. Pulmonary:     Effort: Pulmonary effort is normal.     Breath sounds: Normal breath sounds. No wheezing or rales.  Musculoskeletal:     Right lower leg: No edema.     Left lower leg: No edema.      VISIT DIAGNOSES:   ICD-10-CM    1. Coronary artery disease involving native coronary artery of native heart without angina pectoris  I25.10 ECHOCARDIOGRAM COMPLETE    2. PAD (peripheral artery disease)  I73.9        Gregory Grant is a 58 y.o. male with HIV on treatment, nicotine dependence, CAD, PAD Assessment & Plan  CAD: No anginal symptoms at this time.  Continue medical therapy with aspirin  and Effient  till 08/2025. After that, can switch to Plavix monotherapy. Lipids well-controlled. Not willing to quit smoking at this time. Told that he did not have an echocardiogram after his NSTEMI in 08/2024 from left circumflex occlusion, will obtain echocardiogram now.  PAD: S/p left SFA stenting. Continue medical therapy. Offered to quit smoking at this time.     F/u in 6 months  Signed, Newman JINNY Lawrence, MD

## 2024-10-11 NOTE — Patient Instructions (Signed)
   Testing/Procedures: Echocardiogram  Your physician has requested that you have an echocardiogram. Echocardiography is a painless test that uses sound waves to create images of your heart. It provides your doctor with information about the size and shape of your heart and how well your heart's chambers and valves are working. This procedure takes approximately one hour. There are no restrictions for this procedure. Please do NOT wear cologne, perfume, aftershave, or lotions (deodorant is allowed). Please arrive 15 minutes prior to your appointment time.  Please note: We ask at that you not bring children with you during ultrasound (echo/ vascular) testing. Due to room size and safety concerns, children are not allowed in the ultrasound rooms during exams. Our front office staff cannot provide observation of children in our lobby area while testing is being conducted. An adult accompanying a patient to their appointment will only be allowed in the ultrasound room at the discretion of the ultrasound technician under special circumstances. We apologize for any inconvenience.   Follow-Up: At [facility], you and your health needs are our priority.  As part of our continuing mission to provide you with exceptional heart care, our providers are all part of one team.  This team includes your primary Cardiologist (physician) and Advanced Practice Providers or APPs (Physician Assistants and Nurse Practitioners) who all work together to provide you with the care you need, when you need it.  Your next appointment:   6 month(s)  Provider:   Newman JINNY Lawrence, MD

## 2024-10-11 NOTE — Telephone Encounter (Signed)
 Chart review. Patient appropriate for scheduling at cardiac rehab.Hadassah Elpidio Quan RN BSN

## 2024-10-14 ENCOUNTER — Telehealth (HOSPITAL_COMMUNITY): Payer: Self-pay

## 2024-10-14 NOTE — Telephone Encounter (Signed)
 Called patient to see if he was interested in participating in the Cardiac Rehab Program. Patient will come in for orientation on 11/25 and will attend the 12:30 exercise class.  Pensions consultant.

## 2024-11-07 ENCOUNTER — Telehealth (HOSPITAL_COMMUNITY): Payer: Self-pay

## 2024-11-07 NOTE — Telephone Encounter (Signed)
 Confirmed cardiac orientation appointment time of 11/08/24 at 1030.  Pt declined to complete cardiac health history over the phone.

## 2024-11-08 ENCOUNTER — Encounter (HOSPITAL_COMMUNITY)
Admission: RE | Admit: 2024-11-08 | Discharge: 2024-11-08 | Disposition: A | Discharge status: Home / Self Care | Source: Ambulatory Visit | Attending: Cardiology | Admitting: Cardiology

## 2024-11-08 ENCOUNTER — Telehealth (HOSPITAL_COMMUNITY): Payer: Self-pay | Admitting: *Deleted

## 2024-11-08 VITALS — BP 124/88 | HR 73 | Ht 68.0 in | Wt 182.1 lb

## 2024-11-08 DIAGNOSIS — I214 Non-ST elevation (NSTEMI) myocardial infarction: Secondary | ICD-10-CM | POA: Diagnosis present

## 2024-11-08 NOTE — Progress Notes (Signed)
 Cardiac Rehab Medication Review by a Nurse   Does the patient  feel that his/her medications are working for him/her?  yes   Has the patient been experiencing any side effects to the medications prescribed?  no   Does the patient measure his/her own blood pressure or blood glucose at home?  yes    Does the patient have any problems obtaining medications due to transportation or finances?   no   Understanding of regimen: excellent Understanding of indications: excellent Potential of compliance: excellent   Nurse comments: Pt states I have no questions regarding my prescribed medications at this time.

## 2024-11-08 NOTE — Progress Notes (Signed)
 Cardiac Individual Treatment Plan  Patient Details  Name: Gregory Grant MRN: [id number] Date of Birth: 1966-10-19 Referring Provider:   Flowsheet Row CARDIAC REHAB PHASE II ORIENTATION from 11/08/2024 in [facility]  Referring Provider Newman Lawrence, MD    Initial Encounter Date:  Flowsheet Row CARDIAC REHAB PHASE II ORIENTATION from 11/08/2024 in [facility]  Date 11/08/24    Visit Diagnosis: 09/05/24 NSTEMI (non-ST elevated myocardial infarction) [facility])  Patient's Home Medications on Admission:  Current Outpatient Medications:    aspirin  EC 81 MG tablet, Take 1 tablet (81 mg total) by mouth daily. Swallow whole., Disp: 30 tablet, Rfl: 12   bictegravir-emtricitabine -tenofovir  AF (BIKTARVY ) 50-200-25 MG TABS tablet, Take 1 tablet by mouth daily., Disp: 30 tablet, Rfl: 6   ezetimibe  (ZETIA ) 10 MG tablet, Take 1 tablet (10 mg total) by mouth daily., Disp: 90 tablet, Rfl: 3   nitroGLYCERIN  (NITROSTAT ) 0.4 MG SL tablet, Place 1 tablet (0.4 mg total) under the tongue every 5 (five) minutes as needed for chest pain., Disp: 25 tablet, Rfl: 4   prasugrel  (EFFIENT ) 10 MG TABS tablet, Take 1 tablet (10 mg total) by mouth daily., Disp: 90 tablet, Rfl: 3   rosuvastatin  (CRESTOR ) 40 MG tablet, Take 1 tablet (40 mg total) by mouth daily., Disp: 90 tablet, Rfl: 3   carvedilol  (COREG ) 6.25 MG tablet, Take 1 tablet (6.25 mg total) by mouth 2 (two) times daily. (Patient taking differently: Take 6.25 mg by mouth daily.), Disp: 180 tablet, Rfl: 3  Past Medical History: Past Medical History:  Diagnosis Date   Coughing 02/2015   GERD (gastroesophageal reflux disease)    HIV (human immunodeficiency virus infection) (HCC)    Peripheral arterial disease     Tobacco Use: Social History   Tobacco Use  Smoking Status Every Day   Current packs/day: 0.50   Average packs/day: 0.5  packs/day for 30.0 years (15.0 ttl pk-yrs)   Types: Cigarettes  Smokeless Tobacco Never    Labs: Review Flowsheet  More data exists      Latest Ref Rng & Units 04/12/2024 05/13/2024 08/09/2024 09/05/2024 09/06/2024  Labs for ITP Cardiac and Pulmonary Rehab  Cholestrol 0 - 200 mg/dL 826  - 867  - 93   LDL (calc) 0 - 99 mg/dL 881  - 67  - 45   HDL-C >40 mg/dL 40  - 48  - 32   Trlycerides <150 mg/dL 76  - 90  - 80   Hemoglobin A1c 4.8 - 5.6 % 5.4  - - - -  TCO2 22 - 32 mmol/L 21  23  - 18  -    Capillary Blood Glucose: Lab Results  Component Value Date   GLUCAP 86 03/30/2018     Exercise Target Goals: Exercise Program Goal: Individual exercise prescription set using results from initial 6 min walk test and THRR while considering  patient's activity barriers and safety.   Exercise Prescription Goal: Initial exercise prescription builds to 30-45 minutes a day of aerobic activity, 2-3 days per week.  Home exercise guidelines will be given to patient during program as part of exercise prescription that the participant will acknowledge.  Activity Barriers & Risk Stratification:  Activity Barriers & Cardiac Risk Stratification - 11/08/24 1223       Activity Barriers & Cardiac Risk Stratification   Activity Barriers Back Problems;Arthritis;Neck/Spine Problems;Deconditioning;Other (comment)    Comments PAD, right leg needs stenting  due to blockage    Cardiac Risk Stratification High          6 Minute Walk:  6 Minute Walk     Row Name 11/08/24 1145         6 Minute Walk   Phase Initial     Distance 1187 feet     Walk Time 6 minutes     # of Rest Breaks 0     MPH 2.25     METS 3.5     RPE 9     Perceived Dyspnea  0     VO2 Peak 12.2     Symptoms Yes (comment)     Comments Rt calf pain 10/10, resolved with rest     Resting HR 73 bpm     Resting BP 124/88     Resting Oxygen Saturation  98 %     Exercise Oxygen Saturation  during 6 min walk 97 %     Max Ex. HR 94 bpm      Max Ex. BP 140/80     2 Minute Post BP 128/80        Oxygen Initial Assessment:   Oxygen Re-Evaluation:   Oxygen Discharge (Final Oxygen Re-Evaluation):   Initial Exercise Prescription:  Initial Exercise Prescription - 11/08/24 1200       Date of Initial Exercise RX and Referring Provider   Date 11/08/24    Referring Provider Newman Lawrence, MD    Expected Discharge Date 02/02/24      Bike   Level 1.5    Watts 39    Minutes 15    METs 3.5      Arm Ergometer   Level 1.5    Watts 25    RPM 60    Minutes 15    METs 3.5      Prescription Details   Frequency (times per week) 3    Duration Progress to 30 minutes of continuous aerobic without signs/symptoms of physical distress      Intensity   THRR 40-80% of Max Heartrate 66-132    Ratings of Perceived Exertion 11-13    Perceived Dyspnea 0-4      Progression   Progression Continue progressive overload as per policy without signs/symptoms or physical distress.      Resistance Training   Training Prescription Yes    Weight 4 lbs    Reps 10-15          Perform Capillary Blood Glucose checks as needed.  Exercise Prescription Changes:   Exercise Comments:   Exercise Goals and Review:   Exercise Goals     Row Name 11/08/24 1118             Exercise Goals   Increase Physical Activity Yes       Intervention Provide advice, education, support and counseling about physical activity/exercise needs.;Develop an individualized exercise prescription for aerobic and resistive training based on initial evaluation findings, risk stratification, comorbidities and participant's personal goals.       Expected Outcomes Short Term: Attend rehab on a regular basis to increase amount of physical activity.;Long Term: Add in home exercise to make exercise part of routine and to increase amount of physical activity.;Long Term: Exercising regularly at least 3-5 days a week.       Increase Strength and Stamina Yes        Intervention Provide advice, education, support and counseling about physical activity/exercise needs.;Develop an individualized exercise prescription for aerobic and resistive  training based on initial evaluation findings, risk stratification, comorbidities and participant's personal goals.       Expected Outcomes Short Term: Increase workloads from initial exercise prescription for resistance, speed, and METs.;Short Term: Perform resistance training exercises routinely during rehab and add in resistance training at home;Long Term: Improve cardiorespiratory fitness, muscular endurance and strength as measured by increased METs and functional capacity ( )       Able to understand and use rate of perceived exertion (RPE) scale Yes       Intervention Provide education and explanation on how to use RPE scale       Expected Outcomes Short Term: Able to use RPE daily in rehab to express subjective intensity level;Long Term:  Able to use RPE to guide intensity level when exercising independently       Knowledge and understanding of Target Heart Rate Range (THRR) Yes       Intervention Provide education and explanation of THRR including how the numbers were predicted and where they are located for reference       Expected Outcomes Short Term: Able to state/look up THRR;Short Term: Able to use daily as guideline for intensity in rehab;Long Term: Able to use THRR to govern intensity when exercising independently       Understanding of Exercise Prescription Yes       Intervention Provide education, explanation, and written materials on patient's individual exercise prescription       Expected Outcomes Short Term: Able to explain program exercise prescription;Long Term: Able to explain home exercise prescription to exercise independently          Exercise Goals Re-Evaluation :   Discharge Exercise Prescription (Final Exercise Prescription Changes):   Nutrition:  Target Goals: Understanding of  nutrition guidelines, daily intake of sodium 1500mg , cholesterol 200mg , calories 30% from fat and 7% or less from saturated fats, daily to have 5 or more servings of fruits and vegetables.  Biometrics:  Pre Biometrics - 11/08/24 1100       Pre Biometrics   Waist Circumference 39 inches    Hip Circumference 40 inches    Waist to Hip Ratio 0.98 %    Triceps Skinfold 12 mm    % Body Fat 25.8 %    Grip Strength 26 kg    Flexibility 15.5 in    Single Leg Stand 17.8 seconds           Nutrition Therapy Plan and Nutrition Goals:   Nutrition Assessments:  MEDIFICTS Score Key: >=70 Need to make dietary changes  40-70 Heart Healthy Diet <= 40 Therapeutic Level Cholesterol Diet    Picture Your Plate Scores: <59 Unhealthy dietary pattern with much room for improvement. 41-50 Dietary pattern unlikely to meet recommendations for good health and room for improvement. 51-60 More healthful dietary pattern, with some room for improvement.  >60 Healthy dietary pattern, although there may be some specific behaviors that could be improved.    Nutrition Goals Re-Evaluation:   Nutrition Goals Re-Evaluation:   Nutrition Goals Discharge (Final Nutrition Goals Re-Evaluation):   Psychosocial: Target Goals: Acknowledge presence or absence of significant depression and/or stress, maximize coping skills, provide positive support system. Participant is able to verbalize types and ability to use techniques and skills needed for reducing stress and depression.  Initial Review & Psychosocial Screening:  Initial Psych Review & Screening - 11/08/24 1026       Initial Review   Current issues with None Identified      Family Dynamics  Good Support System? Yes   Pt has his fiance, step-brother and daughter as a part of his support system     Screening Interventions   Interventions Encouraged to exercise;Provide feedback about the scores to participant;To provide support and resources with  identified psychosocial needs    Expected Outcomes Long Term Goal: Stressors or current issues are controlled or eliminated.;Short Term goal: Identification and review with participant of any Quality of Life or Depression concerns found by scoring the questionnaire.;Long Term goal: The participant improves quality of Life and PHQ9 Scores as seen by post scores and/or verbalization of changes          Quality of Life Scores:  Quality of Life - 11/08/24 1118       Quality of Life   Select Quality of Life      Quality of Life Scores   Health/Function Pre 27.57 %    Socioeconomic Pre 30 %    Psych/Spiritual Pre 30 %    Family Pre 30 %    GLOBAL Pre 28.96 %         Scores of 19 and below usually indicate a poorer quality of life in these areas.  A difference of  2-3 points is a clinically meaningful difference.  A difference of 2-3 points in the total score of the Quality of Life Index has been associated with significant improvement in overall quality of life, self-image, physical symptoms, and general health in studies assessing change in quality of life.  PHQ-9: Review Flowsheet  More data exists      11/08/2024 08/23/2024 02/19/2024 09/04/2023 03/12/2023  Depression screen PHQ 2/9  Decreased Interest 0 3 0 0 0  Down, Depressed, Hopeless 0 0 0 0 0  PHQ - 2 Score 0 3 0 0 0  Altered sleeping 3 2 - - -  Tired, decreased energy 0 3 - - -  Change in appetite 0 0 - - -  Feeling bad or failure about yourself  0 1 - - -  Trouble concentrating 0 0 - - -  Moving slowly or fidgety/restless 1 0 - - -  Suicidal thoughts 0 1 - - -  PHQ-9 Score 4 10  - - -  Difficult doing work/chores Not difficult at all Not difficult at all - - -    Details       Data saved with a previous flowsheet row definition        Interpretation of Total Score  Total Score Depression Severity:  1-4 = Minimal depression, 5-9 = Mild depression, 10-14 = Moderate depression, 15-19 = Moderately severe depression,  20-27 = Severe depression   Psychosocial Evaluation and Intervention:   Psychosocial Re-Evaluation:   Psychosocial Discharge (Final Psychosocial Re-Evaluation):   Vocational Rehabilitation: Provide vocational rehab assistance to qualifying candidates.   Vocational Rehab Evaluation & Intervention:  Vocational Rehab - 11/08/24 1229       Initial Vocational Rehab Evaluation & Intervention   Assessment shows need for Vocational Rehabilitation --   Pt has applied for disability         Education: Education Goals: Education classes will be provided on a weekly basis, covering required topics. Participant will state understanding/return demonstration of topics presented.     Core Videos: Exercise    Move It!  Clinical staff conducted group or individual video education with verbal and written material and guidebook.  Patient learns the recommended Pritikin exercise program. Exercise with the goal of living a long, healthy life. Some  of the health benefits of exercise include controlled diabetes, healthier blood pressure levels, improved cholesterol levels, improved heart and lung capacity, improved sleep, and better body composition. Everyone should speak with their doctor before starting or changing an exercise routine.  Biomechanical Limitations Clinical staff conducted group or individual video education with verbal and written material and guidebook.  Patient learns how biomechanical limitations can impact exercise and how we can mitigate and possibly overcome limitations to have an impactful and balanced exercise routine.  Body Composition Clinical staff conducted group or individual video education with verbal and written material and guidebook.  Patient learns that body composition (ratio of muscle mass to fat mass) is a key component to assessing overall fitness, rather than body weight alone. Increased fat mass, especially visceral belly fat, can put us  at increased risk  for metabolic syndrome, type 2 diabetes, heart disease, and even death. It is recommended to combine diet and exercise (cardiovascular and resistance training) to improve your body composition. Seek guidance from your physician and exercise physiologist before implementing an exercise routine.  Exercise Action Plan Clinical staff conducted group or individual video education with verbal and written material and guidebook.  Patient learns the recommended strategies to achieve and enjoy long-term exercise adherence, including variety, self-motivation, self-efficacy, and positive decision making. Benefits of exercise include fitness, good health, weight management, more energy, better sleep, less stress, and overall well-being.  Medical   Heart Disease Risk Reduction Clinical staff conducted group or individual video education with verbal and written material and guidebook.  Patient learns our heart is our most vital organ as it circulates oxygen, nutrients, white blood cells, and hormones throughout the entire body, and carries waste away. Data supports a plant-based eating plan like the Pritikin Program for its effectiveness in slowing progression of and reversing heart disease. The video provides a number of recommendations to address heart disease.   Metabolic Syndrome and Belly Fat  Clinical staff conducted group or individual video education with verbal and written material and guidebook.  Patient learns what metabolic syndrome is, how it leads to heart disease, and how one can reverse it and keep it from coming back. You have metabolic syndrome if you have 3 of the following 5 criteria: abdominal obesity, high blood pressure, high triglycerides, low HDL cholesterol, and high blood sugar.  Hypertension and Heart Disease Clinical staff conducted group or individual video education with verbal and written material and guidebook.  Patient learns that high blood pressure, or hypertension, is very  common in the [place] . Hypertension is largely due to excessive salt intake, but other important risk factors include being overweight, physical inactivity, drinking too much alcohol, smoking, and not eating enough potassium from fruits and vegetables. High blood pressure is a leading risk factor for heart attack, stroke, congestive heart failure, dementia, kidney failure, and premature death. Long-term effects of excessive salt intake include stiffening of the arteries and thickening of heart muscle and organ damage. Recommendations include ways to reduce hypertension and the risk of heart disease.  Diseases of Our Time - Focusing on Diabetes Clinical staff conducted group or individual video education with verbal and written material and guidebook.  Patient learns why the best way to stop diseases of our time is prevention, through food and other lifestyle changes. Medicine (such as prescription pills and surgeries) is often only a Band-Aid on the problem, not a long-term solution. Most common diseases of our time include obesity, type 2 diabetes, hypertension, heart disease, and cancer.  The Pritikin Program is recommended and has been proven to help reduce, reverse, and/or prevent the damaging effects of metabolic syndrome.  Nutrition   Overview of the Pritikin Eating Plan  Clinical staff conducted group or individual video education with verbal and written material and guidebook.  Patient learns about the Pritikin Eating Plan for disease risk reduction. The Pritikin Eating Plan emphasizes a wide variety of unrefined, minimally-processed carbohydrates, like fruits, vegetables, whole grains, and legumes. Go, Caution, and Stop food choices are explained. Plant-based and lean animal proteins are emphasized. Rationale provided for low sodium intake for blood pressure control, low added sugars for blood sugar stabilization, and low added fats and oils for coronary artery disease risk reduction and  weight management.  Calorie Density  Clinical staff conducted group or individual video education with verbal and written material and guidebook.  Patient learns about calorie density and how it impacts the Pritikin Eating Plan. Knowing the characteristics of the food you choose will help you decide whether those foods will lead to weight gain or weight loss, and whether you want to consume more or less of them. Weight loss is usually a side effect of the Pritikin Eating Plan because of its focus on low calorie-dense foods.  Label Reading  Clinical staff conducted group or individual video education with verbal and written material and guidebook.  Patient learns about the Pritikin recommended label reading guidelines and corresponding recommendations regarding calorie density, added sugars, sodium content, and whole grains.  Dining Out - Part 1  Clinical staff conducted group or individual video education with verbal and written material and guidebook.  Patient learns that restaurant meals can be sabotaging because they can be so high in calories, fat, sodium, and/or sugar. Patient learns recommended strategies on how to positively address this and avoid unhealthy pitfalls.  Facts on Fats  Clinical staff conducted group or individual video education with verbal and written material and guidebook.  Patient learns that lifestyle modifications can be just as effective, if not more so, as many medications for lowering your risk of heart disease. A Pritikin lifestyle can help to reduce your risk of inflammation and atherosclerosis (cholesterol build-up, or plaque, in the artery walls). Lifestyle interventions such as dietary choices and physical activity address the cause of atherosclerosis. A review of the types of fats and their impact on blood cholesterol levels, along with dietary recommendations to reduce fat intake is also included.  Nutrition Action Plan  Clinical staff conducted group or  individual video education with verbal and written material and guidebook.  Patient learns how to incorporate Pritikin recommendations into their lifestyle. Recommendations include planning and keeping personal health goals in mind as an important part of their success.  Healthy Mind-Set    Healthy Minds, Bodies, Hearts  Clinical staff conducted group or individual video education with verbal and written material and guidebook.  Patient learns how to identify when they are stressed. Video will discuss the impact of that stress, as well as the many benefits of stress management. Patient will also be introduced to stress management techniques. The way we think, act, and feel has an impact on our hearts.  How Our Thoughts Can Heal Our Hearts  Clinical staff conducted group or individual video education with verbal and written material and guidebook.  Patient learns that negative thoughts can cause depression and anxiety. This can result in negative lifestyle behavior and serious health problems. Cognitive behavioral therapy is an effective method to help control our thoughts in  order to change and improve our emotional outlook.  Additional Videos:  Exercise    Improving Performance  Clinical staff conducted group or individual video education with verbal and written material and guidebook.  Patient learns to use a non-linear approach by alternating intensity levels and lengths of time spent exercising to help burn more calories and lose more body fat. Cardiovascular exercise helps improve heart health, metabolism, hormonal balance, blood sugar control, and recovery from fatigue. Resistance training improves strength, endurance, balance, coordination, reaction time, metabolism, and muscle mass. Flexibility exercise improves circulation, posture, and balance. Seek guidance from your physician and exercise physiologist before implementing an exercise routine and learn your capabilities and proper form for  all exercise.  Introduction to Yoga  Clinical staff conducted group or individual video education with verbal and written material and guidebook.  Patient learns about yoga, a discipline of the coming together of mind, breath, and body. The benefits of yoga include improved flexibility, improved range of motion, better posture and core strength, increased lung function, weight loss, and positive self-image. Yoga's heart health benefits include lowered blood pressure, healthier heart rate, decreased cholesterol and triglyceride levels, improved immune function, and reduced stress. Seek guidance from your physician and exercise physiologist before implementing an exercise routine and learn your capabilities and proper form for all exercise.  Medical   Aging: Enhancing Your Quality of Life  Clinical staff conducted group or individual video education with verbal and written material and guidebook.  Patient learns key strategies and recommendations to stay in good physical health and enhance quality of life, such as prevention strategies, having an advocate, securing a Health Care Proxy and Power of Attorney, and keeping a list of medications and system for tracking them. It also discusses how to avoid risk for bone loss.  Biology of Weight Control  Clinical staff conducted group or individual video education with verbal and written material and guidebook.  Patient learns that weight gain occurs because we consume more calories than we burn (eating more, moving less). Even if your body weight is normal, you may have higher ratios of fat compared to muscle mass. Too much body fat puts you at increased risk for cardiovascular disease, heart attack, stroke, type 2 diabetes, and obesity-related cancers. In addition to exercise, following the Pritikin Eating Plan can help reduce your risk.  Decoding Lab Results  Clinical staff conducted group or individual video education with verbal and written material and  guidebook.  Patient learns that lab test reflects one measurement whose values change over time and are influenced by many factors, including medication, stress, sleep, exercise, food, hydration, pre-existing medical conditions, and more. It is recommended to use the knowledge from this video to become more involved with your lab results and evaluate your numbers to speak with your doctor.   Diseases of Our Time - Overview  Clinical staff conducted group or individual video education with verbal and written material and guidebook.  Patient learns that according to the CDC, 50% to 70% of chronic diseases (such as obesity, type 2 diabetes, elevated lipids, hypertension, and heart disease) are avoidable through lifestyle improvements including healthier food choices, listening to satiety cues, and increased physical activity.  Sleep Disorders Clinical staff conducted group or individual video education with verbal and written material and guidebook.  Patient learns how good quality and duration of sleep are important to overall health and well-being. Patient also learns about sleep disorders and how they impact health along with recommendations to address them,  including discussing with a physician.  Nutrition  Dining Out - Part 2 Clinical staff conducted group or individual video education with verbal and written material and guidebook.  Patient learns how to plan ahead and communicate in order to maximize their dining experience in a healthy and nutritious manner. Included are recommended food choices based on the type of restaurant the patient is visiting.   Fueling a Banker conducted group or individual video education with verbal and written material and guidebook.  There is a strong connection between our food choices and our health. Diseases like obesity and type 2 diabetes are very prevalent and are in large-part due to lifestyle choices. The Pritikin Eating Plan  provides plenty of food and hunger-curbing satisfaction. It is easy to follow, affordable, and helps reduce health risks.  Menu Workshop  Clinical staff conducted group or individual video education with verbal and written material and guidebook.  Patient learns that restaurant meals can sabotage health goals because they are often packed with calories, fat, sodium, and sugar. Recommendations include strategies to plan ahead and to communicate with the manager, chef, or server to help order a healthier meal.  Planning Your Eating Strategy  Clinical staff conducted group or individual video education with verbal and written material and guidebook.  Patient learns about the Pritikin Eating Plan and its benefit of reducing the risk of disease. The Pritikin Eating Plan does not focus on calories. Instead, it emphasizes high-quality, nutrient-rich foods. By knowing the characteristics of the foods, we choose, we can determine their calorie density and make informed decisions.  Targeting Your Nutrition Priorities  Clinical staff conducted group or individual video education with verbal and written material and guidebook.  Patient learns that lifestyle habits have a tremendous impact on disease risk and progression. This video provides eating and physical activity recommendations based on your personal health goals, such as reducing LDL cholesterol, losing weight, preventing or controlling type 2 diabetes, and reducing high blood pressure.  Vitamins and Minerals  Clinical staff conducted group or individual video education with verbal and written material and guidebook.  Patient learns different ways to obtain key vitamins and minerals, including through a recommended healthy diet. It is important to discuss all supplements you take with your doctor.   Healthy Mind-Set    Smoking Cessation  Clinical staff conducted group or individual video education with verbal and written material and guidebook.   Patient learns that cigarette smoking and tobacco addiction pose a serious health risk which affects millions of people. Stopping smoking will significantly reduce the risk of heart disease, lung disease, and many forms of cancer. Recommended strategies for quitting are covered, including working with your doctor to develop a successful plan.  Culinary   Becoming a Set Designer conducted group or individual video education with verbal and written material and guidebook.  Patient learns that cooking at home can be healthy, cost-effective, quick, and puts them in control. Keys to cooking healthy recipes will include looking at your recipe, assessing your equipment needs, planning ahead, making it simple, choosing cost-effective seasonal ingredients, and limiting the use of added fats, salts, and sugars.  Cooking - Breakfast and Snacks  Clinical staff conducted group or individual video education with verbal and written material and guidebook.  Patient learns how important breakfast is to satiety and nutrition through the entire day. Recommendations include key foods to eat during breakfast to help stabilize blood sugar levels and to prevent overeating at  meals later in the day. Planning ahead is also a key component.  Cooking - Educational Psychologist conducted group or individual video education with verbal and written material and guidebook.  Patient learns eating strategies to improve overall health, including an approach to cook more at home. Recommendations include thinking of animal protein as a side on your plate rather than center stage and focusing instead on lower calorie dense options like vegetables, fruits, whole grains, and plant-based proteins, such as beans. Making sauces in large quantities to freeze for later and leaving the skin on your vegetables are also recommended to maximize your experience.  Cooking - Healthy Salads and Dressing Clinical staff  conducted group or individual video education with verbal and written material and guidebook.  Patient learns that vegetables, fruits, whole grains, and legumes are the foundations of the Pritikin Eating Plan. Recommendations include how to incorporate each of these in flavorful and healthy salads, and how to create homemade salad dressings. Proper handling of ingredients is also covered. Cooking - Soups and [employer] - Soups and Desserts Clinical staff conducted group or individual video education with verbal and written material and guidebook.  Patient learns that Pritikin soups and desserts make for easy, nutritious, and delicious snacks and meal components that are low in sodium, fat, sugar, and calorie density, while high in vitamins, minerals, and filling fiber. Recommendations include simple and healthy ideas for soups and desserts.   Overview     The Pritikin Solution Program Overview Clinical staff conducted group or individual video education with verbal and written material and guidebook.  Patient learns that the results of the Pritikin Program have been documented in more than 100 articles published in peer-reviewed journals, and the benefits include reducing risk factors for (and, in some cases, even reversing) high cholesterol, high blood pressure, type 2 diabetes, obesity, and more! An overview of the three key pillars of the Pritikin Program will be covered: eating well, doing regular exercise, and having a healthy mind-set.  WORKSHOPS  Exercise: Exercise Basics: Building Your Action Plan Clinical staff led group instruction and group discussion with PowerPoint presentation and patient guidebook. To enhance the learning environment the use of posters, models and videos may be added. At the conclusion of this workshop, patients will comprehend the difference between physical activity and exercise, as well as the benefits of incorporating both, into their routine. Patients will  understand the FITT (Frequency, Intensity, Time, and Type) principle and how to use it to build an exercise action plan. In addition, safety concerns and other considerations for exercise and cardiac rehab will be addressed by the presenter. The purpose of this lesson is to promote a comprehensive and effective weekly exercise routine in order to improve patients' overall level of fitness.   Managing Heart Disease: Your Path to a Healthier Heart Clinical staff led group instruction and group discussion with PowerPoint presentation and patient guidebook. To enhance the learning environment the use of posters, models and videos may be added.At the conclusion of this workshop, patients will understand the anatomy and physiology of the heart. Additionally, they will understand how Pritikin's three pillars impact the risk factors, the progression, and the management of heart disease.  The purpose of this lesson is to provide a high-level overview of the heart, heart disease, and how the Pritikin lifestyle positively impacts risk factors.  Exercise Biomechanics Clinical staff led group instruction and group discussion with PowerPoint presentation and patient guidebook. To enhance the learning  environment the use of posters, models and videos may be added. Patients will learn how the structural parts of their bodies function and how these functions impact their daily activities, movement, and exercise. Patients will learn how to promote a neutral spine, learn how to manage pain, and identify ways to improve their physical movement in order to promote healthy living. The purpose of this lesson is to expose patients to common physical limitations that impact physical activity. Participants will learn practical ways to adapt and manage aches and pains, and to minimize their effect on regular exercise. Patients will learn how to maintain good posture while sitting, walking, and lifting.  Balance Training  and Fall Prevention  Clinical staff led group instruction and group discussion with PowerPoint presentation and patient guidebook. To enhance the learning environment the use of posters, models and videos may be added. At the conclusion of this workshop, patients will understand the importance of their sensorimotor skills (vision, proprioception, and the vestibular system) in maintaining their ability to balance as they age. Patients will apply a variety of balancing exercises that are appropriate for their current level of function. Patients will understand the common causes for poor balance, possible solutions to these problems, and ways to modify their physical environment in order to minimize their fall risk. The purpose of this lesson is to teach patients about the importance of maintaining balance as they age and ways to minimize their risk of falling.  WORKSHOPS   Nutrition:  Fueling a Ship Broker led group instruction and group discussion with PowerPoint presentation and patient guidebook. To enhance the learning environment the use of posters, models and videos may be added. Patients will review the foundational principles of the Pritikin Eating Plan and understand what constitutes a serving size in each of the food groups. Patients will also learn Pritikin-friendly foods that are better choices when away from home and review make-ahead meal and snack options. Calorie density will be reviewed and applied to three nutrition priorities: weight maintenance, weight loss, and weight gain. The purpose of this lesson is to reinforce (in a group setting) the key concepts around what patients are recommended to eat and how to apply these guidelines when away from home by planning and selecting Pritikin-friendly options. Patients will understand how calorie density may be adjusted for different weight management goals.  Mindful Eating  Clinical staff led group instruction and group  discussion with PowerPoint presentation and patient guidebook. To enhance the learning environment the use of posters, models and videos may be added. Patients will briefly review the concepts of the Pritikin Eating Plan and the importance of low-calorie dense foods. The concept of mindful eating will be introduced as well as the importance of paying attention to internal hunger signals. Triggers for non-hunger eating and techniques for dealing with triggers will be explored. The purpose of this lesson is to provide patients with the opportunity to review the basic principles of the Pritikin Eating Plan, discuss the value of eating mindfully and how to measure internal cues of hunger and fullness using the Hunger Scale. Patients will also discuss reasons for non-hunger eating and learn strategies to use for controlling emotional eating.  Targeting Your Nutrition Priorities Clinical staff led group instruction and group discussion with PowerPoint presentation and patient guidebook. To enhance the learning environment the use of posters, models and videos may be added. Patients will learn how to determine their genetic susceptibility to disease by reviewing their family history. Patients will gain  insight into the importance of diet as part of an overall healthy lifestyle in mitigating the impact of genetics and other environmental insults. The purpose of this lesson is to provide patients with the opportunity to assess their personal nutrition priorities by looking at their family history, their own health history and current risk factors. Patients will also be able to discuss ways of prioritizing and modifying the Pritikin Eating Plan for their highest risk areas  Menu  Clinical staff led group instruction and group discussion with PowerPoint presentation and patient guidebook. To enhance the learning environment the use of posters, models and videos may be added. Using menus brought in from [employer],  or printed from [employer], patients will apply the Pritikin dining out guidelines that were presented in the [employer] video. Patients will also be able to practice these guidelines in a variety of provided scenarios. The purpose of this lesson is to provide patients with the opportunity to practice hands-on learning of the Pritikin Dining Out guidelines with actual menus and practice scenarios.  Label Reading Clinical staff led group instruction and group discussion with PowerPoint presentation and patient guidebook. To enhance the learning environment the use of posters, models and videos may be added. Patients will review and discuss the Pritikin label reading guidelines presented in Pritikin's Label Reading Educational series video. Using fool labels brought in from local grocery stores and markets, patients will apply the label reading guidelines and determine if the packaged food meet the Pritikin guidelines. The purpose of this lesson is to provide patients with the opportunity to review, discuss, and practice hands-on learning of the Pritikin Label Reading guidelines with actual packaged food labels. Cooking School  Pritikin's [employer] are designed to teach patients ways to prepare quick, simple, and affordable recipes at home. The importance of nutrition's role in chronic disease risk reduction is reflected in its emphasis in the overall Pritikin program. By learning how to prepare essential core Pritikin Eating Plan recipes, patients will increase control over what they eat; be able to customize the flavor of foods without the use of added salt, sugar, or fat; and improve the quality of the food they consume. By learning a set of core recipes which are easily assembled, quickly prepared, and affordable, patients are more likely to prepare more healthy foods at home. These workshops focus on convenient breakfasts, simple entres, side dishes, and desserts which  can be prepared with minimal effort and are consistent with nutrition recommendations for cardiovascular risk reduction. Cooking [employer] are taught by a armed forces logistics/support/administrative officer (RD) who has been trained by the [employer]. The chef or RD has a clear understanding of the importance of minimizing - if not completely eliminating - added fat, sugar, and sodium in recipes. Throughout the series of Cooking School Workshop sessions, patients will learn about healthy ingredients and efficient methods of cooking to build confidence in their capability to prepare    Cooking School weekly topics:  Adding Flavor- Sodium-Free  Fast and Healthy Breakfasts  Powerhouse Plant-Based Proteins  Satisfying Salads and Dressings  Simple Sides and Sauces  International Cuisine-Spotlight on the [employer] Zones  Delicious Desserts  Savory Soups  [employer] - Meals in a Astronomer Appetizers and Snacks  Comforting Weekend Breakfasts  One-Pot Wonders   Fast Evening Meals  Landscape Architect Your Pritikin Plate  WORKSHOPS   Healthy Mindset (Psychosocial):  Focused Goals, Sustainable Changes Clinical staff led group  instruction and group discussion with PowerPoint presentation and patient guidebook. To enhance the learning environment the use of posters, models and videos may be added. Patients will be able to apply effective goal setting strategies to establish at least one personal goal, and then take consistent, meaningful action toward that goal. They will learn to identify common barriers to achieving personal goals and develop strategies to overcome them. Patients will also gain an understanding of how our mind-set can impact our ability to achieve goals and the importance of cultivating a positive and growth-oriented mind-set. The purpose of this lesson is to provide patients with a deeper understanding of how to set and achieve personal goals, as well as the tools  and strategies needed to overcome common obstacles which may arise along the way.  From Head to Heart: The Power of a Healthy Outlook  Clinical staff led group instruction and group discussion with PowerPoint presentation and patient guidebook. To enhance the learning environment the use of posters, models and videos may be added. Patients will be able to recognize and describe the impact of emotions and mood on physical health. They will discover the importance of self-care and explore self-care practices which may work for them. Patients will also learn how to utilize the 4 C's to cultivate a healthier outlook and better manage stress and challenges. The purpose of this lesson is to demonstrate to patients how a healthy outlook is an essential part of maintaining good health, especially as they continue their cardiac rehab journey.  Healthy Sleep for a Healthy Heart Clinical staff led group instruction and group discussion with PowerPoint presentation and patient guidebook. To enhance the learning environment the use of posters, models and videos may be added. At the conclusion of this workshop, patients will be able to demonstrate knowledge of the importance of sleep to overall health, well-being, and quality of life. They will understand the symptoms of, and treatments for, common sleep disorders. Patients will also be able to identify daytime and nighttime behaviors which impact sleep, and they will be able to apply these tools to help manage sleep-related challenges. The purpose of this lesson is to provide patients with a general overview of sleep and outline the importance of quality sleep. Patients will learn about a few of the most common sleep disorders. Patients will also be introduced to the concept of "sleep hygiene," and discover ways to self-manage certain sleeping problems through simple daily behavior changes. Finally, the workshop will motivate patients by clarifying the links between  quality sleep and their goals of heart-healthy living.   Recognizing and Reducing Stress Clinical staff led group instruction and group discussion with PowerPoint presentation and patient guidebook. To enhance the learning environment the use of posters, models and videos may be added. At the conclusion of this workshop, patients will be able to understand the types of stress reactions, differentiate between acute and chronic stress, and recognize the impact that chronic stress has on their health. They will also be able to apply different coping mechanisms, such as reframing negative self-talk. Patients will have the opportunity to practice a variety of stress management techniques, such as deep abdominal breathing, progressive muscle relaxation, and/or guided imagery.  The purpose of this lesson is to educate patients on the role of stress in their lives and to provide healthy techniques for coping with it.  Learning Barriers/Preferences:  Learning Barriers/Preferences - 11/08/24 1229       Learning Barriers/Preferences   Learning Barriers None  Learning Preferences Audio;Skilled Demonstration;Computer/Internet;Verbal Instruction;Group Instruction;Video;Individual Instruction;Written Material;Pictoral          Education Topics:  Knowledge Questionnaire Score:  Knowledge Questionnaire Score - 11/08/24 1229       Knowledge Questionnaire Score   Pre Score 18/28          Core Components/Risk Factors/Patient Goals at Admission:  Personal Goals and Risk Factors at Admission - 11/08/24 1231       Core Components/Risk Factors/Patient Goals on Admission   Tobacco Cessation Yes    Number of packs per day 10    Intervention Assist the participant in steps to quit. Provide individualized education and counseling about committing to Tobacco Cessation, relapse prevention, and pharmacological support that can be provided by physician.;Education officer, environmental, assist with locating and  accessing local/national Quit Smoking programs, and support quit date choice.    Expected Outcomes Short Term: Will demonstrate readiness to quit, by selecting a quit date.;Short Term: Will quit all tobacco product use, adhering to prevention of relapse plan.;Long Term: Complete abstinence from all tobacco products for at least 12 months from quit date.    Lipids Yes    Intervention Provide education and support for participant on nutrition & aerobic/resistive exercise along with prescribed medications to achieve LDL 70mg , HDL >40mg .    Expected Outcomes Short Term: Participant states understanding of desired cholesterol values and is compliant with medications prescribed. Participant is following exercise prescription and nutrition guidelines.;Long Term: Cholesterol controlled with medications as prescribed, with individualized exercise RX and with personalized nutrition plan. Value goals: LDL < 70mg , HDL > 40 mg.          Core Components/Risk Factors/Patient Goals Review:    Core Components/Risk Factors/Patient Goals at Discharge (Final Review):    ITP Comments:  ITP Comments     Row Name 11/08/24 1016           ITP Comments Wilbert Bihari, MD: Medical Director. Introduction to the Pritikin Education Program/Intensive Cardiac Rehab. Initial orientation packet reviewed with the patient.          Comments: Participant attended orientation for the cardiac rehabilitation program on  11/08/2024  to perform initial intake and exercise walk test. Patient introduced to the Pritikin Program education and orientation packet was reviewed. Completed 6-minute walk test, measurements, initial ITP, and exercise prescription. Vital signs stable. Telemetry-normal sinus rhythm, asymptomatic.   Service time was from 10:08 to 12:00

## 2024-11-08 NOTE — Telephone Encounter (Signed)
-----   Message from [facility] sent at 11/08/2024 12:42 PM EST ----- Regarding: RE: ? GXT needed for this pt to participate in Cardiac Rehab I will request correction.  He does not need GXT.  Okay to start cardiac rehab.  Thanks MJP ----- Message ----- From: Bernett Saturnino NOVAK, RN Sent: 11/08/2024  11:04 AM EST To: Newman JINNY Lawrence, MD Subject: ? GXT needed for this pt to participate in C#   Dr. 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 You indicated on the [facility] reimbursement form that this pt needed GXT. Checking with you to confirm a GXT is needed and if so it would need to be scheduled from your office.  We will hold Cardiac rehab until it is completed  Thanks so much for your input  Saturnino Bernett RN, BSN Cardiac and Pulmonary Rehab Nurse Navigator

## 2024-11-14 ENCOUNTER — Telehealth (HOSPITAL_COMMUNITY): Payer: Self-pay

## 2024-11-14 ENCOUNTER — Encounter (HOSPITAL_COMMUNITY): Admission: RE | Admit: 2024-11-14

## 2024-11-14 NOTE — Telephone Encounter (Signed)
 Patient c/o for 12:30 CR class due to dr appt.

## 2024-11-15 ENCOUNTER — Encounter (HOSPITAL_COMMUNITY): Payer: Self-pay

## 2024-11-15 DIAGNOSIS — I214 Non-ST elevation (NSTEMI) myocardial infarction: Secondary | ICD-10-CM

## 2024-11-15 NOTE — Progress Notes (Signed)
 Cardiac Individual Treatment Plan  Patient Details  Name: Gregory Grant MRN: [id number] Date of Birth: [birth year] Referring Provider:   Flowsheet Row CARDIAC REHAB PHASE II ORIENTATION from 11/08/2024 in [facility]  Referring Provider Newman Lawrence, MD    Initial Encounter Date:  Flowsheet Row CARDIAC REHAB PHASE II ORIENTATION from 11/08/2024 in [facility]  Date 11/08/24    Visit Diagnosis: No diagnosis found.  Patient's Home Medications on Admission:  Current Outpatient Medications:    aspirin  EC 81 MG tablet, Take 1 tablet (81 mg total) by mouth daily. Swallow whole., Disp: 30 tablet, Rfl: 12   bictegravir-emtricitabine -tenofovir  AF (BIKTARVY ) 50-200-25 MG TABS tablet, Take 1 tablet by mouth daily., Disp: 30 tablet, Rfl: 6   carvedilol  (COREG ) 6.25 MG tablet, Take 1 tablet (6.25 mg total) by mouth 2 (two) times daily. (Patient taking differently: Take 6.25 mg by mouth daily.), Disp: 180 tablet, Rfl: 3   ezetimibe  (ZETIA ) 10 MG tablet, Take 1 tablet (10 mg total) by mouth daily., Disp: 90 tablet, Rfl: 3   nitroGLYCERIN  (NITROSTAT ) 0.4 MG SL tablet, Place 1 tablet (0.4 mg total) under the tongue every 5 (five) minutes as needed for chest pain., Disp: 25 tablet, Rfl: 4   prasugrel  (EFFIENT ) 10 MG TABS tablet, Take 1 tablet (10 mg total) by mouth daily., Disp: 90 tablet, Rfl: 3   rosuvastatin  (CRESTOR ) 40 MG tablet, Take 1 tablet (40 mg total) by mouth daily., Disp: 90 tablet, Rfl: 3  Past Medical History: Past Medical History:  Diagnosis Date   Coughing 02/2015   GERD (gastroesophageal reflux disease)    HIV (human immunodeficiency virus infection) (HCC)    Peripheral arterial disease     Tobacco Use: Social History   Tobacco Use  Smoking Status Every Day   Current packs/day: 0.50   Average packs/day: 0.5 packs/day for 30.0 years (15.0 ttl pk-yrs)    Types: Cigarettes  Smokeless Tobacco Never    Labs: Review Flowsheet  More data exists      Latest Ref Rng & Units 04/12/2024 05/13/2024 08/09/2024 09/05/2024 09/06/2024  Labs for ITP Cardiac and Pulmonary Rehab  Cholestrol 0 - 200 mg/dL 826  - 867  - 93   LDL (calc) 0 - 99 mg/dL 881  - 67  - 45   HDL-C >40 mg/dL 40  - 48  - 32   Trlycerides <150 mg/dL 76  - 90  - 80   Hemoglobin A1c 4.8 - 5.6 % 5.4  - - - -  TCO2 22 - 32 mmol/L 21  23  - 18  -     Exercise Target Goals: Exercise Program Goal: Individual exercise prescription set using results from initial 6 min walk test and THRR while considering  patient's activity barriers and safety.   Exercise Prescription Goal: Initial exercise prescription builds to 30-45 minutes a day of aerobic activity, 2-3 days per week.  Home exercise guidelines will be given to patient during program as part of exercise prescription that the participant will acknowledge.   Education: Aerobic Exercise: - Group verbal and visual presentation on the components of exercise prescription. Introduces F.I.T.T principle from ACSM for exercise prescriptions.  Reviews F.I.T.T. principles of aerobic exercise including progression. Written material provided at class time.   Education: Resistance Exercise: - Group verbal and visual presentation on the components of exercise prescription. Introduces F.I.T.T principle from ACSM for exercise prescriptions  Reviews F.I.T.T. principles of resistance exercise including progression. Written material provided at class time.    Education: Exercise & Equipment Safety: - Individual verbal instruction and demonstration of equipment use and safety with use of the equipment.   Education: Exercise Physiology & General Exercise Guidelines: - Group verbal and written instruction with models to review the exercise physiology of the cardiovascular system and associated critical values. Provides general exercise guidelines with  specific guidelines to those with heart or lung disease. Written material provided at class time.   Education: Flexibility, Balance, Mind/Body Relaxation: - Group verbal and visual presentation with interactive activity on the components of exercise prescription. Introduces F.I.T.T principle from ACSM for exercise prescriptions. Reviews F.I.T.T. principles of flexibility and balance exercise training including progression. Also discusses the mind body connection.  Reviews various relaxation techniques to help reduce and manage stress (i.e. Deep breathing, progressive muscle relaxation, and visualization). Balance handout provided to take home. Written material provided at class time.   Activity Barriers & Risk Stratification:  Activity Barriers & Cardiac Risk Stratification - 11/08/24 1223       Activity Barriers & Cardiac Risk Stratification   Activity Barriers Back Problems;Arthritis;Neck/Spine Problems;Deconditioning;Other (comment)    Comments PAD, right leg needs stenting due to blockage    Cardiac Risk Stratification High          6 Minute Walk:  6 Minute Walk     Row Name 11/08/24 1145         6 Minute Walk   Phase Initial     Distance 1187 feet     Walk Time 6 minutes     # of Rest Breaks 0     MPH 2.25     METS 3.5     RPE 9     Perceived Dyspnea  0     VO2 Peak 12.2     Symptoms Yes (comment)     Comments Rt calf pain 10/10, resolved with rest     Resting HR 73 bpm     Resting BP 124/88     Resting Oxygen Saturation  98 %     Exercise Oxygen Saturation  during 6 min walk 97 %     Max Ex. HR 94 bpm     Max Ex. BP 140/80     2 Minute Post BP 128/80        Oxygen Initial Assessment:   Oxygen Re-Evaluation:   Oxygen Discharge (Final Oxygen Re-Evaluation):   Initial Exercise Prescription:  Initial Exercise Prescription - 11/08/24 1200       Date of Initial Exercise RX and Referring Provider   Date 11/08/24    Referring Provider Newman Lawrence,  MD    Expected Discharge Date 02/02/24      Bike   Level 1.5    Watts 39    Minutes 15    METs 3.5      Arm Ergometer   Level 1.5    Watts 25    RPM 60    Minutes 15    METs 3.5      Prescription Details   Frequency (times per week) 3    Duration Progress to 30 minutes of continuous aerobic without signs/symptoms of physical distress      Intensity   THRR 40-80% of Max Heartrate 66-132    Ratings of Perceived Exertion 11-13    Perceived Dyspnea 0-4      Progression   Progression Continue progressive overload as per policy without  signs/symptoms or physical distress.      Resistance Training   Training Prescription Yes    Weight 4 lbs    Reps 10-15          Perform Capillary Blood Glucose checks as needed.  Exercise Prescription Changes:   Exercise Comments:   Exercise Goals and Review:   Exercise Goals     Row Name 11/08/24 1118             Exercise Goals   Increase Physical Activity Yes       Intervention Provide advice, education, support and counseling about physical activity/exercise needs.;Develop an individualized exercise prescription for aerobic and resistive training based on initial evaluation findings, risk stratification, comorbidities and participant's personal goals.       Expected Outcomes Short Term: Attend rehab on a regular basis to increase amount of physical activity.;Long Term: Add in home exercise to make exercise part of routine and to increase amount of physical activity.;Long Term: Exercising regularly at least 3-5 days a week.       Increase Strength and Stamina Yes       Intervention Provide advice, education, support and counseling about physical activity/exercise needs.;Develop an individualized exercise prescription for aerobic and resistive training based on initial evaluation findings, risk stratification, comorbidities and participant's personal goals.       Expected Outcomes Short Term: Increase workloads from initial  exercise prescription for resistance, speed, and METs.;Short Term: Perform resistance training exercises routinely during rehab and add in resistance training at home;Long Term: Improve cardiorespiratory fitness, muscular endurance and strength as measured by increased METs and functional capacity ( )       Able to understand and use rate of perceived exertion (RPE) scale Yes       Intervention Provide education and explanation on how to use RPE scale       Expected Outcomes Short Term: Able to use RPE daily in rehab to express subjective intensity level;Long Term:  Able to use RPE to guide intensity level when exercising independently       Knowledge and understanding of Target Heart Rate Range (THRR) Yes       Intervention Provide education and explanation of THRR including how the numbers were predicted and where they are located for reference       Expected Outcomes Short Term: Able to state/look up THRR;Short Term: Able to use daily as guideline for intensity in rehab;Long Term: Able to use THRR to govern intensity when exercising independently       Understanding of Exercise Prescription Yes       Intervention Provide education, explanation, and written materials on patient's individual exercise prescription       Expected Outcomes Short Term: Able to explain program exercise prescription;Long Term: Able to explain home exercise prescription to exercise independently          Exercise Goals Re-Evaluation :   Discharge Exercise Prescription (Final Exercise Prescription Changes):   Nutrition:  Target Goals: Understanding of nutrition guidelines, daily intake of sodium 1500mg , cholesterol 200mg , calories 30% from fat and 7% or less from saturated fats, daily to have 5 or more servings of fruits and vegetables.  Education: Nutrition 1 -Group instruction provided by verbal, written material, interactive activities, discussions, models, and posters to present general guidelines for heart  healthy nutrition including macronutrients, label reading, and promoting whole foods over processed counterparts. Education serves as pensions consultant of discussion of heart healthy eating for all. Written material provided at class time.  Education: Nutrition 2 -Group instruction provided by verbal, written material, interactive activities, discussions, models, and posters to present general guidelines for heart healthy nutrition including sodium, cholesterol, and saturated fat. Providing guidance of habit forming to improve blood pressure, cholesterol, and body weight. Written material provided at class time.     Biometrics:  Pre Biometrics - 11/08/24 1100       Pre Biometrics   Waist Circumference 39 inches    Hip Circumference 40 inches    Waist to Hip Ratio 0.98 %    Triceps Skinfold 12 mm    % Body Fat 25.8 %    Grip Strength 26 kg    Flexibility 15.5 in    Single Leg Stand 17.8 seconds           Nutrition Therapy Plan and Nutrition Goals:   Nutrition Assessments:  MEDIFICTS Score Key: >=70 Need to make dietary changes  40-70 Heart Healthy Diet <= 40 Therapeutic Level Cholesterol Diet   Picture Your Plate Scores: <59 Unhealthy dietary pattern with much room for improvement. 41-50 Dietary pattern unlikely to meet recommendations for good health and room for improvement. 51-60 More healthful dietary pattern, with some room for improvement.  >60 Healthy dietary pattern, although there may be some specific behaviors that could be improved.    Nutrition Goals Re-Evaluation:   Nutrition Goals Discharge (Final Nutrition Goals Re-Evaluation):   Psychosocial: Target Goals: Acknowledge presence or absence of significant depression and/or stress, maximize coping skills, provide positive support system. Participant is able to verbalize types and ability to use techniques and skills needed for reducing stress and depression.   Education: Stress, Anxiety, and Depression -  Group verbal and visual presentation to define topics covered.  Reviews how body is impacted by stress, anxiety, and depression.  Also discusses healthy ways to reduce stress and to treat/manage anxiety and depression. Written material provided at class time.   Education: Sleep Hygiene -Provides group verbal and written instruction about how sleep can affect your health.  Define sleep hygiene, discuss sleep cycles and impact of sleep habits. Review good sleep hygiene tips.   Initial Review & Psychosocial Screening:  Initial Psych Review & Screening - 11/08/24 1026       Initial Review   Current issues with None Identified      Family Dynamics   Good Support System? Yes   Pt has his fiance, step-brother and daughter as a part of his support system     Screening Interventions   Interventions Encouraged to exercise;Provide feedback about the scores to participant;To provide support and resources with identified psychosocial needs    Expected Outcomes Long Term Goal: Stressors or current issues are controlled or eliminated.;Short Term goal: Identification and review with participant of any Quality of Life or Depression concerns found by scoring the questionnaire.;Long Term goal: The participant improves quality of Life and PHQ9 Scores as seen by post scores and/or verbalization of changes          Quality of Life Scores:   Quality of Life - 11/08/24 1118       Quality of Life   Select Quality of Life      Quality of Life Scores   Health/Function Pre 27.57 %    Socioeconomic Pre 30 %    Psych/Spiritual Pre 30 %    Family Pre 30 %    GLOBAL Pre 28.96 %         Scores of 19 and below usually indicate a poorer quality  of life in these areas.  A difference of  2-3 points is a clinically meaningful difference.  A difference of 2-3 points in the total score of the Quality of Life Index has been associated with significant improvement in overall quality of life, self-image, physical  symptoms, and general health in studies assessing change in quality of life.  PHQ-9: Review Flowsheet  More data exists      11/08/2024 08/23/2024 02/19/2024 09/04/2023 03/12/2023  Depression screen PHQ 2/9  Decreased Interest 0 3 0 0 0  Down, Depressed, Hopeless 0 0 0 0 0  PHQ - 2 Score 0 3 0 0 0  Altered sleeping 3 2 - - -  Tired, decreased energy 0 3 - - -  Change in appetite 0 0 - - -  Feeling bad or failure about yourself  0 1 - - -  Trouble concentrating 0 0 - - -  Moving slowly or fidgety/restless 1 0 - - -  Suicidal thoughts 0 1 - - -  PHQ-9 Score 4 10  - - -  Difficult doing work/chores Not difficult at all Not difficult at all - - -    Details       Data saved with a previous flowsheet row definition        Interpretation of Total Score  Total Score Depression Severity:  1-4 = Minimal depression, 5-9 = Mild depression, 10-14 = Moderate depression, 15-19 = Moderately severe depression, 20-27 = Severe depression   Psychosocial Evaluation and Intervention:   Psychosocial Re-Evaluation:  Psychosocial Re-Evaluation     Row Name 11/15/24 1446             Psychosocial Re-Evaluation   Current issues with None Identified       Interventions Relaxation education;Encouraged to attend Cardiac Rehabilitation for the exercise;Stress management education       Continue Psychosocial Services  Follow up required by staff          Psychosocial Discharge (Final Psychosocial Re-Evaluation):  Psychosocial Re-Evaluation - 11/15/24 1446       Psychosocial Re-Evaluation   Current issues with None Identified    Interventions Relaxation education;Encouraged to attend Cardiac Rehabilitation for the exercise;Stress management education    Continue Psychosocial Services  Follow up required by staff          Vocational Rehabilitation: Provide vocational rehab assistance to qualifying candidates.   Vocational Rehab Evaluation & Intervention:  Vocational Rehab - 11/08/24 1229        Initial Vocational Rehab Evaluation & Intervention   Assessment shows need for Vocational Rehabilitation --   Pt has applied for disability         Education: Education Goals: Education classes will be provided on a variety of topics geared toward better understanding of heart health and risk factor modification. Participant will state understanding/return demonstration of topics presented as noted by education test scores.  Learning Barriers/Preferences:  Learning Barriers/Preferences - 11/08/24 1229       Learning Barriers/Preferences   Learning Barriers None    Learning Preferences Audio;Skilled Demonstration;Computer/Internet;Verbal Instruction;Group Instruction;Video;Individual Instruction;Written Material;Pictoral          General Cardiac Education Topics:  AED/CPR: - Group verbal and written instruction with the use of models to demonstrate the basic use of the AED with the basic ABC's of resuscitation.   Test and Procedures: - Group verbal and visual presentation and models provide information about basic cardiac anatomy and function. Reviews the testing methods done to diagnose heart disease  and the outcomes of the test results. Describes the treatment choices: Medical Management, Angioplasty, or Coronary Bypass Surgery for treating various heart conditions including Myocardial Infarction, Angina, Valve Disease, and Cardiac Arrhythmias. Written material provided at class time.   Medication Safety: - Group verbal and visual instruction to review commonly prescribed medications for heart and lung disease. Reviews the medication, class of the drug, and side effects. Includes the steps to properly store meds and maintain the prescription regimen. Written material provided at class time.   Intimacy: - Group verbal instruction through game format to discuss how heart and lung disease can affect sexual intimacy. Written material provided at class time.   Know Your  Numbers and Heart Failure: - Group verbal and visual instruction to discuss disease risk factors for cardiac and pulmonary disease and treatment options.  Reviews associated critical values for Overweight/Obesity, Hypertension, Cholesterol, and Diabetes.  Discusses basics of heart failure: signs/symptoms and treatments.  Introduces Heart Failure Zone chart for action plan for heart failure. Written material provided at class time.   Infection Prevention: - Provides verbal and written material to individual with discussion of infection control including proper hand washing and proper equipment cleaning during exercise session.   Falls Prevention: - Provides verbal and written material to individual with discussion of falls prevention and safety.   Other: -Provides group and verbal instruction on various topics (see comments)   Knowledge Questionnaire Score:  Knowledge Questionnaire Score - 11/08/24 1229       Knowledge Questionnaire Score   Pre Score 18/28          Core Components/Risk Factors/Patient Goals at Admission:  Personal Goals and Risk Factors at Admission - 11/08/24 1231       Core Components/Risk Factors/Patient Goals on Admission   Tobacco Cessation Yes    Number of packs per day 10    Intervention Assist the participant in steps to quit. Provide individualized education and counseling about committing to Tobacco Cessation, relapse prevention, and pharmacological support that can be provided by physician.;Education officer, environmental, assist with locating and accessing local/national Quit Smoking programs, and support quit date choice.    Expected Outcomes Short Term: Will demonstrate readiness to quit, by selecting a quit date.;Short Term: Will quit all tobacco product use, adhering to prevention of relapse plan.;Long Term: Complete abstinence from all tobacco products for at least 12 months from quit date.    Lipids Yes    Intervention Provide education and support  for participant on nutrition & aerobic/resistive exercise along with prescribed medications to achieve LDL 70mg , HDL >40mg .    Expected Outcomes Short Term: Participant states understanding of desired cholesterol values and is compliant with medications prescribed. Participant is following exercise prescription and nutrition guidelines.;Long Term: Cholesterol controlled with medications as prescribed, with individualized exercise RX and with personalized nutrition plan. Value goals: LDL < 70mg , HDL > 40 mg.          Education:Diabetes - Individual verbal and written instruction to review signs/symptoms of diabetes, desired ranges of glucose level fasting, after meals and with exercise. Acknowledge that pre and post exercise glucose checks will be done for 3 sessions at entry of program.   Core Components/Risk Factors/Patient Goals Review:   Goals and Risk Factor Review     Row Name 11/15/24 1448             Core Components/Risk Factors/Patient Goals Review   Personal Goals Review Tobacco Cessation;Lipids       Review Johnathin' orientation was  on 11/08/24 and he will be starting his first day of cardiac rehab exercise classes tomorrow (11/16/24).       Expected Outcomes Jamall will begin and continue to particpate in cardiac rehab for exercise nutrition and lifestyle modifications.          Core Components/Risk Factors/Patient Goals at Discharge (Final Review):   Goals and Risk Factor Review - 11/15/24 1448       Core Components/Risk Factors/Patient Goals Review   Personal Goals Review Tobacco Cessation;Lipids    Review Johnattan' orientation was on 11/08/24 and he will be starting his first day of cardiac rehab exercise classes tomorrow (11/16/24).    Expected Outcomes Jakarius will begin and continue to particpate in cardiac rehab for exercise nutrition and lifestyle modifications.          ITP Comments:  ITP Comments     Row Name 11/08/24 1016 11/15/24 1445         ITP Comments  Wilbert Bihari, MD: Medical Director. Introduction to the [facility] Education Program/Intensive Cardiac Rehab. Initial orientation packet reviewed with the patient. 30 Day ITP Review. Shamir is to start his first day of cardiac rehab exercise class tomorrow (11/16/24)         Comments: see ITP comments

## 2024-11-16 ENCOUNTER — Encounter (HOSPITAL_COMMUNITY)
Admission: RE | Admit: 2024-11-16 | Discharge: 2024-11-16 | Disposition: A | Source: Ambulatory Visit | Attending: Cardiology

## 2024-11-16 DIAGNOSIS — I214 Non-ST elevation (NSTEMI) myocardial infarction: Secondary | ICD-10-CM | POA: Insufficient documentation

## 2024-11-16 NOTE — Progress Notes (Signed)
 Daily Session Note  Patient Details  Name: Gregory Grant MRN: [id number] Date of Birth: 08/08/1966 Referring Provider:   Flowsheet Row CARDIAC REHAB PHASE II ORIENTATION from 11/08/2024 in [facility]  Referring Provider Newman Lawrence, MD    Encounter Date: 11/16/2024  Check In:  Session Check In - 11/16/24 1319       Check-In   Supervising physician immediately available to respond to emergencies [facility] MD immediately available    Physician(s) Damien Braver, NP    Location [facility]    Staff Present Alm Parkins, MS, ACSM-CEP, CCRP, Exercise Physiologist;Natthew Marlatt Lennon, RN, BSN;Jetta Walker BS, ACSM-CEP, Exercise Physiologist;Maria Whitaker, RN, Avonne Gal, MS, ACSM-CEP, Exercise Physiologist    Virtual Visit No    Medication changes reported     No    Fall or balance concerns reported    No    Tobacco Cessation No Change    Warm-up and Cool-down Performed as group-led instruction    Resistance Training Performed No    VAD Patient? No    PAD/SET Patient? No      Pain Assessment   Currently in Pain? No/denies    Pain Score 0-No pain    Multiple Pain Sites No          Capillary Blood Glucose: No results found for this or any previous visit (from the past 24 hours).   Exercise Prescription Changes - 11/16/24 1500       Response to Exercise   Blood Pressure (Admit) 130/80    Blood Pressure (Exercise) 124/80    Blood Pressure (Exit) 124/80    Heart Rate (Admit) 75 bpm    Heart Rate (Exercise) 92 bpm    Heart Rate (Exit) 78 bpm    Rating of Perceived Exertion (Exercise) 14    Symptoms groin pain on bike    Comments Pt's first day in the [facility]2 program    Duration Progress to 30 minutes of  aerobic without signs/symptoms of physical distress    Intensity THRR unchanged      Progression   Progression Continue to progress workloads to maintain intensity without signs/symptoms of  physical distress.    Average METs 2.1      Resistance Training   Training Prescription No    Weight No wts on wednesdays      Interval Training   Interval Training No      Bike   Level 1.5    Watts 18    Minutes 11    METs 2.4      Arm Ergometer   Level 1    Watts 16    RPM 48    Minutes 15    METs 1.8          Social History   Tobacco Use  Smoking Status Every Day   Current packs/day: 0.50   Average packs/day: 0.5 packs/day for 30.0 years (15.0 ttl pk-yrs)   Types: Cigarettes  Smokeless Tobacco Never    Goals Met:  Exercise tolerated well No report of concerns or symptoms today  Goals Unmet:  Not Applicable  Comments: Pt started cardiac rehab today.  Pt tolerated light exercise without difficulty. VSS, telemetry-sinus rhythm, asymptomatic.  Medication list reconciled. Pt denies barriers to medication compliance.  PSYCHOSOCIAL ASSESSMENT:  PHQ-4. Pt exhibits positive coping skills, hopeful outlook with supportive family. No psychosocial needs identified at this time, no psychosocial interventions necessary.  Pt oriented to exercise equipment and  routine.    Understanding verbalized.     Dr. Wilbert Bihari is Medical Director for Cardiac Rehab at [facility].

## 2024-11-18 ENCOUNTER — Telehealth (HOSPITAL_COMMUNITY): Payer: Self-pay

## 2024-11-18 ENCOUNTER — Encounter (HOSPITAL_COMMUNITY): Admission: RE | Admit: 2024-11-18

## 2024-11-18 NOTE — Telephone Encounter (Signed)
 Patient c/o for 12:30 CR class due to weather.

## 2024-11-21 ENCOUNTER — Encounter (HOSPITAL_COMMUNITY)

## 2024-11-21 ENCOUNTER — Telehealth (HOSPITAL_COMMUNITY): Payer: Self-pay

## 2024-11-21 NOTE — Telephone Encounter (Signed)
 Patient c/o for 12:30 CR class due to weather.

## 2024-11-23 ENCOUNTER — Encounter (HOSPITAL_COMMUNITY)
Admission: RE | Admit: 2024-11-23 | Discharge: 2024-11-23 | Disposition: A | Discharge status: Home / Self Care | Source: Ambulatory Visit | Attending: Cardiology | Admitting: Cardiology

## 2024-11-23 DIAGNOSIS — I214 Non-ST elevation (NSTEMI) myocardial infarction: Secondary | ICD-10-CM | POA: Diagnosis not present

## 2024-11-25 ENCOUNTER — Encounter (HOSPITAL_COMMUNITY): Payer: Self-pay | Admitting: Cardiology

## 2024-11-25 ENCOUNTER — Encounter (HOSPITAL_COMMUNITY)
Admission: RE | Admit: 2024-11-25 | Discharge: 2024-11-25 | Disposition: A | Source: Ambulatory Visit | Attending: Cardiology

## 2024-11-25 ENCOUNTER — Ambulatory Visit (HOSPITAL_COMMUNITY): Admission: RE | Admit: 2024-11-25 | Source: Ambulatory Visit

## 2024-11-25 DIAGNOSIS — I214 Non-ST elevation (NSTEMI) myocardial infarction: Secondary | ICD-10-CM | POA: Diagnosis not present

## 2024-11-28 ENCOUNTER — Encounter (HOSPITAL_COMMUNITY)
Admission: RE | Admit: 2024-11-28 | Discharge: 2024-11-28 | Disposition: A | Discharge status: Home / Self Care | Source: Ambulatory Visit | Attending: Cardiology | Admitting: Cardiology

## 2024-11-28 DIAGNOSIS — I214 Non-ST elevation (NSTEMI) myocardial infarction: Secondary | ICD-10-CM | POA: Diagnosis not present

## 2024-11-30 ENCOUNTER — Encounter (HOSPITAL_COMMUNITY): Admission: RE | Admit: 2024-11-30 | Source: Ambulatory Visit

## 2024-11-30 ENCOUNTER — Telehealth (HOSPITAL_COMMUNITY): Payer: Self-pay

## 2024-11-30 ENCOUNTER — Ambulatory Visit: Admitting: Cardiology

## 2024-11-30 NOTE — Telephone Encounter (Signed)
 Patient c/o for 12:30 CR class, no reason given.

## 2024-12-02 ENCOUNTER — Encounter (HOSPITAL_COMMUNITY)
Admission: RE | Admit: 2024-12-02 | Discharge: 2024-12-02 | Disposition: A | Discharge status: Home / Self Care | Source: Ambulatory Visit | Attending: Cardiology | Admitting: Cardiology

## 2024-12-02 ENCOUNTER — Encounter (HOSPITAL_COMMUNITY)
Admission: RE | Admit: 2024-12-02 | Discharge: 2024-12-02 | Disposition: A | Source: Ambulatory Visit | Attending: Cardiology

## 2024-12-02 DIAGNOSIS — I214 Non-ST elevation (NSTEMI) myocardial infarction: Secondary | ICD-10-CM | POA: Diagnosis not present

## 2024-12-05 ENCOUNTER — Encounter (HOSPITAL_COMMUNITY)

## 2024-12-05 NOTE — Progress Notes (Signed)
 The ASCVD Risk score (Arnett DK, et al., 2019) failed to calculate for the following reasons:   Risk score cannot be calculated because patient has a medical history suggesting prior/existing ASCVD   * - Cholesterol units were assumed  Gregory Grant, BSN, [facility]

## 2024-12-07 ENCOUNTER — Encounter (HOSPITAL_COMMUNITY)

## 2024-12-09 ENCOUNTER — Encounter (HOSPITAL_COMMUNITY)

## 2024-12-12 ENCOUNTER — Encounter (HOSPITAL_COMMUNITY)

## 2024-12-13 NOTE — Progress Notes (Signed)
 Cardiac Individual Treatment Plan  Patient Details  Name: Gregory Grant MRN: [id number] Date of Birth: 11-Jul-1966 Referring Provider:   Flowsheet Row CARDIAC REHAB PHASE II ORIENTATION from 11/08/2024 in [facility]  Referring Provider Newman Lawrence, MD    Initial Encounter Date:  Flowsheet Row CARDIAC REHAB PHASE II ORIENTATION from 11/08/2024 in [facility] for Heart, Vascular, & Lung Health  Date 11/08/24    Visit Diagnosis: No diagnosis found.  Patient's Home Medications on Admission: Current Medications[1]  Past Medical History: Past Medical History:  Diagnosis Date   Coughing 02/2015   GERD (gastroesophageal reflux disease)    HIV (human immunodeficiency virus infection) (HCC)    Peripheral arterial disease     Tobacco Use: Tobacco Use History[2]  Labs: Review Flowsheet  More data exists      Latest Ref Rng & Units 04/12/2024 05/13/2024 08/09/2024 09/05/2024 09/06/2024  Labs for ITP Cardiac and Pulmonary Rehab  Cholestrol 0 - 200 mg/dL 826  - 867  - 93   LDL (calc) 0 - 99 mg/dL 881  - 67  - 45   HDL-C >40 mg/dL 40  - 48  - 32   Trlycerides <150 mg/dL 76  - 90  - 80   Hemoglobin A1c 4.8 - 5.6 % 5.4  - - - -  TCO2 22 - 32 mmol/L 21  23  - 18  -    Capillary Blood Glucose: Lab Results  Component Value Date   GLUCAP 86 03/30/2018     Exercise Target Goals: Exercise Program Goal: Individual exercise prescription set using results from initial 6 min walk test and THRR while considering  patients activity barriers and safety.   Exercise Prescription Goal: Initial exercise prescription builds to 30-45 minutes a day of aerobic activity, 2-3 days per week.  Home exercise guidelines will be given to patient during program as part of exercise prescription that the participant will acknowledge.  Activity Barriers & Risk Stratification:  Activity Barriers & Cardiac Risk  Stratification - 11/08/24 1223       Activity Barriers & Cardiac Risk Stratification   Activity Barriers Back Problems;Arthritis;Neck/Spine Problems;Deconditioning;Other (comment)    Comments PAD, right leg needs stenting due to blockage    Cardiac Risk Stratification High          6 Minute Walk:  6 Minute Walk     Row Name 11/08/24 1145         6 Minute Walk   Phase Initial     Distance 1187 feet     Walk Time 6 minutes     # of Rest Breaks 0     MPH 2.25     METS 3.5     RPE 9     Perceived Dyspnea  0     VO2 Peak 12.2     Symptoms Yes (comment)     Comments Rt calf pain 10/10, resolved with rest     Resting HR 73 bpm     Resting BP 124/88     Resting Oxygen Saturation  98 %     Exercise Oxygen Saturation  during 6 min walk 97 %     Max Ex. HR 94 bpm     Max Ex. BP 140/80     2 Minute Post BP 128/80        Oxygen Initial Assessment:   Oxygen Re-Evaluation:   Oxygen Discharge (Final Oxygen Re-Evaluation):  Initial Exercise Prescription:  Initial Exercise Prescription - 11/08/24 1200       Date of Initial Exercise RX and Referring Provider   Date 11/08/24    Referring Provider Newman Lawrence, MD    Expected Discharge Date 02/02/24      Bike   Level 1.5    Watts 39    Minutes 15    METs 3.5      Arm Ergometer   Level 1.5    Watts 25    RPM 60    Minutes 15    METs 3.5      Prescription Details   Frequency (times per week) 3    Duration Progress to 30 minutes of continuous aerobic without signs/symptoms of physical distress      Intensity   THRR 40-80% of Max Heartrate 66-132    Ratings of Perceived Exertion 11-13    Perceived Dyspnea 0-4      Progression   Progression Continue progressive overload as per policy without signs/symptoms or physical distress.      Resistance Training   Training Prescription Yes    Weight 4 lbs    Reps 10-15          Perform Capillary Blood Glucose checks as needed.  Exercise Prescription  Changes:   Exercise Prescription Changes     Row Name 11/16/24 1500 12/02/24 1500           Response to Exercise   Blood Pressure (Admit) 130/80 122/72      Blood Pressure (Exercise) 124/80 126/80      Blood Pressure (Exit) 124/80 120/78      Heart Rate (Admit) 75 bpm 75 bpm      Heart Rate (Exercise) 92 bpm 96 bpm      Heart Rate (Exit) 78 bpm 77 bpm      Rating of Perceived Exertion (Exercise) 14 17      Symptoms groin pain on bike 9/10 left groin hip pain on Octane      Comments Pt's first day in the CRP2 program Reviewed METs      Duration Progress to 30 minutes of  aerobic without signs/symptoms of physical distress Continue with 30 min of aerobic exercise without signs/symptoms of physical distress.      Intensity THRR unchanged THRR unchanged        Progression   Progression Continue to progress workloads to maintain intensity without signs/symptoms of physical distress. Continue to progress workloads to maintain intensity without signs/symptoms of physical distress.      Average METs 2.1 1.5        Resistance Training   Training Prescription No --      Weight No wts on wednesdays No wts had to leave early        Interval Training   Interval Training No No        Bike   Level 1.5 --      Watts 18 --      Minutes 11 --      METs 2.4 --        Arm Ergometer   Level 1 1.5      Watts 16 14      RPM 48 49      Minutes 15 15      METs 1.8 2        Recumbant Elliptical   Level -- 2      Minutes -- 5      METs -- 1  groin pain         Exercise Comments:   Exercise Comments     Row Name 11/16/24 1513 12/02/24 1557 12/02/24 1559       Exercise Comments Pt's first day in the CRP2 program. Pt c/o rt groin pain on bike and only did 11 minutes. Will consider a diffrent modaility. Reviewed METs. Pt attendance is sporadic. METs are inconsistant. Only 4 session of data to review. --        Exercise Goals and Review:   Exercise Goals     Row Name 11/08/24 1118              Exercise Goals   Increase Physical Activity Yes       Intervention Provide advice, education, support and counseling about physical activity/exercise needs.;Develop an individualized exercise prescription for aerobic and resistive training based on initial evaluation findings, risk stratification, comorbidities and participant's personal goals.       Expected Outcomes Short Term: Attend rehab on a regular basis to increase amount of physical activity.;Long Term: Add in home exercise to make exercise part of routine and to increase amount of physical activity.;Long Term: Exercising regularly at least 3-5 days a week.       Increase Strength and Stamina Yes       Intervention Provide advice, education, support and counseling about physical activity/exercise needs.;Develop an individualized exercise prescription for aerobic and resistive training based on initial evaluation findings, risk stratification, comorbidities and participant's personal goals.       Expected Outcomes Short Term: Increase workloads from initial exercise prescription for resistance, speed, and METs.;Short Term: Perform resistance training exercises routinely during rehab and add in resistance training at home;Long Term: Improve cardiorespiratory fitness, muscular endurance and strength as measured by increased METs and functional capacity ( )       Able to understand and use rate of perceived exertion (RPE) scale Yes       Intervention Provide education and explanation on how to use RPE scale       Expected Outcomes Short Term: Able to use RPE daily in rehab to express subjective intensity level;Long Term:  Able to use RPE to guide intensity level when exercising independently       Knowledge and understanding of Target Heart Rate Range (THRR) Yes       Intervention Provide education and explanation of THRR including how the numbers were predicted and where they are located for reference       Expected Outcomes  Short Term: Able to state/look up THRR;Short Term: Able to use daily as guideline for intensity in rehab;Long Term: Able to use THRR to govern intensity when exercising independently       Understanding of Exercise Prescription Yes       Intervention Provide education, explanation, and written materials on patient's individual exercise prescription       Expected Outcomes Short Term: Able to explain program exercise prescription;Long Term: Able to explain home exercise prescription to exercise independently          Exercise Goals Re-Evaluation :  Exercise Goals Re-Evaluation     Row Name 11/16/24 1510             Exercise Goal Re-Evaluation   Exercise Goals Review Increase Physical Activity;Increase Strength and Stamina;Able to understand and use rate of perceived exertion (RPE) scale;Knowledge and understanding of Target Heart Rate Range (THRR);Understanding of Exercise Prescription       Comments First day in the CRP2 program.  Pt understands the exercise Rx, RPe scale, and THRR.       Expected Outcomes Will continue to monitor and progress exercise workloads as tolerated.          Discharge Exercise Prescription (Final Exercise Prescription Changes):  Exercise Prescription Changes - 12/02/24 1500       Response to Exercise   Blood Pressure (Admit) 122/72    Blood Pressure (Exercise) 126/80    Blood Pressure (Exit) 120/78    Heart Rate (Admit) 75 bpm    Heart Rate (Exercise) 96 bpm    Heart Rate (Exit) 77 bpm    Rating of Perceived Exertion (Exercise) 17    Symptoms 9/10 left groin hip pain on Octane    Comments Reviewed METs    Duration Continue with 30 min of aerobic exercise without signs/symptoms of physical distress.    Intensity THRR unchanged      Progression   Progression Continue to progress workloads to maintain intensity without signs/symptoms of physical distress.    Average METs 1.5      Resistance Training   Weight No wts had to leave early      Interval  Training   Interval Training No      Arm Ergometer   Level 1.5    Watts 14    RPM 49    Minutes 15    METs 2      Recumbant Elliptical   Level 2    Minutes 5    METs 1   groin pain         Nutrition:  Target Goals: Understanding of nutrition guidelines, daily intake of sodium 1500mg , cholesterol 200mg , calories 30% from fat and 7% or less from saturated fats, daily to have 5 or more servings of fruits and vegetables.  Biometrics:  Pre Biometrics - 11/08/24 1100       Pre Biometrics   Waist Circumference 39 inches    Hip Circumference 40 inches    Waist to Hip Ratio 0.98 %    Triceps Skinfold 12 mm    % Body Fat 25.8 %    Grip Strength 26 kg    Flexibility 15.5 in    Single Leg Stand 17.8 seconds           Nutrition Therapy Plan and Nutrition Goals:  Nutrition Therapy & Goals - 11/23/24 1622       Nutrition Therapy   Diet Heart Healthy    Drug/Food Interactions Statins/Certain Fruits      Personal Nutrition Goals   Nutrition Goal Patient to improve diet quality by using the plate method as a guide for meal planning to include lean protein/plant protein, fruits, vegetables, whole grains, nonfat dairy as part of a well-balanced diet.    Comments Patient with medical history of HIV on treatment, nicotine dependence, CAD, PAD, s/p PCI to left circumflex for NSTEMI in 05/2024. Pt reports restricing fried foods over past 6 months. Lipids currently well controlled based on labs on 09/06/2024. RD reviewed heart healthy diet using plate method. Discussed opting for lean sources of protein as diet currently high in red/processed meats. Patient will benefit from participation in cardiac rehab for nutrition education, exercise, and lifestyle modification.      Intervention Plan   Intervention Prescribe, educate and counsel regarding individualized specific dietary modifications aiming towards targeted core components such as weight, hypertension, lipid management, diabetes,  heart failure and other comorbidities.    Expected Outcomes Short Term Goal: Understand basic principles of  dietary content, such as calories, fat, sodium, cholesterol and nutrients.;Long Term Goal: Adherence to prescribed nutrition plan.          Nutrition Assessments:  MEDIFICTS Score Key: >=70 Need to make dietary changes  40-70 Heart Healthy Diet <= 40 Therapeutic Level Cholesterol Diet   Flowsheet Row CARDIAC REHAB PHASE II EXERCISE from 11/23/2024 in [facility] for Heart, Vascular, & Lung Health  Picture Your Plate Total Score on Admission 62   Picture Your Plate Scores: <59 Unhealthy dietary pattern with much room for improvement. 41-50 Dietary pattern unlikely to meet recommendations for good health and room for improvement. 51-60 More healthful dietary pattern, with some room for improvement.  >60 Healthy dietary pattern, although there may be some specific behaviors that could be improved.    Nutrition Goals Re-Evaluation:   Nutrition Goals Re-Evaluation:   Nutrition Goals Discharge (Final Nutrition Goals Re-Evaluation):   Psychosocial: Target Goals: Acknowledge presence or absence of significant depression and/or stress, maximize coping skills, provide positive support system. Participant is able to verbalize types and ability to use techniques and skills needed for reducing stress and depression.  Initial Review & Psychosocial Screening:  Initial Psych Review & Screening - 11/08/24 1026       Initial Review   Current issues with None Identified      Family Dynamics   Good Support System? Yes   Pt has his fiance, step-brother and daughter as a part of his support system     Screening Interventions   Interventions Encouraged to exercise;Provide feedback about the scores to participant;To provide support and resources with identified psychosocial needs    Expected Outcomes Long Term Goal: Stressors or current issues are controlled or  eliminated.;Short Term goal: Identification and review with participant of any Quality of Life or Depression concerns found by scoring the questionnaire.;Long Term goal: The participant improves quality of Life and PHQ9 Scores as seen by post scores and/or verbalization of changes          Quality of Life Scores:  Quality of Life - 11/08/24 1118       Quality of Life   Select Quality of Life      Quality of Life Scores   Health/Function Pre 27.57 %    Socioeconomic Pre 30 %    Psych/Spiritual Pre 30 %    Family Pre 30 %    GLOBAL Pre 28.96 %         Scores of 19 and below usually indicate a poorer quality of life in these areas.  A difference of  2-3 points is a clinically meaningful difference.  A difference of 2-3 points in the total score of the Quality of Life Index has been associated with significant improvement in overall quality of life, self-image, physical symptoms, and general health in studies assessing change in quality of life.  PHQ-9: Review Flowsheet  More data exists      11/08/2024 08/23/2024 02/19/2024 09/04/2023 03/12/2023  Depression screen PHQ 2/9  Decreased Interest 0 3 0 0 0  Down, Depressed, Hopeless 0 0 0 0 0  PHQ - 2 Score 0 3 0 0 0  Altered sleeping 3 2 - - -  Tired, decreased energy 0 3 - - -  Change in appetite 0 0 - - -  Feeling bad or failure about yourself  0 1 - - -  Trouble concentrating 0 0 - - -  Moving slowly or fidgety/restless 1 0 - - -  Suicidal thoughts 0 1 - - -  PHQ-9 Score 4 10  - - -  Difficult doing work/chores Not difficult at all Not difficult at all - - -    Details       Data saved with a previous flowsheet row definition        Interpretation of Total Score  Total Score Depression Severity:  1-4 = Minimal depression, 5-9 = Mild depression, 10-14 = Moderate depression, 15-19 = Moderately severe depression, 20-27 = Severe depression   Psychosocial Evaluation and Intervention:   Psychosocial Re-Evaluation:   Psychosocial Re-Evaluation     Row Name 11/15/24 1446 12/13/24 1359           Psychosocial Re-Evaluation   Current issues with None Identified None Identified      Interventions Relaxation education;Encouraged to attend Cardiac Rehabilitation for the exercise;Stress management education Relaxation education;Encouraged to attend Cardiac Rehabilitation for the exercise;Stress management education      Continue Psychosocial Services  Follow up required by staff Follow up required by staff         Psychosocial Discharge (Final Psychosocial Re-Evaluation):  Psychosocial Re-Evaluation - 12/13/24 1359       Psychosocial Re-Evaluation   Current issues with None Identified    Interventions Relaxation education;Encouraged to attend Cardiac Rehabilitation for the exercise;Stress management education    Continue Psychosocial Services  Follow up required by staff          Vocational Rehabilitation: Provide vocational rehab assistance to qualifying candidates.   Vocational Rehab Evaluation & Intervention:  Vocational Rehab - 11/08/24 1229       Initial Vocational Rehab Evaluation & Intervention   Assessment shows need for Vocational Rehabilitation --   Pt has applied for disability         Education: Education Goals: Education classes will be provided on a weekly basis, covering required topics. Participant will state understanding/return demonstration of topics presented.     Core Videos: Exercise    Move It!  Clinical staff conducted group or individual video education with verbal and written material and guidebook.  Patient learns the recommended Pritikin exercise program. Exercise with the goal of living a long, healthy life. Some of the health benefits of exercise include controlled diabetes, healthier blood pressure levels, improved cholesterol levels, improved heart and lung capacity, improved sleep, and better body composition. Everyone should speak with their doctor  before starting or changing an exercise routine.  Biomechanical Limitations Clinical staff conducted group or individual video education with verbal and written material and guidebook.  Patient learns how biomechanical limitations can impact exercise and how we can mitigate and possibly overcome limitations to have an impactful and balanced exercise routine.  Body Composition Clinical staff conducted group or individual video education with verbal and written material and guidebook.  Patient learns that body composition (ratio of muscle mass to fat mass) is a key component to assessing overall fitness, rather than body weight alone. Increased fat mass, especially visceral belly fat, can put us  at increased risk for metabolic syndrome, type 2 diabetes, heart disease, and even death. It is recommended to combine diet and exercise (cardiovascular and resistance training) to improve your body composition. Seek guidance from your physician and exercise physiologist before implementing an exercise routine.  Exercise Action Plan Clinical staff conducted group or individual video education with verbal and written material and guidebook.  Patient learns the recommended strategies to achieve and enjoy long-term exercise adherence, including variety, self-motivation, self-efficacy, and positive decision making.  Benefits of exercise include fitness, good health, weight management, more energy, better sleep, less stress, and overall well-being.  Medical   Heart Disease Risk Reduction Clinical staff conducted group or individual video education with verbal and written material and guidebook.  Patient learns our heart is our most vital organ as it circulates oxygen, nutrients, white blood cells, and hormones throughout the entire body, and carries waste away. Data supports a plant-based eating plan like the Pritikin Program for its effectiveness in slowing progression of and reversing heart disease. The video  provides a number of recommendations to address heart disease.   Metabolic Syndrome and Belly Fat  Clinical staff conducted group or individual video education with verbal and written material and guidebook.  Patient learns what metabolic syndrome is, how it leads to heart disease, and how one can reverse it and keep it from coming back. You have metabolic syndrome if you have 3 of the following 5 criteria: abdominal obesity, high blood pressure, high triglycerides, low HDL cholesterol, and high blood sugar.  Hypertension and Heart Disease Clinical staff conducted group or individual video education with verbal and written material and guidebook.  Patient learns that high blood pressure, or hypertension, is very common in the [place] . Hypertension is largely due to excessive salt intake, but other important risk factors include being overweight, physical inactivity, drinking too much alcohol, smoking, and not eating enough potassium from fruits and vegetables. High blood pressure is a leading risk factor for heart attack, stroke, congestive heart failure, dementia, kidney failure, and premature death. Long-term effects of excessive salt intake include stiffening of the arteries and thickening of heart muscle and organ damage. Recommendations include ways to reduce hypertension and the risk of heart disease.  Diseases of Our Time - Focusing on Diabetes Clinical staff conducted group or individual video education with verbal and written material and guidebook.  Patient learns why the best way to stop diseases of our time is prevention, through food and other lifestyle changes. Medicine (such as prescription pills and surgeries) is often only a Band-Aid on the problem, not a long-term solution. Most common diseases of our time include obesity, type 2 diabetes, hypertension, heart disease, and cancer. The Pritikin Program is recommended and has been proven to help reduce, reverse, and/or prevent the  damaging effects of metabolic syndrome.  Nutrition   Overview of the Pritikin Eating Plan  Clinical staff conducted group or individual video education with verbal and written material and guidebook.  Patient learns about the Pritikin Eating Plan for disease risk reduction. The Pritikin Eating Plan emphasizes a wide variety of unrefined, minimally-processed carbohydrates, like fruits, vegetables, whole grains, and legumes. Go, Caution, and Stop food choices are explained. Plant-based and lean animal proteins are emphasized. Rationale provided for low sodium intake for blood pressure control, low added sugars for blood sugar stabilization, and low added fats and oils for coronary artery disease risk reduction and weight management.  Calorie Density  Clinical staff conducted group or individual video education with verbal and written material and guidebook.  Patient learns about calorie density and how it impacts the Pritikin Eating Plan. Knowing the characteristics of the food you choose will help you decide whether those foods will lead to weight gain or weight loss, and whether you want to consume more or less of them. Weight loss is usually a side effect of the Pritikin Eating Plan because of its focus on low calorie-dense foods.  Label Reading  Clinical staff conducted group or individual  video education with verbal and written material and guidebook.  Patient learns about the Pritikin recommended label reading guidelines and corresponding recommendations regarding calorie density, added sugars, sodium content, and whole grains.  Dining Out - Part 1  Clinical staff conducted group or individual video education with verbal and written material and guidebook.  Patient learns that restaurant meals can be sabotaging because they can be so high in calories, fat, sodium, and/or sugar. Patient learns recommended strategies on how to positively address this and avoid unhealthy pitfalls.  Facts on Fats   Clinical staff conducted group or individual video education with verbal and written material and guidebook.  Patient learns that lifestyle modifications can be just as effective, if not more so, as many medications for lowering your risk of heart disease. A Pritikin lifestyle can help to reduce your risk of inflammation and atherosclerosis (cholesterol build-up, or plaque, in the artery walls). Lifestyle interventions such as dietary choices and physical activity address the cause of atherosclerosis. A review of the types of fats and their impact on blood cholesterol levels, along with dietary recommendations to reduce fat intake is also included.  Nutrition Action Plan  Clinical staff conducted group or individual video education with verbal and written material and guidebook.  Patient learns how to incorporate Pritikin recommendations into their lifestyle. Recommendations include planning and keeping personal health goals in mind as an important part of their success.  Healthy Mind-Set    Healthy Minds, Bodies, Hearts  Clinical staff conducted group or individual video education with verbal and written material and guidebook.  Patient learns how to identify when they are stressed. Video will discuss the impact of that stress, as well as the many benefits of stress management. Patient will also be introduced to stress management techniques. The way we think, act, and feel has an impact on our hearts.  How Our Thoughts Can Heal Our Hearts  Clinical staff conducted group or individual video education with verbal and written material and guidebook.  Patient learns that negative thoughts can cause depression and anxiety. This can result in negative lifestyle behavior and serious health problems. Cognitive behavioral therapy is an effective method to help control our thoughts in order to change and improve our emotional outlook.  Additional Videos:  Exercise    Improving Performance  Clinical  staff conducted group or individual video education with verbal and written material and guidebook.  Patient learns to use a non-linear approach by alternating intensity levels and lengths of time spent exercising to help burn more calories and lose more body fat. Cardiovascular exercise helps improve heart health, metabolism, hormonal balance, blood sugar control, and recovery from fatigue. Resistance training improves strength, endurance, balance, coordination, reaction time, metabolism, and muscle mass. Flexibility exercise improves circulation, posture, and balance. Seek guidance from your physician and exercise physiologist before implementing an exercise routine and learn your capabilities and proper form for all exercise.  Introduction to Yoga  Clinical staff conducted group or individual video education with verbal and written material and guidebook.  Patient learns about yoga, a discipline of the coming together of mind, breath, and body. The benefits of yoga include improved flexibility, improved range of motion, better posture and core strength, increased lung function, weight loss, and positive self-image. Yogas heart health benefits include lowered blood pressure, healthier heart rate, decreased cholesterol and triglyceride levels, improved immune function, and reduced stress. Seek guidance from your physician and exercise physiologist before implementing an exercise routine and learn your capabilities and proper  form for all exercise.  Medical   Aging: Enhancing Your Quality of Life  Clinical staff conducted group or individual video education with verbal and written material and guidebook.  Patient learns key strategies and recommendations to stay in good physical health and enhance quality of life, such as prevention strategies, having an advocate, securing a Health Care Proxy and Power of Attorney, and keeping a list of medications and system for tracking them. It also discusses how to  avoid risk for bone loss.  Biology of Weight Control  Clinical staff conducted group or individual video education with verbal and written material and guidebook.  Patient learns that weight gain occurs because we consume more calories than we burn (eating more, moving less). Even if your body weight is normal, you may have higher ratios of fat compared to muscle mass. Too much body fat puts you at increased risk for cardiovascular disease, heart attack, stroke, type 2 diabetes, and obesity-related cancers. In addition to exercise, following the Pritikin Eating Plan can help reduce your risk.  Decoding Lab Results  Clinical staff conducted group or individual video education with verbal and written material and guidebook.  Patient learns that lab test reflects one measurement whose values change over time and are influenced by many factors, including medication, stress, sleep, exercise, food, hydration, pre-existing medical conditions, and more. It is recommended to use the knowledge from this video to become more involved with your lab results and evaluate your numbers to speak with your doctor.   Diseases of Our Time - Overview  Clinical staff conducted group or individual video education with verbal and written material and guidebook.  Patient learns that according to the CDC, 50% to 70% of chronic diseases (such as obesity, type 2 diabetes, elevated lipids, hypertension, and heart disease) are avoidable through lifestyle improvements including healthier food choices, listening to satiety cues, and increased physical activity.  Sleep Disorders Clinical staff conducted group or individual video education with verbal and written material and guidebook.  Patient learns how good quality and duration of sleep are important to overall health and well-being. Patient also learns about sleep disorders and how they impact health along with recommendations to address them, including discussing with a  physician.  Nutrition  Dining Out - Part 2 Clinical staff conducted group or individual video education with verbal and written material and guidebook.  Patient learns how to plan ahead and communicate in order to maximize their dining experience in a healthy and nutritious manner. Included are recommended food choices based on the type of restaurant the patient is visiting.   Fueling a Banker conducted group or individual video education with verbal and written material and guidebook.  There is a strong connection between our food choices and our health. Diseases like obesity and type 2 diabetes are very prevalent and are in large-part due to lifestyle choices. The Pritikin Eating Plan provides plenty of food and hunger-curbing satisfaction. It is easy to follow, affordable, and helps reduce health risks.  Menu Workshop  Clinical staff conducted group or individual video education with verbal and written material and guidebook.  Patient learns that restaurant meals can sabotage health goals because they are often packed with calories, fat, sodium, and sugar. Recommendations include strategies to plan ahead and to communicate with the manager, chef, or server to help order a healthier meal.  Planning Your Eating Strategy  Clinical staff conducted group or individual video education with verbal and written material and guidebook.  Patient learns about the Pritikin Eating Plan and its benefit of reducing the risk of disease. The Pritikin Eating Plan does not focus on calories. Instead, it emphasizes high-quality, nutrient-rich foods. By knowing the characteristics of the foods, we choose, we can determine their calorie density and make informed decisions.  Targeting Your Nutrition Priorities  Clinical staff conducted group or individual video education with verbal and written material and guidebook.  Patient learns that lifestyle habits have a tremendous impact on disease  risk and progression. This video provides eating and physical activity recommendations based on your personal health goals, such as reducing LDL cholesterol, losing weight, preventing or controlling type 2 diabetes, and reducing high blood pressure.  Vitamins and Minerals  Clinical staff conducted group or individual video education with verbal and written material and guidebook.  Patient learns different ways to obtain key vitamins and minerals, including through a recommended healthy diet. It is important to discuss all supplements you take with your doctor.   Healthy Mind-Set    Smoking Cessation  Clinical staff conducted group or individual video education with verbal and written material and guidebook.  Patient learns that cigarette smoking and tobacco addiction pose a serious health risk which affects millions of people. Stopping smoking will significantly reduce the risk of heart disease, lung disease, and many forms of cancer. Recommended strategies for quitting are covered, including working with your doctor to develop a successful plan.  Culinary   Becoming a Set Designer conducted group or individual video education with verbal and written material and guidebook.  Patient learns that cooking at home can be healthy, cost-effective, quick, and puts them in control. Keys to cooking healthy recipes will include looking at your recipe, assessing your equipment needs, planning ahead, making it simple, choosing cost-effective seasonal ingredients, and limiting the use of added fats, salts, and sugars.  Cooking - Breakfast and Snacks  Clinical staff conducted group or individual video education with verbal and written material and guidebook.  Patient learns how important breakfast is to satiety and nutrition through the entire day. Recommendations include key foods to eat during breakfast to help stabilize blood sugar levels and to prevent overeating at meals later in the day.  Planning ahead is also a key component.  Cooking - Educational Psychologist conducted group or individual video education with verbal and written material and guidebook.  Patient learns eating strategies to improve overall health, including an approach to cook more at home. Recommendations include thinking of animal protein as a side on your plate rather than center stage and focusing instead on lower calorie dense options like vegetables, fruits, whole grains, and plant-based proteins, such as beans. Making sauces in large quantities to freeze for later and leaving the skin on your vegetables are also recommended to maximize your experience.  Cooking - Healthy Salads and Dressing Clinical staff conducted group or individual video education with verbal and written material and guidebook.  Patient learns that vegetables, fruits, whole grains, and legumes are the foundations of the Pritikin Eating Plan. Recommendations include how to incorporate each of these in flavorful and healthy salads, and how to create homemade salad dressings. Proper handling of ingredients is also covered. Cooking - Soups and [employer] - Soups and Desserts Clinical staff conducted group or individual video education with verbal and written material and guidebook.  Patient learns that Pritikin soups and desserts make for easy, nutritious, and delicious snacks and meal components that are low  in sodium, fat, sugar, and calorie density, while high in vitamins, minerals, and filling fiber. Recommendations include simple and healthy ideas for soups and desserts.   Overview     The Pritikin Solution Program Overview Clinical staff conducted group or individual video education with verbal and written material and guidebook.  Patient learns that the results of the Pritikin Program have been documented in more than 100 articles published in peer-reviewed journals, and the benefits include reducing risk factors for  (and, in some cases, even reversing) high cholesterol, high blood pressure, type 2 diabetes, obesity, and more! An overview of the three key pillars of the Pritikin Program will be covered: eating well, doing regular exercise, and having a healthy mind-set.  WORKSHOPS  Exercise: Exercise Basics: Building Your Action Plan Clinical staff led group instruction and group discussion with PowerPoint presentation and patient guidebook. To enhance the learning environment the use of posters, models and videos may be added. At the conclusion of this workshop, patients will comprehend the difference between physical activity and exercise, as well as the benefits of incorporating both, into their routine. Patients will understand the FITT (Frequency, Intensity, Time, and Type) principle and how to use it to build an exercise action plan. In addition, safety concerns and other considerations for exercise and cardiac rehab will be addressed by the presenter. The purpose of this lesson is to promote a comprehensive and effective weekly exercise routine in order to improve patients overall level of fitness.   Managing Heart Disease: Your Path to a Healthier Heart Clinical staff led group instruction and group discussion with PowerPoint presentation and patient guidebook. To enhance the learning environment the use of posters, models and videos may be added.At the conclusion of this workshop, patients will understand the anatomy and physiology of the heart. Additionally, they will understand how Pritikins three pillars impact the risk factors, the progression, and the management of heart disease.  The purpose of this lesson is to provide a high-level overview of the heart, heart disease, and how the Pritikin lifestyle positively impacts risk factors.  Exercise Biomechanics Clinical staff led group instruction and group discussion with PowerPoint presentation and patient guidebook. To enhance the learning  environment the use of posters, models and videos may be added. Patients will learn how the structural parts of their bodies function and how these functions impact their daily activities, movement, and exercise. Patients will learn how to promote a neutral spine, learn how to manage pain, and identify ways to improve their physical movement in order to promote healthy living. The purpose of this lesson is to expose patients to common physical limitations that impact physical activity. Participants will learn practical ways to adapt and manage aches and pains, and to minimize their effect on regular exercise. Patients will learn how to maintain good posture while sitting, walking, and lifting.  Balance Training and Fall Prevention  Clinical staff led group instruction and group discussion with PowerPoint presentation and patient guidebook. To enhance the learning environment the use of posters, models and videos may be added. At the conclusion of this workshop, patients will understand the importance of their sensorimotor skills (vision, proprioception, and the vestibular system) in maintaining their ability to balance as they age. Patients will apply a variety of balancing exercises that are appropriate for their current level of function. Patients will understand the common causes for poor balance, possible solutions to these problems, and ways to modify their physical environment in order to minimize their fall risk. The  purpose of this lesson is to teach patients about the importance of maintaining balance as they age and ways to minimize their risk of falling.  WORKSHOPS   Nutrition:  Fueling a Ship Broker led group instruction and group discussion with PowerPoint presentation and patient guidebook. To enhance the learning environment the use of posters, models and videos may be added. Patients will review the foundational principles of the Pritikin Eating Plan and understand  what constitutes a serving size in each of the food groups. Patients will also learn Pritikin-friendly foods that are better choices when away from home and review make-ahead meal and snack options. Calorie density will be reviewed and applied to three nutrition priorities: weight maintenance, weight loss, and weight gain. The purpose of this lesson is to reinforce (in a group setting) the key concepts around what patients are recommended to eat and how to apply these guidelines when away from home by planning and selecting Pritikin-friendly options. Patients will understand how calorie density may be adjusted for different weight management goals.  Mindful Eating  Clinical staff led group instruction and group discussion with PowerPoint presentation and patient guidebook. To enhance the learning environment the use of posters, models and videos may be added. Patients will briefly review the concepts of the Pritikin Eating Plan and the importance of low-calorie dense foods. The concept of mindful eating will be introduced as well as the importance of paying attention to internal hunger signals. Triggers for non-hunger eating and techniques for dealing with triggers will be explored. The purpose of this lesson is to provide patients with the opportunity to review the basic principles of the Pritikin Eating Plan, discuss the value of eating mindfully and how to measure internal cues of hunger and fullness using the Hunger Scale. Patients will also discuss reasons for non-hunger eating and learn strategies to use for controlling emotional eating.  Targeting Your Nutrition Priorities Clinical staff led group instruction and group discussion with PowerPoint presentation and patient guidebook. To enhance the learning environment the use of posters, models and videos may be added. Patients will learn how to determine their genetic susceptibility to disease by reviewing their family history. Patients will gain  insight into the importance of diet as part of an overall healthy lifestyle in mitigating the impact of genetics and other environmental insults. The purpose of this lesson is to provide patients with the opportunity to assess their personal nutrition priorities by looking at their family history, their own health history and current risk factors. Patients will also be able to discuss ways of prioritizing and modifying the Pritikin Eating Plan for their highest risk areas  Menu  Clinical staff led group instruction and group discussion with PowerPoint presentation and patient guidebook. To enhance the learning environment the use of posters, models and videos may be added. Using menus brought in from [employer], or printed from [employer], patients will apply the Pritikin dining out guidelines that were presented in the [employer] video. Patients will also be able to practice these guidelines in a variety of provided scenarios. The purpose of this lesson is to provide patients with the opportunity to practice hands-on learning of the Pritikin Dining Out guidelines with actual menus and practice scenarios.  Label Reading Clinical staff led group instruction and group discussion with PowerPoint presentation and patient guidebook. To enhance the learning environment the use of posters, models and videos may be added. Patients will review and discuss the Pritikin label reading guidelines  presented in Pritikins Label Reading Educational series video. Using fool labels brought in from local grocery stores and markets, patients will apply the label reading guidelines and determine if the packaged food meet the Pritikin guidelines. The purpose of this lesson is to provide patients with the opportunity to review, discuss, and practice hands-on learning of the Pritikin Label Reading guidelines with actual packaged food labels. Cooking School  Pritikins [employer] are  designed to teach patients ways to prepare quick, simple, and affordable recipes at home. The importance of nutritions role in chronic disease risk reduction is reflected in its emphasis in the overall Pritikin program. By learning how to prepare essential core Pritikin Eating Plan recipes, patients will increase control over what they eat; be able to customize the flavor of foods without the use of added salt, sugar, or fat; and improve the quality of the food they consume. By learning a set of core recipes which are easily assembled, quickly prepared, and affordable, patients are more likely to prepare more healthy foods at home. These workshops focus on convenient breakfasts, simple entres, side dishes, and desserts which can be prepared with minimal effort and are consistent with nutrition recommendations for cardiovascular risk reduction. Cooking [employer] are taught by a armed forces logistics/support/administrative officer (RD) who has been trained by the [employer]. The chef or RD has a clear understanding of the importance of minimizing - if not completely eliminating - added fat, sugar, and sodium in recipes. Throughout the series of Cooking School Workshop sessions, patients will learn about healthy ingredients and efficient methods of cooking to build confidence in their capability to prepare    Cooking School weekly topics:  Adding Flavor- Sodium-Free  Fast and Healthy Breakfasts  Powerhouse Plant-Based Proteins  Satisfying Salads and Dressings  Simple Sides and Sauces  International Cuisine-Spotlight on the [employer] Zones  Delicious Desserts  Savory Soups  [employer] - Meals in a Astronomer Appetizers and Snacks  Comforting Weekend Breakfasts  One-Pot Wonders   Fast Evening Meals  Landscape Architect Your Pritikin Plate  WORKSHOPS   Healthy Mindset (Psychosocial):  Focused Goals, Sustainable Changes Clinical staff led group instruction and group discussion  with PowerPoint presentation and patient guidebook. To enhance the learning environment the use of posters, models and videos may be added. Patients will be able to apply effective goal setting strategies to establish at least one personal goal, and then take consistent, meaningful action toward that goal. They will learn to identify common barriers to achieving personal goals and develop strategies to overcome them. Patients will also gain an understanding of how our mind-set can impact our ability to achieve goals and the importance of cultivating a positive and growth-oriented mind-set. The purpose of this lesson is to provide patients with a deeper understanding of how to set and achieve personal goals, as well as the tools and strategies needed to overcome common obstacles which may arise along the way.  From Head to Heart: The Power of a Healthy Outlook  Clinical staff led group instruction and group discussion with PowerPoint presentation and patient guidebook. To enhance the learning environment the use of posters, models and videos may be added. Patients will be able to recognize and describe the impact of emotions and mood on physical health. They will discover the importance of self-care and explore self-care practices which may work for them. Patients will also learn how to utilize the 4 Cs to cultivate a healthier outlook  and better manage stress and challenges. The purpose of this lesson is to demonstrate to patients how a healthy outlook is an essential part of maintaining good health, especially as they continue their cardiac rehab journey.  Healthy Sleep for a Healthy Heart Clinical staff led group instruction and group discussion with PowerPoint presentation and patient guidebook. To enhance the learning environment the use of posters, models and videos may be added. At the conclusion of this workshop, patients will be able to demonstrate knowledge of the importance of sleep to overall  health, well-being, and quality of life. They will understand the symptoms of, and treatments for, common sleep disorders. Patients will also be able to identify daytime and nighttime behaviors which impact sleep, and they will be able to apply these tools to help manage sleep-related challenges. The purpose of this lesson is to provide patients with a general overview of sleep and outline the importance of quality sleep. Patients will learn about a few of the most common sleep disorders. Patients will also be introduced to the concept of sleep hygiene, and discover ways to self-manage certain sleeping problems through simple daily behavior changes. Finally, the workshop will motivate patients by clarifying the links between quality sleep and their goals of heart-healthy living.   Recognizing and Reducing Stress Clinical staff led group instruction and group discussion with PowerPoint presentation and patient guidebook. To enhance the learning environment the use of posters, models and videos may be added. At the conclusion of this workshop, patients will be able to understand the types of stress reactions, differentiate between acute and chronic stress, and recognize the impact that chronic stress has on their health. They will also be able to apply different coping mechanisms, such as reframing negative self-talk. Patients will have the opportunity to practice a variety of stress management techniques, such as deep abdominal breathing, progressive muscle relaxation, and/or guided imagery.  The purpose of this lesson is to educate patients on the role of stress in their lives and to provide healthy techniques for coping with it.  Learning Barriers/Preferences:  Learning Barriers/Preferences - 11/08/24 1229       Learning Barriers/Preferences   Learning Barriers None    Learning Preferences Audio;Skilled Demonstration;Computer/Internet;Verbal Instruction;Group Instruction;Video;Individual  Instruction;Written Material;Pictoral          Education Topics:  Knowledge Questionnaire Score:  Knowledge Questionnaire Score - 11/08/24 1229       Knowledge Questionnaire Score   Pre Score 18/28          Core Components/Risk Factors/Patient Goals at Admission:  Personal Goals and Risk Factors at Admission - 11/08/24 1231       Core Components/Risk Factors/Patient Goals on Admission   Tobacco Cessation Yes    Number of packs per day 10    Intervention Assist the participant in steps to quit. Provide individualized education and counseling about committing to Tobacco Cessation, relapse prevention, and pharmacological support that can be provided by physician.;Education officer, environmental, assist with locating and accessing local/national Quit Smoking programs, and support quit date choice.    Expected Outcomes Short Term: Will demonstrate readiness to quit, by selecting a quit date.;Short Term: Will quit all tobacco product use, adhering to prevention of relapse plan.;Long Term: Complete abstinence from all tobacco products for at least 12 months from quit date.    Lipids Yes    Intervention Provide education and support for participant on nutrition & aerobic/resistive exercise along with prescribed medications to achieve LDL 70mg , HDL >40mg .    Expected  Outcomes Short Term: Participant states understanding of desired cholesterol values and is compliant with medications prescribed. Participant is following exercise prescription and nutrition guidelines.;Long Term: Cholesterol controlled with medications as prescribed, with individualized exercise RX and with personalized nutrition plan. Value goals: LDL < 70mg , HDL > 40 mg.          Core Components/Risk Factors/Patient Goals Review:   Goals and Risk Factor Review     Row Name 11/15/24 1448 12/13/24 1400           Core Components/Risk Factors/Patient Goals Review   Personal Goals Review Tobacco Cessation;Lipids Tobacco  Cessation;Lipids      Review Gregory Grant' orientation was on 11/08/24 and he will be starting his first day of cardiac rehab exercise classes tomorrow (11/16/24). Gregory Grant is doing well with exercise at cardiac rehab since his 11/16/24 start. Vital signs have been stable.      Expected Outcomes Gregory Grant will begin and continue to particpate in cardiac rehab for exercise nutrition and lifestyle modifications. Gregory Grant will continue to particpate in cardiac rehab for exercise, nutrition and lifestyle modifications.         Core Components/Risk Factors/Patient Goals at Discharge (Final Review):   Goals and Risk Factor Review - 12/13/24 1400       Core Components/Risk Factors/Patient Goals Review   Personal Goals Review Tobacco Cessation;Lipids    Review Gregory Grant is doing well with exercise at cardiac rehab since his 11/16/24 start. Vital signs have been stable.    Expected Outcomes Gregory Grant will continue to particpate in cardiac rehab for exercise, nutrition and lifestyle modifications.          ITP Comments:  ITP Comments     Row Name 11/08/24 1016 11/15/24 1445 12/13/24 1358       ITP Comments Gregory Bihari, MD: Medical Director. Introduction to the [facility]/Intensive Cardiac Rehab. Initial orientation packet reviewed with the patient. 30 Day ITP Review. [employer] is to start his first day of cardiac rehab exercise class tomorrow (11/16/24) 30 Day ITP Review. Gregory Grant started cardiac rehab on 11/16/24 and is tolerating exercise well when in attendance        Comments: see ITP comments    [1]  Current Outpatient Medications:    aspirin  EC 81 MG tablet, Take 1 tablet (81 mg total) by mouth daily. Swallow whole., Disp: 30 tablet, Rfl: 12   bictegravir-emtricitabine -tenofovir  AF (BIKTARVY ) 50-200-25 MG TABS tablet, Take 1 tablet by mouth daily., Disp: 30 tablet, Rfl: 6   carvedilol  (COREG ) 6.25 MG tablet, Take 1 tablet (6.25 mg total) by mouth 2 (two) times daily. (Patient taking differently: Take  6.25 mg by mouth daily.), Disp: 180 tablet, Rfl: 3   ezetimibe  (ZETIA ) 10 MG tablet, Take 1 tablet (10 mg total) by mouth daily., Disp: 90 tablet, Rfl: 3   nitroGLYCERIN  (NITROSTAT ) 0.4 MG SL tablet, Place 1 tablet (0.4 mg total) under the tongue every 5 (five) minutes as needed for chest pain., Disp: 25 tablet, Rfl: 4   prasugrel  (EFFIENT ) 10 MG TABS tablet, Take 1 tablet (10 mg total) by mouth daily., Disp: 90 tablet, Rfl: 3   rosuvastatin  (CRESTOR ) 40 MG tablet, Take 1 tablet (40 mg total) by mouth daily., Disp: 90 tablet, Rfl: 3 [2]  Social History Tobacco Use  Smoking Status Every Day   Current packs/day: 0.50   Average packs/day: 0.5 packs/day for 30.0 years (15.0 ttl pk-yrs)   Types: Cigarettes  Smokeless Tobacco Never

## 2024-12-14 ENCOUNTER — Encounter (HOSPITAL_COMMUNITY)

## 2024-12-16 ENCOUNTER — Encounter (HOSPITAL_COMMUNITY)

## 2024-12-19 ENCOUNTER — Encounter (HOSPITAL_COMMUNITY)
Admission: RE | Admit: 2024-12-19 | Discharge: 2024-12-19 | Disposition: A | Discharge status: Home / Self Care | Source: Ambulatory Visit | Attending: Cardiology | Admitting: Cardiology

## 2024-12-19 DIAGNOSIS — I214 Non-ST elevation (NSTEMI) myocardial infarction: Secondary | ICD-10-CM | POA: Diagnosis present

## 2024-12-21 ENCOUNTER — Encounter (HOSPITAL_COMMUNITY): Admission: RE | Admit: 2024-12-21 | Source: Ambulatory Visit

## 2024-12-23 ENCOUNTER — Telehealth (HOSPITAL_COMMUNITY): Payer: Self-pay

## 2024-12-23 ENCOUNTER — Encounter (HOSPITAL_COMMUNITY)

## 2024-12-23 NOTE — Telephone Encounter (Signed)
 Patient c/o for 12:30 CR class, states he has been out of town since Tuesday night for a funeral and will not be back until Saturday.

## 2024-12-26 ENCOUNTER — Encounter (HOSPITAL_COMMUNITY)
Admission: RE | Admit: 2024-12-26 | Discharge: 2024-12-26 | Disposition: A | Source: Ambulatory Visit | Attending: Cardiology

## 2024-12-26 DIAGNOSIS — I214 Non-ST elevation (NSTEMI) myocardial infarction: Secondary | ICD-10-CM | POA: Diagnosis not present

## 2024-12-28 ENCOUNTER — Telehealth (HOSPITAL_COMMUNITY): Payer: Self-pay

## 2024-12-28 ENCOUNTER — Encounter (HOSPITAL_COMMUNITY)

## 2024-12-28 NOTE — Telephone Encounter (Signed)
 Welfare check as pt has had two no calls/no shows.  Message left.  Inquired if pt needed a mental health referral as he had contacted us  and let us  know he had been out of town at a funeral and requested pt to return our phone call and update us .  Phone number given.  Pt also educated on his graduation date and that he has thus far attended 10 cardiac rehab sessions.

## 2024-12-30 ENCOUNTER — Encounter (HOSPITAL_COMMUNITY)
Admission: RE | Admit: 2024-12-30 | Discharge: 2024-12-30 | Disposition: A | Discharge status: Home / Self Care | Source: Ambulatory Visit | Attending: Cardiology | Admitting: Cardiology

## 2024-12-30 DIAGNOSIS — I214 Non-ST elevation (NSTEMI) myocardial infarction: Secondary | ICD-10-CM

## 2025-01-02 ENCOUNTER — Telehealth (HOSPITAL_COMMUNITY): Payer: Self-pay

## 2025-01-02 ENCOUNTER — Encounter (HOSPITAL_COMMUNITY): Admission: RE | Admit: 2025-01-02 | Source: Ambulatory Visit

## 2025-01-02 NOTE — Telephone Encounter (Signed)
 Patient c/o for 12:30 CR class, no reason given.

## 2025-01-04 ENCOUNTER — Telehealth (HOSPITAL_COMMUNITY): Payer: Self-pay

## 2025-01-04 ENCOUNTER — Encounter (HOSPITAL_COMMUNITY)

## 2025-01-04 NOTE — Telephone Encounter (Signed)
 Patient c/o for 12:30 CR class, is out of town.

## 2025-01-06 ENCOUNTER — Telehealth (HOSPITAL_COMMUNITY): Payer: Self-pay

## 2025-01-06 ENCOUNTER — Encounter (HOSPITAL_COMMUNITY): Admission: RE | Admit: 2025-01-06 | Source: Ambulatory Visit

## 2025-01-06 NOTE — Telephone Encounter (Signed)
 Patient c/o for 12:30 CR class, states he has no car today to get here.

## 2025-01-09 ENCOUNTER — Encounter (HOSPITAL_COMMUNITY)

## 2025-01-10 ENCOUNTER — Encounter (HOSPITAL_COMMUNITY): Payer: Self-pay

## 2025-01-10 DIAGNOSIS — I214 Non-ST elevation (NSTEMI) myocardial infarction: Secondary | ICD-10-CM

## 2025-01-10 NOTE — Progress Notes (Signed)
 Cardiac Individual Treatment Plan  Patient Details  Name: Gregory Grant MRN: [id number] Date of Birth: 1966/03/07 Referring Provider:   Flowsheet Row CARDIAC REHAB PHASE II ORIENTATION from 11/08/2024 in [facility], [facility]  Referring Provider Newman Lawrence, MD    Initial Encounter Date:  Flowsheet Row CARDIAC REHAB PHASE II ORIENTATION from 11/08/2024 in [facility] for Heart, Vascular, & Lung [facility]  Date 11/08/24    Visit Diagnosis: 09/05/24 NSTEMI (non-ST elevated myocardial infarction) [facility])  Patient's Home Medications on Admission: Current Medications[1]  Past Medical History: Past Medical History:  Diagnosis Date   Coughing 02/2015   GERD (gastroesophageal reflux disease)    HIV (human immunodeficiency virus infection) (HCC)    Peripheral arterial disease     Tobacco Use: Tobacco Use History[2]  Labs: Review Flowsheet  More data exists      Latest Ref Rng & Units 04/12/2024 05/13/2024 08/09/2024 09/05/2024 09/06/2024  Labs for ITP Cardiac and Pulmonary Rehab  Cholestrol 0 - 200 mg/dL 826  - 867  - 93   LDL (calc) 0 - 99 mg/dL 881  - 67  - 45   HDL-C >40 mg/dL 40  - 48  - 32   Trlycerides <150 mg/dL 76  - 90  - 80   Hemoglobin A1c 4.8 - 5.6 % 5.4  - - - -  TCO2 22 - 32 mmol/L 21  23  - 18  -    Capillary Blood Glucose: Lab Results  Component Value Date   GLUCAP 86 03/30/2018     Exercise Target Goals: Exercise Program Goal: Individual exercise prescription set using results from initial 6 min walk test and THRR while considering  patients activity barriers and safety.   Exercise Prescription Goal: Initial exercise prescription builds to 30-45 minutes a day of aerobic activity, 2-3 days per week.  Home exercise guidelines will be given to patient during program as part of exercise prescription that the participant will acknowledge.  Activity Barriers & Risk  Stratification:   6 Minute Walk:   Oxygen Initial Assessment:   Oxygen Re-Evaluation:   Oxygen Discharge (Final Oxygen Re-Evaluation):   Initial Exercise Prescription:   Perform Capillary Blood Glucose checks as needed.  Exercise Prescription Changes:   Exercise Prescription Changes     Row Name 11/16/24 1500 12/02/24 1500 12/19/24 1500 12/30/24 1500       Response to Exercise   Blood Pressure (Admit) 130/80 122/72 116/62 120/80    Blood Pressure (Exercise) 124/80 126/80 -- 162/92    Blood Pressure (Exit) 124/80 120/78 114/84 128/80    Heart Rate (Admit) 75 bpm 75 bpm 82 bpm 68 bpm    Heart Rate (Exercise) 92 bpm 96 bpm 116 bpm 123 bpm    Heart Rate (Exit) 78 bpm 77 bpm 91 bpm 77 bpm    Rating of Perceived Exertion (Exercise) 14 17 7 8     Symptoms groin pain on bike 9/10 left groin hip pain on [place] None None    Comments Pt's first day in the CRP2 program Reviewed METs Reviewed METs and goals Reviewed METs    Duration Progress to 30 minutes of  aerobic without signs/symptoms of physical distress Continue with 30 min of aerobic exercise without signs/symptoms of physical distress. Continue with 30 min of aerobic exercise without signs/symptoms of physical distress. Continue with 30 min of aerobic exercise without signs/symptoms of physical distress.    Intensity THRR unchanged THRR unchanged THRR unchanged  THRR unchanged      Progression   Progression Continue to progress workloads to maintain intensity without signs/symptoms of physical distress. Continue to progress workloads to maintain intensity without signs/symptoms of physical distress. Continue to progress workloads to maintain intensity without signs/symptoms of physical distress. Continue to progress workloads to maintain intensity without signs/symptoms of physical distress.    Average METs 2.1 1.5 3 2.6      Resistance Training   Training Prescription No -- -- --    Weight No wts on wednesdays No wts had to  leave early 4 lbs 4 lbs    Reps -- -- 10-15 10-15    Time -- -- 5 Minutes 5 Minutes      Interval Training   Interval Training No No No No      Bike   Level 1.5 -- -- --    Watts 18 -- -- --    Minutes 11 -- -- --    METs 2.4 -- -- --      Arm Ergometer   Level 1 1.5 1.5 2.5    Watts 16 14 16 15     RPM 48 49 -- --    Minutes 15 15 -- 15    METs 1.8 2 2.1 1.9      Recumbant Elliptical   Level -- 2 2 2     RPM -- -- 54 54    Watts -- -- 79 73    Minutes -- 5 15 15     METs -- 1  groin pain 3.9 3.3       Exercise Comments:   Exercise Comments     Row Name 11/16/24 1513 12/02/24 1557 12/02/24 1559 12/16/24 1515 12/19/24 1522   Exercise Comments Pt's first day in the CRP2 program. Pt c/o rt groin pain on bike and only did 11 minutes. Will consider a diffrent modaility. Reviewed METs. Pt attendance is sporadic. METs are inconsistant. Only 4 session of data to review. -- Pt due fro review of METs and goals. did not attend. Will complete upon return. Pt is making progress. Poor attendance.    Row Name 12/30/24 1520           Exercise Comments Pt's METs are inconsistant. Encouraged pt to maintain or exceed previous RPM/watts. will continue to encourage pt to push himself given low RPE.          Exercise Goals and Review:   Exercise Goals Re-Evaluation :  Exercise Goals Re-Evaluation     Row Name 11/16/24 1510 12/19/24 1520           Exercise Goal Re-Evaluation   Exercise Goals Review Increase Physical Activity;Increase Strength and Stamina;Able to understand and use rate of perceived exertion (RPE) scale;Knowledge and understanding of Target Heart Rate Range (THRR);Understanding of Exercise Prescription Increase Physical Activity;Increase Strength and Stamina;Able to understand and use rate of perceived exertion (RPE) scale;Knowledge and understanding of Target Heart Rate Range (THRR);Understanding of Exercise Prescription      Comments First day in the CRP2 program. Pt  understands the exercise Rx, RPe scale, and THRR. Reviewed METs and goals. Pt is making progress on METS, but has only attend 6 sessions. Groin is bettter, no pain today. Pt is making progress on his goal of walking walking longer distnaces with less pain.      Expected Outcomes Will continue to monitor and progress exercise workloads as tolerated. Will continue to monitor and progress exercise workloads as tolerated.  Discharge Exercise Prescription (Final Exercise Prescription Changes):  Exercise Prescription Changes - 12/30/24 1500       Response to Exercise   Blood Pressure (Admit) 120/80    Blood Pressure (Exercise) 162/92    Blood Pressure (Exit) 128/80    Heart Rate (Admit) 68 bpm    Heart Rate (Exercise) 123 bpm    Heart Rate (Exit) 77 bpm    Rating of Perceived Exertion (Exercise) 8    Symptoms None    Comments Reviewed METs    Duration Continue with 30 min of aerobic exercise without signs/symptoms of physical distress.    Intensity THRR unchanged      Progression   Progression Continue to progress workloads to maintain intensity without signs/symptoms of physical distress.    Average METs 2.6      Resistance Training   Weight 4 lbs    Reps 10-15    Time 5 Minutes      Interval Training   Interval Training No      Arm Ergometer   Level 2.5    Watts 15    Minutes 15    METs 1.9      Recumbant Elliptical   Level 2    RPM 54    Watts 73    Minutes 15    METs 3.3          Nutrition:  Target Goals: Understanding of nutrition guidelines, daily intake of sodium 1500mg , cholesterol 200mg , calories 30% from fat and 7% or less from saturated fats, daily to have 5 or more servings of fruits and vegetables.  Biometrics:    Nutrition Therapy Plan and Nutrition Goals:  Nutrition Therapy & Goals - 11/23/24 1622       Nutrition Therapy   Diet Heart Healthy    Drug/Food Interactions Statins/Certain Fruits      Personal Nutrition Goals   Nutrition  Goal Patient to improve diet quality by using the plate method as a guide for meal planning to include lean protein/plant protein, fruits, vegetables, whole grains, nonfat dairy as part of a well-balanced diet.    Comments Patient with medical history of HIV on treatment, nicotine dependence, CAD, PAD, s/p PCI to left circumflex for NSTEMI in 05/2024. Pt reports restricing fried foods over past 6 months. Lipids currently well controlled based on labs on 09/06/2024. RD reviewed heart healthy diet using plate method. Discussed opting for lean sources of protein as diet currently high in red/processed meats. Patient will benefit from participation in cardiac rehab for nutrition education, exercise, and lifestyle modification.      Intervention Plan   Intervention Prescribe, educate and counsel regarding individualized specific dietary modifications aiming towards targeted core components such as weight, hypertension, lipid management, diabetes, heart failure and other comorbidities.    Expected Outcomes Short Term Goal: Understand basic principles of dietary content, such as calories, fat, sodium, cholesterol and nutrients.;Long Term Goal: Adherence to prescribed nutrition plan.          Nutrition Assessments:  MEDIFICTS Score Key: >=70 Need to make dietary changes  40-70 Heart Healthy Diet <= 40 Therapeutic Level Cholesterol Diet   Flowsheet Row CARDIAC REHAB PHASE II EXERCISE from 11/23/2024 in [facility]  Picture Your Plate Total Score on Admission 62   Picture Your Plate Scores: <59 Unhealthy dietary pattern with much room for improvement. 41-50 Dietary pattern unlikely to meet recommendations for good health and room for improvement. 51-60 More healthful  dietary pattern, with some room for improvement.  >60 Healthy dietary pattern, although there may be some specific behaviors that could be improved.    Nutrition Goals  Re-Evaluation:  Nutrition Goals Re-Evaluation     Row Name 12/19/24 1307             Goals   Current Weight 181 lb 3.5 oz (82.2 kg)       Nutrition Goal Patient to improve diet quality by using the plate method as a guide for meal planning to include lean protein/plant protein, fruits, vegetables, whole grains, nonfat dairy as part of a well-balanced diet.       Comment No recent labs noted. Wt relatively stable since starting cardiac rehab; < 1% wt loss noted.       Expected Outcome Goals in action. Patient with medical history of HIV on treatment, nicotine dependence, CAD, PAD, s/p PCI to left circumflex for NSTEMI in 05/2024. Pt reports consuming baked fish as primary protein source. Also notes frequent intake of salads with meals. Typically consuming ~ 2 meals daily. RD encouraged pt to add snacks between meals to help maintain appropriate caloric intake. Patient will benefit from ongoing participation in cardiac rehab for nutrition education, exercise, and lifestyle modification          Nutrition Goals Re-Evaluation:  Nutrition Goals Re-Evaluation     Row Name 12/19/24 1307             Goals   Current Weight 181 lb 3.5 oz (82.2 kg)       Nutrition Goal Patient to improve diet quality by using the plate method as a guide for meal planning to include lean protein/plant protein, fruits, vegetables, whole grains, nonfat dairy as part of a well-balanced diet.       Comment No recent labs noted. Wt relatively stable since starting cardiac rehab; < 1% wt loss noted.       Expected Outcome Goals in action. Patient with medical history of HIV on treatment, nicotine dependence, CAD, PAD, s/p PCI to left circumflex for NSTEMI in 05/2024. Pt reports consuming baked fish as primary protein source. Also notes frequent intake of salads with meals. Typically consuming ~ 2 meals daily. RD encouraged pt to add snacks between meals to help maintain appropriate caloric intake. Patient will benefit from  ongoing participation in cardiac rehab for nutrition education, exercise, and lifestyle modification          Nutrition Goals Discharge (Final Nutrition Goals Re-Evaluation):  Nutrition Goals Re-Evaluation - 12/19/24 1307       Goals   Current Weight 181 lb 3.5 oz (82.2 kg)    Nutrition Goal Patient to improve diet quality by using the plate method as a guide for meal planning to include lean protein/plant protein, fruits, vegetables, whole grains, nonfat dairy as part of a well-balanced diet.    Comment No recent labs noted. Wt relatively stable since starting cardiac rehab; < 1% wt loss noted.    Expected Outcome Goals in action. Patient with medical history of HIV on treatment, nicotine dependence, CAD, PAD, s/p PCI to left circumflex for NSTEMI in 05/2024. Pt reports consuming baked fish as primary protein source. Also notes frequent intake of salads with meals. Typically consuming ~ 2 meals daily. RD encouraged pt to add snacks between meals to help maintain appropriate caloric intake. Patient will benefit from ongoing participation in cardiac rehab for nutrition education, exercise, and lifestyle modification  Psychosocial: Target Goals: Acknowledge presence or absence of significant depression and/or stress, maximize coping skills, provide positive support system. Participant is able to verbalize types and ability to use techniques and skills needed for reducing stress and depression.  Initial Review & Psychosocial Screening:   Quality of Life Scores:  Scores of 19 and below usually indicate a poorer quality of life in these areas.  A difference of  2-3 points is a clinically meaningful difference.  A difference of 2-3 points in the total score of the Quality of Life Index has been associated with significant improvement in overall quality of life, self-image, physical symptoms, and general health in studies assessing change in quality of life.  PHQ-9: Review Flowsheet   More data exists      11/08/2024 08/23/2024 02/19/2024 09/04/2023 03/12/2023  Depression screen PHQ 2/9  Decreased Interest 0 3 0 0 0  Down, Depressed, Hopeless 0 0 0 0 0  PHQ - 2 Score 0 3 0 0 0  Altered sleeping 3 2 - - -  Tired, decreased energy 0 3 - - -  Change in appetite 0 0 - - -  Feeling bad or failure about yourself  0 1 - - -  Trouble concentrating 0 0 - - -  Moving slowly or fidgety/restless 1 0 - - -  Suicidal thoughts 0 1 - - -  PHQ-9 Score 4 10  - - -  Difficult doing work/chores Not difficult at all Not difficult at all - - -    Details       Data saved with a previous flowsheet row definition        Interpretation of Total Score  Total Score Depression Severity:  1-4 = Minimal depression, 5-9 = Mild depression, 10-14 = Moderate depression, 15-19 = Moderately severe depression, 20-27 = Severe depression   Psychosocial Evaluation and Intervention:   Psychosocial Re-Evaluation:  Psychosocial Re-Evaluation     Row Name 11/15/24 1446 12/13/24 1359 01/10/25 1555         Psychosocial Re-Evaluation   Current issues with None Identified None Identified Current Stress Concerns     Comments -- -- Patryck has only attended 3 cardiac rehab sessions within the 30 days, but has only voiced issues with transportation during exercise at cardiac rehab.     Expected Outcomes -- -- Martavius will voice controlled or decreased psychosocial concerns or stressors by completion of cardiac rehab.     Interventions Relaxation education;Encouraged to attend Cardiac Rehabilitation for the exercise;Stress management education Relaxation education;Encouraged to attend Cardiac Rehabilitation for the exercise;Stress management education Relaxation education;Encouraged to attend Cardiac Rehabilitation for the exercise;Stress management education     Continue Psychosocial Services  Follow up required by staff Follow up required by staff Follow up required by staff        Psychosocial Discharge  (Final Psychosocial Re-Evaluation):  Psychosocial Re-Evaluation - 01/10/25 1555       Psychosocial Re-Evaluation   Current issues with Current Stress Concerns    Comments Elin has only attended 3 cardiac rehab sessions within the 30 days, but has only voiced issues with transportation during exercise at cardiac rehab.    Expected Outcomes Nyeem will voice controlled or decreased psychosocial concerns or stressors by completion of cardiac rehab.    Interventions Relaxation education;Encouraged to attend Cardiac Rehabilitation for the exercise;Stress management education    Continue Psychosocial Services  Follow up required by staff          Vocational Rehabilitation: Provide vocational  rehab assistance to qualifying candidates.   Vocational Rehab Evaluation & Intervention:   Education: Education Goals: Education classes will be provided on a weekly basis, covering required topics. Participant will state understanding/return demonstration of topics presented.     Core Videos: Exercise    Move It!  Clinical staff conducted group or individual video education with verbal and written material and guidebook.  Patient learns the recommended Pritikin exercise program. Exercise with the goal of living a long, healthy life. Some of the health benefits of exercise include controlled diabetes, healthier blood pressure levels, improved cholesterol levels, improved heart and lung capacity, improved sleep, and better body composition. Everyone should speak with their doctor before starting or changing an exercise routine.  Biomechanical Limitations Clinical staff conducted group or individual video education with verbal and written material and guidebook.  Patient learns how biomechanical limitations can impact exercise and how we can mitigate and possibly overcome limitations to have an impactful and balanced exercise routine.  Body Composition Clinical staff conducted group or individual  video education with verbal and written material and guidebook.  Patient learns that body composition (ratio of muscle mass to fat mass) is a key component to assessing overall fitness, rather than body weight alone. Increased fat mass, especially visceral belly fat, can put us  at increased risk for metabolic syndrome, type 2 diabetes, heart disease, and even death. It is recommended to combine diet and exercise (cardiovascular and resistance training) to improve your body composition. Seek guidance from your physician and exercise physiologist before implementing an exercise routine.  Exercise Action Plan Clinical staff conducted group or individual video education with verbal and written material and guidebook.  Patient learns the recommended strategies to achieve and enjoy long-term exercise adherence, including variety, self-motivation, self-efficacy, and positive decision making. Benefits of exercise include fitness, good health, weight management, more energy, better sleep, less stress, and overall well-being.  Medical   Heart Disease Risk Reduction Clinical staff conducted group or individual video education with verbal and written material and guidebook.  Patient learns our heart is our most vital organ as it circulates oxygen, nutrients, white blood cells, and hormones throughout the entire body, and carries waste away. Data supports a plant-based eating plan like the Pritikin Program for its effectiveness in slowing progression of and reversing heart disease. The video provides a number of recommendations to address heart disease.   Metabolic Syndrome and Belly Fat  Clinical staff conducted group or individual video education with verbal and written material and guidebook.  Patient learns what metabolic syndrome is, how it leads to heart disease, and how one can reverse it and keep it from coming back. You have metabolic syndrome if you have 3 of the following 5 criteria: abdominal obesity,  high blood pressure, high triglycerides, low HDL cholesterol, and high blood sugar.  Hypertension and Heart Disease Clinical staff conducted group or individual video education with verbal and written material and guidebook.  Patient learns that high blood pressure, or hypertension, is very common in the [place] . Hypertension is largely due to excessive salt intake, but other important risk factors include being overweight, physical inactivity, drinking too much alcohol, smoking, and not eating enough potassium from fruits and vegetables. High blood pressure is a leading risk factor for heart attack, stroke, congestive heart failure, dementia, kidney failure, and premature death. Long-term effects of excessive salt intake include stiffening of the arteries and thickening of heart muscle and organ damage. Recommendations include ways to reduce hypertension and the  risk of heart disease.  Diseases of Our Time - Focusing on Diabetes Clinical staff conducted group or individual video education with verbal and written material and guidebook.  Patient learns why the best way to stop diseases of our time is prevention, through food and other lifestyle changes. Medicine (such as prescription pills and surgeries) is often only a Band-Aid on the problem, not a long-term solution. Most common diseases of our time include obesity, type 2 diabetes, hypertension, heart disease, and cancer. The Pritikin Program is recommended and has been proven to help reduce, reverse, and/or prevent the damaging effects of metabolic syndrome.  Nutrition   Overview of the Pritikin Eating Plan  Clinical staff conducted group or individual video education with verbal and written material and guidebook.  Patient learns about the Pritikin Eating Plan for disease risk reduction. The Pritikin Eating Plan emphasizes a wide variety of unrefined, minimally-processed carbohydrates, like fruits, vegetables, whole grains, and legumes. Go,  Caution, and Stop food choices are explained. Plant-based and lean animal proteins are emphasized. Rationale provided for low sodium intake for blood pressure control, low added sugars for blood sugar stabilization, and low added fats and oils for coronary artery disease risk reduction and weight management.  Calorie Density  Clinical staff conducted group or individual video education with verbal and written material and guidebook.  Patient learns about calorie density and how it impacts the Pritikin Eating Plan. Knowing the characteristics of the food you choose will help you decide whether those foods will lead to weight gain or weight loss, and whether you want to consume more or less of them. Weight loss is usually a side effect of the Pritikin Eating Plan because of its focus on low calorie-dense foods.  Label Reading  Clinical staff conducted group or individual video education with verbal and written material and guidebook.  Patient learns about the Pritikin recommended label reading guidelines and corresponding recommendations regarding calorie density, added sugars, sodium content, and whole grains.  Dining Out - Part 1  Clinical staff conducted group or individual video education with verbal and written material and guidebook.  Patient learns that restaurant meals can be sabotaging because they can be so high in calories, fat, sodium, and/or sugar. Patient learns recommended strategies on how to positively address this and avoid unhealthy pitfalls.  Facts on Fats  Clinical staff conducted group or individual video education with verbal and written material and guidebook.  Patient learns that lifestyle modifications can be just as effective, if not more so, as many medications for lowering your risk of heart disease. A Pritikin lifestyle can help to reduce your risk of inflammation and atherosclerosis (cholesterol build-up, or plaque, in the artery walls). Lifestyle interventions such as  dietary choices and physical activity address the cause of atherosclerosis. A review of the types of fats and their impact on blood cholesterol levels, along with dietary recommendations to reduce fat intake is also included.  Nutrition Action Plan  Clinical staff conducted group or individual video education with verbal and written material and guidebook.  Patient learns how to incorporate Pritikin recommendations into their lifestyle. Recommendations include planning and keeping personal health goals in mind as an important part of their success.  Healthy Mind-Set    Healthy Minds, Bodies, Hearts  Clinical staff conducted group or individual video education with verbal and written material and guidebook.  Patient learns how to identify when they are stressed. Video will discuss the impact of that stress, as well as the many benefits  of stress management. Patient will also be introduced to stress management techniques. The way we think, act, and feel has an impact on our hearts.  How Our Thoughts Can [facility]  Clinical staff conducted group or individual video education with verbal and written material and guidebook.  Patient learns that negative thoughts can cause depression and anxiety. This can result in negative lifestyle behavior and serious health problems. Cognitive behavioral therapy is an effective method to help control our thoughts in order to change and improve our emotional outlook.  Additional Videos:  Exercise    Improving Performance  Clinical staff conducted group or individual video education with verbal and written material and guidebook.  Patient learns to use a non-linear approach by alternating intensity levels and lengths of time spent exercising to help burn more calories and lose more body fat. Cardiovascular exercise helps improve heart health, metabolism, hormonal balance, blood sugar control, and recovery from fatigue. Resistance training improves strength,  endurance, balance, coordination, reaction time, metabolism, and muscle mass. Flexibility exercise improves circulation, posture, and balance. Seek guidance from your physician and exercise physiologist before implementing an exercise routine and learn your capabilities and proper form for all exercise.  Introduction to Yoga  Clinical staff conducted group or individual video education with verbal and written material and guidebook.  Patient learns about yoga, a discipline of the coming together of mind, breath, and body. The benefits of yoga include improved flexibility, improved range of motion, better posture and core strength, increased lung function, weight loss, and positive self-image. Yogas heart health benefits include lowered blood pressure, healthier heart rate, decreased cholesterol and triglyceride levels, improved immune function, and reduced stress. Seek guidance from your physician and exercise physiologist before implementing an exercise routine and learn your capabilities and proper form for all exercise.  Medical   Aging: Enhancing Your Quality of Life  Clinical staff conducted group or individual video education with verbal and written material and guidebook.  Patient learns key strategies and recommendations to stay in good physical health and enhance quality of life, such as prevention strategies, having an advocate, securing a Health Care Proxy and Power of Attorney, and keeping a list of medications and system for tracking them. It also discusses how to avoid risk for bone loss.  Biology of Weight Control  Clinical staff conducted group or individual video education with verbal and written material and guidebook.  Patient learns that weight gain occurs because we consume more calories than we burn (eating more, moving less). Even if your body weight is normal, you may have higher ratios of fat compared to muscle mass. Too much body fat puts you at increased risk for  cardiovascular disease, heart attack, stroke, type 2 diabetes, and obesity-related cancers. In addition to exercise, following the Pritikin Eating Plan can help reduce your risk.  Decoding Lab Results  Clinical staff conducted group or individual video education with verbal and written material and guidebook.  Patient learns that lab test reflects one measurement whose values change over time and are influenced by many factors, including medication, stress, sleep, exercise, food, hydration, pre-existing medical conditions, and more. It is recommended to use the knowledge from this video to become more involved with your lab results and evaluate your numbers to speak with your doctor.   Diseases of Our Time - Overview  Clinical staff conducted group or individual video education with verbal and written material and guidebook.  Patient learns that according to the CDC, 50% to 70%  of chronic diseases (such as obesity, type 2 diabetes, elevated lipids, hypertension, and heart disease) are avoidable through lifestyle improvements including healthier food choices, listening to satiety cues, and increased physical activity.  Sleep Disorders Clinical staff conducted group or individual video education with verbal and written material and guidebook.  Patient learns how good quality and duration of sleep are important to overall health and well-being. Patient also learns about sleep disorders and how they impact health along with recommendations to address them, including discussing with a physician.  Nutrition  Dining Out - Part 2 Clinical staff conducted group or individual video education with verbal and written material and guidebook.  Patient learns how to plan ahead and communicate in order to maximize their dining experience in a healthy and nutritious manner. Included are recommended food choices based on the type of restaurant the patient is visiting.   Fueling a Banker  conducted group or individual video education with verbal and written material and guidebook.  There is a strong connection between our food choices and our health. Diseases like obesity and type 2 diabetes are very prevalent and are in large-part due to lifestyle choices. The Pritikin Eating Plan provides plenty of food and hunger-curbing satisfaction. It is easy to follow, affordable, and helps reduce health risks.  Menu Workshop  Clinical staff conducted group or individual video education with verbal and written material and guidebook.  Patient learns that restaurant meals can sabotage health goals because they are often packed with calories, fat, sodium, and sugar. Recommendations include strategies to plan ahead and to communicate with the manager, chef, or server to help order a healthier meal.  Planning Your Eating Strategy  Clinical staff conducted group or individual video education with verbal and written material and guidebook.  Patient learns about the Pritikin Eating Plan and its benefit of reducing the risk of disease. The Pritikin Eating Plan does not focus on calories. Instead, it emphasizes high-quality, nutrient-rich foods. By knowing the characteristics of the foods, we choose, we can determine their calorie density and make informed decisions.  Targeting Your Nutrition Priorities  Clinical staff conducted group or individual video education with verbal and written material and guidebook.  Patient learns that lifestyle habits have a tremendous impact on disease risk and progression. This video provides eating and physical activity recommendations based on your personal health goals, such as reducing LDL cholesterol, losing weight, preventing or controlling type 2 diabetes, and reducing high blood pressure.  Vitamins and Minerals  Clinical staff conducted group or individual video education with verbal and written material and guidebook.  Patient learns different ways to obtain  key vitamins and minerals, including through a recommended healthy diet. It is important to discuss all supplements you take with your doctor.   Healthy Mind-Set    Smoking Cessation  Clinical staff conducted group or individual video education with verbal and written material and guidebook.  Patient learns that cigarette smoking and tobacco addiction pose a serious health risk which affects millions of people. Stopping smoking will significantly reduce the risk of heart disease, lung disease, and many forms of cancer. Recommended strategies for quitting are covered, including working with your doctor to develop a successful plan.  Culinary   Becoming a Set Designer conducted group or individual video education with verbal and written material and guidebook.  Patient learns that cooking at home can be healthy, cost-effective, quick, and puts them in control. Keys to cooking healthy recipes will  include looking at your recipe, assessing your equipment needs, planning ahead, making it simple, choosing cost-effective seasonal ingredients, and limiting the use of added fats, salts, and sugars.  Cooking - Breakfast and Snacks  Clinical staff conducted group or individual video education with verbal and written material and guidebook.  Patient learns how important breakfast is to satiety and nutrition through the entire day. Recommendations include key foods to eat during breakfast to help stabilize blood sugar levels and to prevent overeating at meals later in the day. Planning ahead is also a key component.  Cooking - Educational Psychologist conducted group or individual video education with verbal and written material and guidebook.  Patient learns eating strategies to improve overall health, including an approach to cook more at home. Recommendations include thinking of animal protein as a side on your plate rather than center stage and focusing instead on lower calorie  dense options like vegetables, fruits, whole grains, and plant-based proteins, such as beans. Making sauces in large quantities to freeze for later and leaving the skin on your vegetables are also recommended to maximize your experience.  Cooking - Healthy Salads and Dressing Clinical staff conducted group or individual video education with verbal and written material and guidebook.  Patient learns that vegetables, fruits, whole grains, and legumes are the foundations of the Pritikin Eating Plan. Recommendations include how to incorporate each of these in flavorful and healthy salads, and how to create homemade salad dressings. Proper handling of ingredients is also covered. Cooking - Soups and [employer] - Soups and Desserts Clinical staff conducted group or individual video education with verbal and written material and guidebook.  Patient learns that Pritikin soups and desserts make for easy, nutritious, and delicious snacks and meal components that are low in sodium, fat, sugar, and calorie density, while high in vitamins, minerals, and filling fiber. Recommendations include simple and healthy ideas for soups and desserts.   Overview     The Pritikin Solution Program Overview Clinical staff conducted group or individual video education with verbal and written material and guidebook.  Patient learns that the results of the Pritikin Program have been documented in more than 100 articles published in peer-reviewed journals, and the benefits include reducing risk factors for (and, in some cases, even reversing) high cholesterol, high blood pressure, type 2 diabetes, obesity, and more! An overview of the three key pillars of the Pritikin Program will be covered: eating well, doing regular exercise, and having a healthy mind-set.  WORKSHOPS  Exercise: Exercise Basics: Building Your Action Plan Clinical staff led group instruction and group discussion with PowerPoint presentation and patient  guidebook. To enhance the learning environment the use of posters, models and videos may be added. At the conclusion of this workshop, patients will comprehend the difference between physical activity and exercise, as well as the benefits of incorporating both, into their routine. Patients will understand the FITT (Frequency, Intensity, Time, and Type) principle and how to use it to build an exercise action plan. In addition, safety concerns and other considerations for exercise and cardiac rehab will be addressed by the presenter. The purpose of this lesson is to promote a comprehensive and effective weekly exercise routine in order to improve patients overall level of fitness.   Managing Heart Disease: Your Path to a Healthier Heart Clinical staff led group instruction and group discussion with PowerPoint presentation and patient guidebook. To enhance the learning environment the use of posters, models and videos may  be added.At the conclusion of this workshop, patients will understand the anatomy and physiology of the heart. Additionally, they will understand how Pritikins three pillars impact the risk factors, the progression, and the management of heart disease.  The purpose of this lesson is to provide a high-level overview of the heart, heart disease, and how the Pritikin lifestyle positively impacts risk factors.  Exercise Biomechanics Clinical staff led group instruction and group discussion with PowerPoint presentation and patient guidebook. To enhance the learning environment the use of posters, models and videos may be added. Patients will learn how the structural parts of their bodies function and how these functions impact their daily activities, movement, and exercise. Patients will learn how to promote a neutral spine, learn how to manage pain, and identify ways to improve their physical movement in order to promote healthy living. The purpose of this lesson is to expose patients  to common physical limitations that impact physical activity. Participants will learn practical ways to adapt and manage aches and pains, and to minimize their effect on regular exercise. Patients will learn how to maintain good posture while sitting, walking, and lifting.  Balance Training and Fall Prevention  Clinical staff led group instruction and group discussion with PowerPoint presentation and patient guidebook. To enhance the learning environment the use of posters, models and videos may be added. At the conclusion of this workshop, patients will understand the importance of their sensorimotor skills (vision, proprioception, and the vestibular system) in maintaining their ability to balance as they age. Patients will apply a variety of balancing exercises that are appropriate for their current level of function. Patients will understand the common causes for poor balance, possible solutions to these problems, and ways to modify their physical environment in order to minimize their fall risk. The purpose of this lesson is to teach patients about the importance of maintaining balance as they age and ways to minimize their risk of falling.  WORKSHOPS   Nutrition:  Fueling a Ship Broker led group instruction and group discussion with PowerPoint presentation and patient guidebook. To enhance the learning environment the use of posters, models and videos may be added. Patients will review the foundational principles of the Pritikin Eating Plan and understand what constitutes a serving size in each of the food groups. Patients will also learn Pritikin-friendly foods that are better choices when away from home and review make-ahead meal and snack options. Calorie density will be reviewed and applied to three nutrition priorities: weight maintenance, weight loss, and weight gain. The purpose of this lesson is to reinforce (in a group setting) the key concepts around what patients are  recommended to eat and how to apply these guidelines when away from home by planning and selecting Pritikin-friendly options. Patients will understand how calorie density may be adjusted for different weight management goals.  Mindful Eating  Clinical staff led group instruction and group discussion with PowerPoint presentation and patient guidebook. To enhance the learning environment the use of posters, models and videos may be added. Patients will briefly review the concepts of the Pritikin Eating Plan and the importance of low-calorie dense foods. The concept of mindful eating will be introduced as well as the importance of paying attention to internal hunger signals. Triggers for non-hunger eating and techniques for dealing with triggers will be explored. The purpose of this lesson is to provide patients with the opportunity to review the basic principles of the Pritikin Eating Plan, discuss the value of eating mindfully  and how to measure internal cues of hunger and fullness using the Hunger Scale. Patients will also discuss reasons for non-hunger eating and learn strategies to use for controlling emotional eating.  Targeting Your Nutrition Priorities Clinical staff led group instruction and group discussion with PowerPoint presentation and patient guidebook. To enhance the learning environment the use of posters, models and videos may be added. Patients will learn how to determine their genetic susceptibility to disease by reviewing their family history. Patients will gain insight into the importance of diet as part of an overall healthy lifestyle in mitigating the impact of genetics and other environmental insults. The purpose of this lesson is to provide patients with the opportunity to assess their personal nutrition priorities by looking at their family history, their own health history and current risk factors. Patients will also be able to discuss ways of prioritizing and modifying the Pritikin  Eating Plan for their highest risk areas  Menu  Clinical staff led group instruction and group discussion with PowerPoint presentation and patient guidebook. To enhance the learning environment the use of posters, models and videos may be added. Using menus brought in from [employer], or printed from [employer], patients will apply the Pritikin dining out guidelines that were presented in the [employer] video. Patients will also be able to practice these guidelines in a variety of provided scenarios. The purpose of this lesson is to provide patients with the opportunity to practice hands-on learning of the Pritikin Dining Out guidelines with actual menus and practice scenarios.  Label Reading Clinical staff led group instruction and group discussion with PowerPoint presentation and patient guidebook. To enhance the learning environment the use of posters, models and videos may be added. Patients will review and discuss the Pritikin label reading guidelines presented in Pritikins Label Reading Educational series video. Using fool labels brought in from local grocery stores and markets, patients will apply the label reading guidelines and determine if the packaged food meet the Pritikin guidelines. The purpose of this lesson is to provide patients with the opportunity to review, discuss, and practice hands-on learning of the Pritikin Label Reading guidelines with actual packaged food labels. Cooking School  Pritikins [employer] are designed to teach patients ways to prepare quick, simple, and affordable recipes at home. The importance of nutritions role in chronic disease risk reduction is reflected in its emphasis in the overall Pritikin program. By learning how to prepare essential core Pritikin Eating Plan recipes, patients will increase control over what they eat; be able to customize the flavor of foods without the use of added salt, sugar, or fat; and  improve the quality of the food they consume. By learning a set of core recipes which are easily assembled, quickly prepared, and affordable, patients are more likely to prepare more healthy foods at home. These workshops focus on convenient breakfasts, simple entres, side dishes, and desserts which can be prepared with minimal effort and are consistent with nutrition recommendations for cardiovascular risk reduction. Cooking [employer] are taught by a armed forces logistics/support/administrative officer (RD) who has been trained by the [employer]. The chef or RD has a clear understanding of the importance of minimizing - if not completely eliminating - added fat, sugar, and sodium in recipes. Throughout the series of Cooking School Workshop sessions, patients will learn about healthy ingredients and efficient methods of cooking to build confidence in their capability to prepare    [employer] weekly topics:  Adding Flavor- Sodium-Free  Fast and Healthy Breakfasts  Powerhouse Plant-Based Proteins  Satisfying Salads and Dressings  Simple Sides and Sauces  International Cuisine-Spotlight on the Blue Zones  Delicious Desserts  Savory Soups  Efficiency Cooking - Meals in a Snap  Tasty Appetizers and Snacks  Comforting Weekend Breakfasts  One-Pot Wonders   Fast Evening Meals  Landscape Architect Your Pritikin Plate  WORKSHOPS   Healthy Mindset (Psychosocial):  Focused Goals, Sustainable Changes Clinical staff led group instruction and group discussion with PowerPoint presentation and patient guidebook. To enhance the learning environment the use of posters, models and videos may be added. Patients will be able to apply effective goal setting strategies to establish at least one personal goal, and then take consistent, meaningful action toward that goal. They will learn to identify common barriers to achieving personal goals and develop strategies to overcome them. Patients will  also gain an understanding of how our mind-set can impact our ability to achieve goals and the importance of cultivating a positive and growth-oriented mind-set. The purpose of this lesson is to provide patients with a deeper understanding of how to set and achieve personal goals, as well as the tools and strategies needed to overcome common obstacles which may arise along the way.  From Head to Heart: The Power of a Healthy Outlook  Clinical staff led group instruction and group discussion with PowerPoint presentation and patient guidebook. To enhance the learning environment the use of posters, models and videos may be added. Patients will be able to recognize and describe the impact of emotions and mood on physical health. They will discover the importance of self-care and explore self-care practices which may work for them. Patients will also learn how to utilize the 4 Cs to cultivate a healthier outlook and better manage stress and challenges. The purpose of this lesson is to demonstrate to patients how a healthy outlook is an essential part of maintaining good health, especially as they continue their cardiac rehab journey.  Healthy Sleep for a Healthy Heart Clinical staff led group instruction and group discussion with PowerPoint presentation and patient guidebook. To enhance the learning environment the use of posters, models and videos may be added. At the conclusion of this workshop, patients will be able to demonstrate knowledge of the importance of sleep to overall health, well-being, and quality of life. They will understand the symptoms of, and treatments for, common sleep disorders. Patients will also be able to identify daytime and nighttime behaviors which impact sleep, and they will be able to apply these tools to help manage sleep-related challenges. The purpose of this lesson is to provide patients with a general overview of sleep and outline the importance of quality sleep. Patients will  learn about a few of the most common sleep disorders. Patients will also be introduced to the concept of sleep hygiene, and discover ways to self-manage certain sleeping problems through simple daily behavior changes. Finally, the workshop will motivate patients by clarifying the links between quality sleep and their goals of heart-healthy living.   Recognizing and Reducing Stress Clinical staff led group instruction and group discussion with PowerPoint presentation and patient guidebook. To enhance the learning environment the use of posters, models and videos may be added. At the conclusion of this workshop, patients will be able to understand the types of stress reactions, differentiate between acute and chronic stress, and recognize the impact that chronic stress has on their health. They will also be able to apply  different coping mechanisms, such as reframing negative self-talk. Patients will have the opportunity to practice a variety of stress management techniques, such as deep abdominal breathing, progressive muscle relaxation, and/or guided imagery.  The purpose of this lesson is to educate patients on the role of stress in their lives and to provide healthy techniques for coping with it.  Learning Barriers/Preferences:   Education Topics:  Knowledge Questionnaire Score:   Core Components/Risk Factors/Patient Goals at Admission:   Core Components/Risk Factors/Patient Goals Review:   Goals and Risk Factor Review     Row Name 11/15/24 1448 12/13/24 1400 01/10/25 1602         Core Components/Risk Factors/Patient Goals Review   Personal Goals Review Tobacco Cessation;Lipids Tobacco Cessation;Lipids Tobacco Cessation;Lipids     Review Aundre' orientation was on 11/08/24 and he will be starting his first day of cardiac rehab exercise classes tomorrow (11/16/24). Kyrie is doing well with exercise at cardiac rehab since his 11/16/24 start. Vital signs have been stable. Lion is doing well  with exercise at cardiac rehab since his 11/16/24 start. VSS.     Expected Outcomes Ronal will begin and continue to particpate in cardiac rehab for exercise nutrition and lifestyle modifications. Sparsh will continue to particpate in cardiac rehab for exercise, nutrition and lifestyle modifications. Joie will continue to participate in cardiac rehab for exercise, nutrition and lifestyle modifications.        Core Components/Risk Factors/Patient Goals at Discharge (Final Review):   Goals and Risk Factor Review - 01/10/25 1602       Core Components/Risk Factors/Patient Goals Review   Personal Goals Review Tobacco Cessation;Lipids    Review Dominyck is doing well with exercise at cardiac rehab since his 11/16/24 start. VSS.    Expected Outcomes Fenris will continue to participate in cardiac rehab for exercise, nutrition and lifestyle modifications.          ITP Comments:  ITP Comments     Row Name 11/15/24 1445 12/13/24 1358 01/10/25 1553       ITP Comments 30 Day ITP Review. Gregroy is to start his first day of cardiac rehab exercise class tomorrow (11/16/24) 30 Day ITP Review. Lavoris started cardiac rehab on 11/16/24 and is tolerating exercise well when in attendance 30 Day ITP Review. Rosalie has had transportation issues affecting his ability to participate in cardiac rehab, but is otherwise tolerating exercise well when in attendance        Comments: See ITP comments    [1]  Current Outpatient Medications:    aspirin  EC 81 MG tablet, Take 1 tablet (81 mg total) by mouth daily. Swallow whole., Disp: 30 tablet, Rfl: 12   bictegravir-emtricitabine -tenofovir  AF (BIKTARVY ) 50-200-25 MG TABS tablet, Take 1 tablet by mouth daily., Disp: 30 tablet, Rfl: 6   carvedilol  (COREG ) 6.25 MG tablet, Take 1 tablet (6.25 mg total) by mouth 2 (two) times daily. (Patient taking differently: Take 6.25 mg by mouth daily.), Disp: 180 tablet, Rfl: 3   ezetimibe  (ZETIA ) 10 MG tablet, Take 1 tablet (10 mg total) by  mouth daily., Disp: 90 tablet, Rfl: 3   nitroGLYCERIN  (NITROSTAT ) 0.4 MG SL tablet, Place 1 tablet (0.4 mg total) under the tongue every 5 (five) minutes as needed for chest pain., Disp: 25 tablet, Rfl: 4   prasugrel  (EFFIENT ) 10 MG TABS tablet, Take 1 tablet (10 mg total) by mouth daily., Disp: 90 tablet, Rfl: 3   rosuvastatin  (CRESTOR ) 40 MG tablet, Take 1 tablet (40 mg total) by mouth daily., Disp:  90 tablet, Rfl: 3 [2]  Social History Tobacco Use  Smoking Status Every Day   Current packs/day: 0.50   Average packs/day: 0.5 packs/day for 30.0 years (15.0 ttl pk-yrs)   Types: Cigarettes  Smokeless Tobacco Never

## 2025-01-11 ENCOUNTER — Encounter (HOSPITAL_COMMUNITY)

## 2025-01-13 ENCOUNTER — Encounter (HOSPITAL_COMMUNITY)

## 2025-01-13 ENCOUNTER — Other Ambulatory Visit (HOSPITAL_BASED_OUTPATIENT_CLINIC_OR_DEPARTMENT_OTHER): Payer: Self-pay

## 2025-01-16 ENCOUNTER — Encounter (HOSPITAL_COMMUNITY)

## 2025-01-18 ENCOUNTER — Encounter (HOSPITAL_COMMUNITY)

## 2025-01-18 NOTE — Addendum Note (Signed)
 Encounter addended by: Orval Alan HERO, RD on: 01/18/2025 4:28 PM  Actions taken: Flowsheet data copied forward, Flowsheet accepted

## 2025-01-20 ENCOUNTER — Encounter (HOSPITAL_COMMUNITY)

## 2025-01-20 ENCOUNTER — Telehealth (HOSPITAL_COMMUNITY): Payer: Self-pay

## 2025-01-20 NOTE — Telephone Encounter (Signed)
 Welfare check.  Pt stated that he had lost power and was unable to call out from today's appointment, but expects to return to cardiac rehab at the next class on Monday 01/23/25.

## 2025-01-23 ENCOUNTER — Encounter (HOSPITAL_COMMUNITY)

## 2025-01-24 ENCOUNTER — Ambulatory Visit (HOSPITAL_COMMUNITY)

## 2025-01-24 ENCOUNTER — Ambulatory Visit

## 2025-01-25 ENCOUNTER — Encounter (HOSPITAL_COMMUNITY)

## 2025-01-27 ENCOUNTER — Encounter (HOSPITAL_COMMUNITY)

## 2025-01-30 ENCOUNTER — Encounter (HOSPITAL_COMMUNITY)

## 2025-02-01 ENCOUNTER — Encounter (HOSPITAL_COMMUNITY)

## 2025-02-21 ENCOUNTER — Ambulatory Visit: Admitting: Internal Medicine
